# Patient Record
Sex: Female | Born: 1970
Health system: Southern US, Community
[De-identification: ages and names within clinical notes are randomized; demographics above are authoritative.]

## PROBLEM LIST (undated history)

## (undated) DIAGNOSIS — E559 Vitamin D deficiency, unspecified: Secondary | ICD-10-CM

## (undated) DIAGNOSIS — M069 Rheumatoid arthritis, unspecified: Secondary | ICD-10-CM

## (undated) DIAGNOSIS — D649 Anemia, unspecified: Secondary | ICD-10-CM

## (undated) DIAGNOSIS — M797 Fibromyalgia: Secondary | ICD-10-CM

## (undated) DIAGNOSIS — M1990 Unspecified osteoarthritis, unspecified site: Secondary | ICD-10-CM

## (undated) DIAGNOSIS — Z9889 Other specified postprocedural states: Secondary | ICD-10-CM

## (undated) DIAGNOSIS — J189 Pneumonia, unspecified organism: Secondary | ICD-10-CM

## (undated) DIAGNOSIS — I10 Essential (primary) hypertension: Secondary | ICD-10-CM

## (undated) DIAGNOSIS — R112 Nausea with vomiting, unspecified: Secondary | ICD-10-CM

## (undated) DIAGNOSIS — F32A Depression, unspecified: Secondary | ICD-10-CM

## (undated) DIAGNOSIS — F419 Anxiety disorder, unspecified: Secondary | ICD-10-CM

## (undated) HISTORY — DX: Fibromyalgia: M79.7

## (undated) HISTORY — DX: Rheumatoid arthritis, unspecified: M06.9

## (undated) HISTORY — DX: Vitamin D deficiency, unspecified: E55.9

## (undated) HISTORY — PX: TENNIS ELBOW RELEASE/NIRSCHEL PROCEDURE: SHX6651

## (undated) HISTORY — PX: CARPAL TUNNEL RELEASE: SHX101

## (undated) HISTORY — DX: Unspecified osteoarthritis, unspecified site: M19.90

## (undated) HISTORY — PX: HERNIA REPAIR: SHX51

## (undated) HISTORY — PX: SHOULDER SURGERY: SHX246

---

## 1997-04-03 HISTORY — PX: CHOLECYSTECTOMY: SHX55

## 2001-04-03 HISTORY — PX: GASTRIC BYPASS: SHX52

## 2005-12-23 ENCOUNTER — Ambulatory Visit: Payer: Self-pay | Admitting: Cardiology

## 2006-04-03 HISTORY — PX: PANNICULECTOMY: SHX5360

## 2007-03-29 ENCOUNTER — Emergency Department (HOSPITAL_COMMUNITY): Admission: EM | Admit: 2007-03-29 | Discharge: 2007-03-29 | Payer: Self-pay | Admitting: Emergency Medicine

## 2008-04-03 HISTORY — PX: APPENDECTOMY: SHX54

## 2009-12-05 ENCOUNTER — Emergency Department (HOSPITAL_COMMUNITY): Admission: EM | Admit: 2009-12-05 | Discharge: 2009-12-06 | Payer: Self-pay | Admitting: Emergency Medicine

## 2009-12-05 ENCOUNTER — Encounter: Payer: Self-pay | Admitting: Cardiology

## 2009-12-05 ENCOUNTER — Ambulatory Visit: Payer: Self-pay | Admitting: Advanced Practice Midwife

## 2009-12-05 ENCOUNTER — Ambulatory Visit: Payer: Self-pay | Admitting: Vascular Surgery

## 2009-12-08 ENCOUNTER — Encounter: Payer: Self-pay | Admitting: Cardiology

## 2009-12-30 ENCOUNTER — Ambulatory Visit: Payer: Self-pay | Admitting: Cardiology

## 2009-12-30 DIAGNOSIS — D509 Iron deficiency anemia, unspecified: Secondary | ICD-10-CM | POA: Insufficient documentation

## 2009-12-30 DIAGNOSIS — R930 Abnormal findings on diagnostic imaging of skull and head, not elsewhere classified: Secondary | ICD-10-CM | POA: Insufficient documentation

## 2009-12-30 DIAGNOSIS — R0989 Other specified symptoms and signs involving the circulatory and respiratory systems: Secondary | ICD-10-CM

## 2009-12-30 DIAGNOSIS — R0609 Other forms of dyspnea: Secondary | ICD-10-CM

## 2009-12-31 ENCOUNTER — Telehealth (INDEPENDENT_AMBULATORY_CARE_PROVIDER_SITE_OTHER): Payer: Self-pay | Admitting: *Deleted

## 2010-01-03 ENCOUNTER — Encounter: Payer: Self-pay | Admitting: Cardiology

## 2010-01-04 ENCOUNTER — Encounter: Payer: Self-pay | Admitting: Cardiology

## 2010-01-13 ENCOUNTER — Telehealth (INDEPENDENT_AMBULATORY_CARE_PROVIDER_SITE_OTHER): Payer: Self-pay | Admitting: *Deleted

## 2010-01-13 ENCOUNTER — Encounter: Payer: Self-pay | Admitting: Cardiology

## 2010-01-24 ENCOUNTER — Encounter: Payer: Self-pay | Admitting: Cardiology

## 2010-02-01 ENCOUNTER — Ambulatory Visit: Payer: Self-pay | Admitting: Cardiology

## 2010-02-01 DIAGNOSIS — E559 Vitamin D deficiency, unspecified: Secondary | ICD-10-CM

## 2010-02-01 DIAGNOSIS — E538 Deficiency of other specified B group vitamins: Secondary | ICD-10-CM | POA: Insufficient documentation

## 2010-05-03 NOTE — Progress Notes (Signed)
Summary: anemia follow up   Phone Note Outgoing Call   Summary of Call: Spoke with pt this morning.  Stated she was still feeling tired, but maybe a little better.   Now has another problem with a hernia and is going to see MD this afternoon.  She has not gotten labs yet, but will go today or tomorrow.   Has follow up scheduled for 10/28.  Hoover Brunette, LPN  January 13, 2010 1:39 PM   Follow-up for Phone Call        OK Follow-up by: Lewayne Bunting, MD, [facility],  January 14, 2010 11:54 AM

## 2010-05-03 NOTE — Miscellaneous (Signed)
Summary: Orders Update - Vit D, intact PTH level  Clinical Lists Changes  Orders: Added new Test order of T- * Misc. Laboratory test [phone]) - Signed

## 2010-05-03 NOTE — Assessment & Plan Note (Signed)
Summary: f67m  --agh   Visit Type:  Follow-up Primary Provider:  Margo Hammond   History of Present Illness: the patient is a 40 year old female who was referred initially for history of dyspnea. The patient was diagnosed with severe anemia and multiple micro-nutritional deficiencies including vitamin D. and vitamin B12 deficiency. The patient was given intravenous iron on 2 separate occasions. Her hemoglobin is now 11.2. She feels dramatically improved. Her symptoms of headache and ice craving have disappeared. Her nutritional deficiencies or the setting of a prior Roux-en-Y gastric bypass surgery without adequate vitamin and iron supplementation. The patient has not taken Vitron C., vitamin B12 injections and is status post intravenous iron. Her PTH level was within normal limits. Her vitamin D3. level remains low and patient is taking Caltrate 2 times a day.  The patient is also scheduled for hernia repair due to ongoing symptoms of abdominal pain.  Preventive Screening-Counseling & Management  Alcohol-Tobacco     Smoking Status: never  Current Medications (verified): 1)  Caltrate 600+d 600-400 Mg-Unit Tabs (Calcium Carbonate-Vitamin D) .... Take 1 Tablet By Mouth Two Times A Day 2)  Valtrex 1 Gm Tabs (Valacyclovir Hcl) .... Take 1 Tablet By Mouth Once A Day Use As Directed 3)  Maxzide-25 37.5-25 Mg Tabs (Triamterene-Hctz) .... Take 1 Tablet By Mouth Once A Day 4)  Vitamin C 500 Mg Tabs (Ascorbic Acid) .... Take 1 Tablet By Mouth Once A Day 5)  Folic Acid 1 Mg Tabs (Folic Acid) .... Take 1 Tablet By Mouth Once A Day 6)  Centrum Silver  Tabs (Multiple Vitamins-Minerals) .... Take 1 Tablet By Mouth Once A Day 7)  Vitron-C 200-125 Mg Tabs (Ferrous Fumarate-Vitamin C) .... Take 1 Tablet By Mouth Once A Day 8)  Cyanocobalamin 1000 Mcg/ml Soln (Cyanocobalamin) .... Inject 1ml Daily X 5 Days (Monday - Friday This Week), Then 1ml Every Week X 4 Weeks, Then  Call Office 9)  Syringes .... Use As Directed  For Vitamin B12 Injections  Allergies (verified): No Known Drug Allergies  Comments:  Nurse/Medical Assistant: The patient's medications and allergies were verbally reviewed with the patient and were updated in the Medication and Allergy Lists.  Past History:  Past Surgical History: Last updated: 12/30/2009 Laparoscopic gastric bypass @ [facility] 2003 Abdomicoplasty 12-2005  Family History: Last updated: 12/30/2009 Father is alive 72 yrs arthritis,hypertension but still working fulltime. Amanda Hammond is alive 47 yrs obesity,DM,HTN Siblings: 2 brothers living with HTn  Social History: Last updated: 12/30/2009 Married  Alcohol Use - no Drug Use - no  Risk Factors: Smoking Status: never (02/01/2010)  Past Medical History: Pulmonary embolus HTN with increased weight Abdominal pain Shortness of breath Vitamin D deficiency Vitamin B12 deficiency Severe iron deficiency anemia requiring intravenous iron  Review of Systems  The patient denies fatigue, malaise, fever, weight gain/loss, vision loss, decreased hearing, hoarseness, chest pain, palpitations, shortness of breath, prolonged cough, wheezing, sleep apnea, coughing up blood, abdominal pain, blood in stool, nausea, vomiting, diarrhea, heartburn, incontinence, blood in urine, muscle weakness, joint pain, leg swelling, rash, skin lesions, headache, fainting, dizziness, depression, anxiety, enlarged lymph nodes, easy bruising or bleeding, and environmental allergies.    Vital Signs:  Patient profile:   40 year old female Height:      66 inches Weight:      213 pounds Pulse rate:   73 / minute BP sitting:   153 / 97  (left arm) Cuff size:   large  Vitals Entered By: Amanda Hammond (February 01, 2010  8:46 AM)  Serial Vital Signs/Assessments:  Time      Position  BP       Pulse  Resp  Temp     By 8:48 AM             151/86   69                    Amanda Hammond  Comments: 8:48 AM recheck large cuff/left arm By: Amanda Hammond    Physical Exam  Additional Exam:  General: well-nourished appearing white female head: Normocephalic and atraumatic eyes PERRLA/EOMI intact, conjunctiva and lids normal nose: No deformity or lesions mouth normal dentition, normal posterior pharynx neck: Supple, no JVD.  No masses, thyromegaly or abnormal cervical nodes lungs: Normal breath sounds bilaterally without wheezing.  Normal percussion heart: regular rate and rhythm with normal S1 and S2, no S3 or S4.  PMI is normal.  No pathological murmurs abdomen: Normal bowel sounds, abdomen is soft and nontender without masses, organomegaly or hernias noted.  No hepatosplenomegaly musculoskeletal: Back normal, normal gait muscle strength and tone normal pulsus: Pulse is normal in all 4 extremities Extremities: No peripheral pitting edema neurologic: Alert and oriented x 3 skin: Intact without lesions or rashes cervical nodes: No significant adenopathy psychologic: Normal affect    Impression & Recommendations:  Problem # 1:  ANEMIA, IRON DEFICIENCY (ICD-280.9) iron deficiency is dramatically improved resolution of symptoms however MCV remains low as well as a markedly increased RDW suggesting residual iron deficiency anemia, continue Fe supplementation. The patient may need more IV iron. Ferritin to be followed closely.  She will follow up with Dr. Margo Hammond as well as the Amanda Hammond to check her iron studies.  Problem # 2:  OTHER DYSPNEA AND RESPIRATORY ABNORMALITIES (ICD-786.09) resolving secondary to iron deficiency Her updated medication list for this problem includes:    Maxzide-25 37.5-25 Mg Tabs (Triamterene-hctz) .Marland Kitchen... Take 1 tablet by mouth once a day  Problem # 3:  B12 DEFICIENCY (ICD-266.2) patient is receiving hydroxocobalamin injections.  Problem # 4:  VITAMIN D DEFICIENCY (ICD-268.9) continue vitamin D q.d. supplementation. Follow up with Dr. Margo Hammond.  Patient Instructions: 1)  Decrease Caltrate to two times  a day  2)  Vitron C - buy otc 3)  Follow up as needed

## 2010-05-03 NOTE — Letter (Signed)
Summary: Internal Other/ PATIENT HISTORY FORM  Internal Other/ PATIENT HISTORY FORM   Imported By: Dorise Hiss 01/06/2010 14:51:44  _____________________________________________________________________  External Attachment:    Type:   Image     Comment:   External Document

## 2010-05-03 NOTE — Letter (Signed)
Summary: External Correspondence/ [facility]  External Correspondence/ [facility]   Imported By: Dorise Hiss 12/16/2009 15:37:59  _____________________________________________________________________  External Attachment:    Type:   Image     Comment:   External Document

## 2010-05-03 NOTE — Miscellaneous (Signed)
Summary: rx - vit b12 & syringes  Clinical Lists Changes  Medications: Added new medication of FOLIC ACID 1 MG TABS (FOLIC ACID) Take 1 tablet by mouth once a day - Signed Added new medication of CENTRUM SILVER  TABS (MULTIPLE VITAMINS-MINERALS) Take 1 tablet by mouth once a day Added new medication of VITRON-C 200-125 MG TABS (FERROUS FUMARATE-VITAMIN C) Take 1 tablet by mouth once a day Added new medication of CYANOCOBALAMIN 1000 MCG/ML SOLN (CYANOCOBALAMIN) inject 1ml daily x 5 days (Monday - Friday this week), then 1ml every week x 4 weeks, then  call office - Signed Added new medication of * SYRINGES use as directed for Vitamin B12 injections - Signed Rx of FOLIC ACID 1 MG TABS (FOLIC ACID) Take 1 tablet by mouth once a day;  #30 x 6;  Signed;  Entered by: Hoover Brunette, LPN;  Authorized by: Lewayne Bunting, MD, [facility];  Method used: Electronically to [facility]*, [address] [place], [place], [place]  [address], Ph: [phone], Fax: [phone] Rx of CYANOCOBALAMIN 1000 MCG/ML SOLN (CYANOCOBALAMIN) inject 1ml daily x 5 days (Monday - Friday this week), then 1ml every week x 4 weeks, then  call office;  #1 x 2;  Signed;  Entered by: Hoover Brunette, LPN;  Authorized by: Lewayne Bunting, MD, [facility];  Method used: Electronically to [facility]*, [address], [place], [place], [place]  [address], Ph: [phone], Fax: [phone] Rx of SYRINGES use as directed for Vitamin B12 injections;  #QS x 1;  Signed;  Entered by: Hoover Brunette, LPN;  Authorized by: Lewayne Bunting, MD, [facility];  Method used: Faxed to [facility]*, [address], [place], [place], [place]  [address], Ph: [phone], Fax: [phone]    Prescriptions: SYRINGES use as directed for Vitamin B12 injections  #QS x 1   Entered by:   Hoover Brunette, LPN   Authorized by:   Lewayne Bunting, MD, [facility]   Signed by:   Hoover Brunette, LPN on 43/32/9518   Method used:   Faxed to ...       [facility]* (retail)    [address]       [place], [place]  [address]       Ph: [phone]       Fax: [phone]   RxID:   [id number] CYANOCOBALAMIN 1000 MCG/ML SOLN (CYANOCOBALAMIN) inject 1ml daily x 5 days (Monday - Friday this week), then 1ml every week x 4 weeks, then  call office  #1 x 2   Entered by:   Hoover Brunette, LPN   Authorized by:   Lewayne Bunting, MD, [facility]   Signed by:   Hoover Brunette, LPN on 17/61/6073   Method used:   Electronically to        [facility]* (retail)       [address]       [place], [place]  [address]       Ph: [phone]       Fax: [phone]   RxID:   [id number] FOLIC ACID 1 MG TABS (FOLIC ACID) Take 1 tablet by mouth once a day  #30 x 6   Entered by:   Hoover Brunette, LPN   Authorized by:   Lewayne Bunting, MD, [facility]   Signed by:   Hoover Brunette, LPN on 75/01/2584   Method used:  Electronically to        [facility]* (retail)       [address]       [place], [place]  [address]       Ph: [phone]       Fax: [phone]   RxID:   [id number]

## 2010-05-03 NOTE — Miscellaneous (Signed)
Summary: Orders Update  Clinical Lists Changes  Orders: Added new Test order of T-CBC w/Diff ([id number]) - Signed 

## 2010-05-03 NOTE — Progress Notes (Signed)
Summary: PERCERT   ---- Converted from flag ---- ---- 12/31/2009 12:54 PM, Era Bumpers wrote: No precert req.for this procedure.  ---- 12/31/2009 12:03 PM, Zachary George wrote: does this need percert?  ---- 78/29/5621 10:50 AM, Carlye Grippe wrote: The following orders have been entered for this patient and placed on Admin Hold:  Type:     Referral       Code:   Misc. Ref Description:   Misc. Referral Order Date:   12/30/2009   Authorized By:   Lewayne Bunting, MD, [facility] Order #:   [id number] Clinical Notes:   Type of Referral: FERAHEME(FERUMOXYTOL) 510 MG IV INJECTION TIMES ONE @ [facility] NOW ------------------------------

## 2010-05-03 NOTE — Assessment & Plan Note (Signed)
Summary: NP-RECENT ONSET EXERTIONAL SOB   Visit Type:  Initial Consult Primary Provider:  Tapper   History of Present Illness: the patient is a 40 year old female spouse of Nychelle Cassata. The patient is a phlebotomist. She has been referred for a 6 month history of dyspnea. Reportedly she has been diagnosed with a couple of episodes of pneumonia respectively in October and March as well as more recently when she was seen in the colon emergency room several weeks ago. I reviewed with the patient her CT scan of the chest and was found to have a small infiltrate and was treated with Rocephin and  azithromycin. At that time she had associated pleuritic chest pain, the latter has resolved. However over the last several weeks although this has clearly been dating back for several months she complains of shortness of breath on minimal exertion associated with a tightness in the upper chest. She has been very concerned because her shortness of breath occurs on minimal exertion. She has rare palpitations. She had a marked decrease in her exercise tolerance and feels that she gets out just a short period of time. The patient is visibly pale appearing  I obtained all the data from [facility] and was quite surprised to see that the patient had hemoglobin of 7.4 with an MCV of 66, values that were not discussed with the patient. Of note is that the patient has a history of Roux-en-Y gastric bypass surgery at [facility] several years ago but has not routinely received vitamin B12 injections or other micronutritional supplements. The patient has symptoms of dumping syndrome related to her prior gastric bypass surgery.  She has no definite prior cardiac history she has had no prior stress testing.  The patient reports severe ice craving and burning sensation of her tongue. She has frequent headaches.  Preventive Screening-Counseling & Management  Alcohol-Tobacco     Smoking Status: never  Current Medications  (verified): 1)  Caltrate 600+d 600-400 Mg-Unit Tabs (Calcium Carbonate-Vitamin D) .... Take 1 Tablet By Mouth Three Times A Day 2)  Valtrex 1 Gm Tabs (Valacyclovir Hcl) .... Take 1 Tablet By Mouth Once A Day Use As Directed 3)  Maxzide-25 37.5-25 Mg Tabs (Triamterene-Hctz) .... Take 1 Tablet By Mouth Once A Day 4)  Vitamin C 500 Mg Tabs (Ascorbic Acid) .... Take 1 Tablet By Mouth Once A Day  Allergies (verified): No Known Drug Allergies  Comments:  Nurse/Medical Assistant: The patient's medications and allergies were verbally reviewed with the patient and were updated in the Medication and Allergy Lists.  Past History:  Past Medical History: Last updated: 12/30/2009 Pulmonary embolus HTN with increased weight Abdominal pain Shortness of breath  Past Surgical History: Last updated: 12/30/2009 Laparoscopic gastric bypass @ [facility] 2003 Abdomicoplasty 12-2005  Family History: Last updated: 12/30/2009 Father is alive 72 yrs arthritis,hypertension but still working fulltime. Mothe is alive 75 yrs obesity,DM,HTN Siblings: 2 brothers living with HTn  Social History: Last updated: 12/30/2009 Married  Alcohol Use - no Drug Use - no  Social History: Smoking Status:  never  Review of Systems       The patient complains of fatigue, chest pain, shortness of breath, and muscle weakness.  The patient denies malaise, fever, weight gain/loss, vision loss, decreased hearing, hoarseness, palpitations, prolonged cough, wheezing, sleep apnea, coughing up blood, abdominal pain, blood in stool, nausea, vomiting, diarrhea, heartburn, incontinence, blood in urine, joint pain, leg swelling, rash, skin lesions, headache, fainting, dizziness, depression, anxiety, enlarged lymph nodes, easy bruising  or bleeding, and environmental allergies.    Vital Signs:  Patient profile:   40 year old female Height:      66 inches Weight:      211 pounds BMI:     34.18 Pulse rate:   81 / minute BP sitting:    151 / 98  (left arm) Cuff size:   large  Vitals Entered By: Carlye Grippe (December 30, 2009 1:49 PM)  Nutrition Counseling: Patient's BMI is greater than 25 and therefore counseled on weight management options.  Physical Exam  Additional Exam:  General: pale-appearing white female head: Normocephalic and atraumatic eyes PERRLA/EOMI intact, conjunctiva and lids normal nose: No deformity or lesions mouth normal dentition, normal posterior pharynx neck: Supple, no JVD.  No masses, thyromegaly or abnormal cervical nodes lungs: Normal breath sounds bilaterally without wheezing.  Normal percussion heart: regular rate and rhythm with normal S1 and S2, no S3 or S4.  PMI is normal.  No pathological murmurs abdomen: Normal bowel sounds, abdomen is soft and nontender without masses, organomegaly or hernias noted.  No hepatosplenomegaly musculoskeletal: Back normal, normal gait muscle strength and tone normal pulsus: Pulse is normal in all 4 extremities Extremities: No peripheral pitting edema neurologic: Alert and oriented x 3 skin: Intact without lesions or rashes cervical nodes: No significant adenopathy psychologic: Normal affect    Impression & Recommendations:  Problem # 1:  ANEMIA, IRON DEFICIENCY (ICD-280.9) based on laboratory work from [facility] the patient has severe iron deficiency anemia. I suspect this will be multifactorial given her intestinal malabsorption related to Roux-en-Y gastric bypass surgery. ferritin level, vitamin B12 ionized calcium and thiamine levels will be obtained.we'll also obtain 25 hydroxy vitamin D levels.patient will need guaiac stools. The patient reports that she is unable to take oral iron preparations. We will refer her for intravenous iron either with Feraheme for iron sucrose. She will also need vitamin B12 injections Orders: T-Folic Acid; RBC ([id number]) T- * Misc. Laboratory test [id number]) T-Calcium, Ionized [phone]) T- * Misc.  Laboratory test [phone]) T-Sed Rate (Automated) [phone]) T-Iron [phone]) T-Ferritin [phone]) T-Vitamin B12 ([id number])Future Orders: Misc. Referral (Misc. Ref) ... 12/31/2009  Problem # 2:  OTHER DYSPNEA AND RESPIRATORY ABNORMALITIES (ICD-786.09) I suspect the patient's dyspnea is related to her severe anemia. At this point I do not recommend any stress testing. Her updated medication list for this problem includes:    Maxzide-25 37.5-25 Mg Tabs (Triamterene-hctz) .Marland Kitchen... Take 1 tablet by mouth once a day  Orders: T-Folic Acid; RBC ([id number]) T- * Misc. Laboratory test [phone]) T-Calcium, Ionized [phone]) T- * Misc. Laboratory test [id number]) T-Sed Rate (Automated) [phone]) T-Iron [phone]) T-Ferritin [phone]) T-Vitamin B12 ([id number])Future Orders: Misc. Referral (Misc. Ref) ... 12/31/2009  Problem # 3:  COMPUTERIZED TOMOGRAPHY, CHEST, ABNORMAL (ICD-793.1) the patient will need a followup CT scan of some point in the future. Showed an abnormal infiltrate. Certainly this could represent community-acquired pneumonia which had several episodes in the last 6-7 months. This may well be due to decreased immune function in the setting of her micronutrient deficiency. Nevertheless followup CT scan in 6 months may be prudent  Other Orders: T-CBC w/Diff ([id number]) T-Reticulocyte Count, Manual (73710)  Patient Instructions: 1)  Vitamin C 500mg  daily 2)  Labs today 3)  Follow up in  1 month

## 2010-05-03 NOTE — Miscellaneous (Signed)
Summary: Orders Update - day hospital  Clinical Lists Changes  Orders: Added new Referral order of Misc. Referral (Misc. Ref) - Signed

## 2010-06-16 LAB — BASIC METABOLIC PANEL
BUN: 5 mg/dL — ABNORMAL LOW (ref 6–23)
Calcium: 7.9 mg/dL — ABNORMAL LOW (ref 8.4–10.5)
Creatinine, Ser: 0.51 mg/dL (ref 0.4–1.2)
GFR calc non Af Amer: >60 mL/min (ref >60)
Glucose, Bld: 67 mg/dL — ABNORMAL LOW (ref 70–99)

## 2010-06-16 LAB — DIFFERENTIAL
Basophils Relative: 0 % (ref 0–1)
Eosinophils Absolute: 0.1 10*3/uL (ref 0.0–0.7)
Monocytes Absolute: 0.5 10*3/uL (ref 0.1–1.0)
Neutrophils Relative %: 68 % (ref 43–77)

## 2010-06-16 LAB — URINALYSIS, ROUTINE W REFLEX MICROSCOPIC
Ketones, ur: NEGATIVE mg/dL
Nitrite: NEGATIVE
Protein, ur: NEGATIVE mg/dL

## 2010-06-16 LAB — CBC
MCHC: 28.8 g/dL — ABNORMAL LOW (ref 30.0–36.0)
Platelets: 285 10*3/uL (ref 150–400)
RDW: 17.2 % — ABNORMAL HIGH (ref 11.5–15.5)

## 2010-06-16 LAB — HEPATIC FUNCTION PANEL
Alkaline Phosphatase: 84 U/L (ref 39–117)
Indirect Bilirubin: 0.2 mg/dL — ABNORMAL LOW (ref 0.3–0.9)
Total Protein: 6.4 g/dL (ref 6.0–8.3)

## 2010-06-16 LAB — LIPASE, BLOOD: Lipase: 34 U/L (ref 11–59)

## 2010-06-16 LAB — GLUCOSE, CAPILLARY: Glucose-Capillary: 63 mg/dL — ABNORMAL LOW (ref 70–99)

## 2010-06-16 LAB — D-DIMER, QUANTITATIVE: D-Dimer, Quant: <0.22 ug/mL-FEU (ref 0.00–0.48)

## 2010-08-11 ENCOUNTER — Encounter (HOSPITAL_COMMUNITY): Payer: PRIVATE HEALTH INSURANCE

## 2010-08-11 ENCOUNTER — Other Ambulatory Visit: Payer: Self-pay | Admitting: General Surgery

## 2010-08-11 LAB — CBC
MCH: 24.3 pg — ABNORMAL LOW (ref 26.0–34.0)
MCV: 77.9 fL — ABNORMAL LOW (ref 78.0–100.0)
Platelets: 357 10*3/uL (ref 150–400)
RBC: 5.07 MIL/uL (ref 3.87–5.11)

## 2010-08-11 LAB — BASIC METABOLIC PANEL
BUN: 9 mg/dL (ref 6–23)
Chloride: 101 mEq/L (ref 96–112)
Glucose, Bld: 82 mg/dL (ref 70–99)
Potassium: 3.9 mEq/L (ref 3.5–5.1)

## 2010-08-12 NOTE — H&P (Signed)
  NAMEHARMANI, NETO NO.:  [id number]  MEDICAL RECORD NO.:  [id number]           PATIENT TYPE:  LOCATION:  DAY                           FACILITY:  [facility]  PHYSICIAN:  Dalia Heading, M.D.  DATE OF BIRTH:  20-Mar-1971  DATE OF ADMISSION: DATE OF DISCHARGE:  LH                             HISTORY & PHYSICAL   CHIEF COMPLAINT:  Recurrent incisional hernia.  HISTORY OF PRESENT ILLNESS:  The patient is a 40 year old white female status post an incisional herniorrhaphy with mesh at [facility] in November 2011 who now presents with swelling and pain at the incision site.  She states that it is made worse with straining.  She denies nausea or vomiting.  PAST MEDICAL HISTORY:  Hypertension and anxiety.  PAST SURGICAL HISTORY:  Gastric bypass surgery in the remote past and incisional herniorrhaphy with mesh in November 2011.  CURRENT MEDICATIONS: 1. Wellbutrin XL 150 mg p.o. daily. 2. Triamterene/hydrochlorothiazide 1 tablet p.o. daily. 3. Multivitamin 1 tablet p.o. daily.  ALLERGIES:  No known drug allergies.  REVIEW OF SYSTEMS:  The patient denies drinking, smoking, or intravenous drug use.  FAMILY MEDICAL HISTORY:  Noncontributory.  PHYSICAL EXAMINATION:  GENERAL:  The patient is an overweight white female in no acute distress. HEENT:  Unremarkable. NECK:  Supple without lymphadenopathy. LUNGS:  Clear to auscultation with equal breath sounds bilaterally. HEART:  Regular rate and rhythm without S3, S4, or murmurs. ABDOMEN:  Soft with an upper midline incision that is well healed. Significant bulging is noted when she strains or stands.  It is easily reducible.  It is tender to touch.  No hepatosplenomegaly or masses are noted.  IMPRESSION:  Recurrent incisional hernia.  PLAN:  The patient is scheduled for recurrent incisional herniorrhaphy with mesh on Aug 15, 2010.  Risks and benefits of the procedure including bleeding, infection,  pain, and possibly recurrence of the hernia were fully explained to the patient, gave informed consent.     Dalia Heading, M.D.     MAJ/MEDQ  D:  07/28/2010  T:  07/29/2010  Job:  [id number]  cc:   Wyvonnia Lora Fax: [phone]  Short Stay at [facility]  Electronically Signed by Franky Macho M.D. on 08/12/2010 10:51:31 AM

## 2010-08-15 ENCOUNTER — Ambulatory Visit (HOSPITAL_COMMUNITY)
Admission: RE | Admit: 2010-08-15 | Discharge: 2010-08-15 | Disposition: A | Discharge status: Home / Self Care | Payer: PRIVATE HEALTH INSURANCE | Source: Ambulatory Visit | Attending: General Surgery | Admitting: General Surgery

## 2010-08-15 DIAGNOSIS — I10 Essential (primary) hypertension: Secondary | ICD-10-CM | POA: Insufficient documentation

## 2010-08-15 DIAGNOSIS — K432 Incisional hernia without obstruction or gangrene: Secondary | ICD-10-CM | POA: Insufficient documentation

## 2010-08-15 DIAGNOSIS — Z01812 Encounter for preprocedural laboratory examination: Secondary | ICD-10-CM | POA: Insufficient documentation

## 2010-08-15 DIAGNOSIS — Z79899 Other long term (current) drug therapy: Secondary | ICD-10-CM | POA: Insufficient documentation

## 2010-08-17 NOTE — Op Note (Signed)
  [name], Amanda Hammond NO.:  [id number]  MEDICAL RECORD NO.:  [id number]           PATIENT TYPE:  O  LOCATION:  DAYP                          FACILITY:  [facility]  PHYSICIAN:  Dalia Heading, M.D.  DATE OF BIRTH:  [birth year]  DATE OF PROCEDURE:  08/15/2010 DATE OF DISCHARGE:                              OPERATIVE REPORT   PREOPERATIVE DIAGNOSIS:  Recurrent incisional hernia.  POSTOPERATIVE DIAGNOSIS:  Recurrent incisional hernia.  PROCEDURE:  Recurrent incisional herniorrhaphy with mesh.  SURGEON:  Dalia Heading, MD.  ANESTHESIA:  General endotracheal.  INDICATIONS:  The patient is a 40 year old white female, status post incisional herniorrhaphy with mesh in November 2011, who presents with swelling and pain at the incision site.  She has recurrent incisional hernia, now presents for recurrent incisional herniorrhaphy with mesh. The risks and benefits of the procedure including bleeding, infection, pain, the possibly recurrence of the hernia were fully explained to the patient, gave informed consent.  PROCEDURE NOTE:  The patient was placed in the supine position.  After induction of general endotracheal anesthesia, the abdomen was prepped and draped using the usual sterile technique with DuraPrep.  Surgical site confirmation was performed.  A midline incision was made.  A previous surgical scar was excised and disposed.  The dissection was taken down to the hernia and mesh. Appeared that the mesh had pulled away from the inferior aspect of the fascia.  There was a deeper level of fascia present.  The old mesh was excised.  Any omental attachments to the anterior abdominal wall were lysed.  This was in order to facilitate placement of new mesh.  The Ethicon Physiomesh 10 x 15 cm was then inserted and tacked to the abdominal wall using 2-0 Prolene interrupted sutures.  In order to facilitate a double closure, the fascia overlying the mesh was then closed  using 0 Ethibond interrupted sutures.  Subcutaneous layer was reapproximated using 2-0 Vicryl interrupted sutures.  The skin was closed using staples.  A 0.5% Sensorcaine was instilled into the surrounding wound.  Betadine ointment and dry sterile dressings were applied.  All tape and needle counts were correct at the end of the procedure. The patient was extubated in the operating room and went back to recovery room, awake in stable condition.  COMPLICATIONS:  None.  SPECIMEN:  None.  BLOOD LOSS:  Minimal.     Dalia Heading, M.D.     MAJ/MEDQ  D:  08/15/2010  T:  08/15/2010  Job:  [id number]  cc:   Wyvonnia Lora Fax: [phone]  Electronically Signed by Franky Macho M.D. on 08/17/2010 08:51:47 AM

## 2010-10-10 ENCOUNTER — Encounter (HOSPITAL_COMMUNITY)
Admission: RE | Admit: 2010-10-10 | Discharge: 2010-10-10 | Disposition: A | Discharge status: Home / Self Care | Payer: PRIVATE HEALTH INSURANCE | Source: Ambulatory Visit | Attending: General Surgery | Admitting: General Surgery

## 2010-10-10 ENCOUNTER — Encounter (HOSPITAL_COMMUNITY): Payer: Self-pay

## 2010-10-10 HISTORY — DX: Other specified postprocedural states: Z98.890

## 2010-10-10 HISTORY — DX: Anemia, unspecified: D64.9

## 2010-10-10 HISTORY — DX: Essential (primary) hypertension: I10

## 2010-10-10 HISTORY — DX: Nausea with vomiting, unspecified: R11.2

## 2010-10-10 LAB — BASIC METABOLIC PANEL
CO2: 28 mEq/L (ref 19–32)
Calcium: 8.9 mg/dL (ref 8.4–10.5)
GFR calc non Af Amer: >60 mL/min (ref >60)
Sodium: 141 mEq/L (ref 135–145)

## 2010-10-10 LAB — CBC
Platelets: 356 10*3/uL (ref 150–400)
RBC: 4.56 MIL/uL (ref 3.87–5.11)
WBC: 8.6 10*3/uL (ref 4.0–10.5)

## 2010-10-10 LAB — SURGICAL PCR SCREEN
MRSA, PCR: POSITIVE — AB
Staphylococcus aureus: POSITIVE — AB

## 2010-10-10 NOTE — Patient Instructions (Addendum)
[address]  10/10/2010   Your procedure is scheduled on: Wednesday,  10/12/10  Report to [facility] at 11:30 AM.  Call this number if you have problems the morning of surgery: [phone]   Remember:   Do not eat food:After Midnight.  Do not drink clear liquids: After Midnight.  Take these medicines the morning of surgery with A SIP OF WATER: wellbutrin, maxzide    Do not wear jewelry, make-up or nail polish.  Do not bring valuables to the hospital.  Contacts, dentures or bridgework may not be worn into surgery.  Leave suitcase in the car. After surgery it may be brought to your room.  For patients admitted to the hospital, checkout time is 11:00 AM the day of discharge.   Patients discharged the day of surgery will not be allowed to drive home.  Name and phone number of your driver: Bill  Special Instructions: CHG Shower Shower 2 days before surgery and 1 day before surgery with Hibiclens.   Please read over the following fact sheets that you were given: Pain Booklet, Coughing and Deep Breathing, MRSA Information, Surgical Site Infection Prevention and Anesthesia Post-op Instructions General Instructions for Surgery These instructions are generic. They cover multiple surgeries and are a general pre-surgical guideline. They do not apply to all procedures. You may have questions. Answers will be provided by your caregiver. PREPARING FOR SURGERY  Stop smoking at least two weeks prior to surgery. This lowers risk during surgery. Ask your caregiver for help with this if needed. The benefits are well worth it. This is a good time for a healthy lifestyle change.   Your caregiver may advise that you stop taking certain medications that may affect the outcome of the surgery and your ability to heal. For example, you may need to stop taking anti-inflammatories, such as aspirin or ibuprofen because of possible bleeding problems. Other medications may have interactions with anesthesia.   BE  SURE TO LET YOUR CAREGIVER KNOW IF YOU HAVE BEEN ON STEROIDS FOR LONG PERIODS OF TIME. THIS IS CRITICAL.   Your caregiver will discuss possible risks and complications with you before surgery. In addition to the usual risks of anesthesia, other common risks and complications include blood loss and replacement (not applicable to minor surgical procedures), temporary increase in pain due to surgery, uncorrected pain or problems the surgery was meant to correct, infection, or new damage.  BEFORE SURGERY Arrive @ 11:30 . Check in at the admissions desk to fill out necessary forms if you are not pre-registered. There will be consent forms to sign prior to the procedure. There is a waiting area for your family while you are having your procedure.  LET YOUR CAREGIVERS KNOW ABOUT THE FOLLOWING:  Allergies.  Medications taken including herbs, eye drops, over the counter medications, and creams.   Use of steroids (by mouth or creams).   Previous problems with anesthetics or novocaine.   Possibility of pregnancy, if this applies.  History of blood clots (thrombophlebitis).   History of bleeding or blood problems.   Previous surgery.   Other health problems.   FOLLOWING SURGERY You will be taken to the recovery area where a nurse will watch and check your progress. Once you are awake, stable, and taking fluids well, barring other problems you will be allowed to go home. Once home, an ice pack wrapped in a light towel applied to your operative site may help with discomfort and keep the swelling down. Follow instructions as  suggested by your caregiver. HOME CARE INSTRUCTIONS  Follow your caregiver's instructions as to activities, exercises, physical therapy, or driving a car.   Weight reduction may be helpful depending upon the type of surgery.   Daily exercise is helpful. Maintain strength and range of motion as instructed.   Only take over-the-counter or prescription medicines for pain,  discomfort, or fever as directed by your caregiver.  SEEK MEDICAL CARE IF:  You notice increased bleeding (more than a small spot) from the wound.   You soak more than four pads following a uterine procedure unless instructed otherwise.   There is redness, swelling, or increasing pain in the wound.   You notice pus coming from wound.   An unexplained oral temperature over 101' develops, or as your caregiver suggests.   You notice a foul smell coming from the wound or dressing.   You become light headed or if you pass out (faint).  SEEK IMMEDIATE MEDICAL CARE IF:  You develop a rash.   You have difficulty breathing.   You develop any allergic problems.  Document Released: 06/26/2000 Document Re-Released: 06/16/2008 [facility] Patient Information 2011 [place], [place].

## 2010-10-10 NOTE — H&P (Signed)
Amanda Hammond is an 40 y.o. female.   Chief Complaint: *Recurrent incisional hernia** HPI: *Patient is a 40 year old white female status post incisional herniorrhaphy with mesh on several months ago who now presents with swelling and pain at the incision site. She has had previous repair of this hernia in November of 2011 at [facility].  Recently she felt something pop and has had increasing support of swelling in the incisional region. It appears that her hernia repair or has been disrupted.**  Past Medical History  Diagnosis Date  . Hypertension   . Anemia   . PONV (postoperative nausea and vomiting)     Past Surgical History  Procedure Date  . Hernia repair 08/2010, 02/2010     incisional, [facility], [facility]  . Cholecystectomy 1999    lap, [facility]  . Appendectomy 2010    [facility]  . Gastric bypass 2003    [facility]  . Panniculectomy 2008    [facility]    Family History  Problem Relation Age of Onset  . Anesthesia problems Neg Hx   . Hypotension Neg Hx   . Pseudochol deficiency Neg Hx   . Malignant hyperthermia Neg Hx    Social History:  has an unknown smoking status. She has never used smokeless tobacco. She reports that she does not drink alcohol or use illicit drugs.  Allergies: No Known Allergies  No current facility-administered medications on file as of .   No current outpatient prescriptions on file as of .    No results found for this or any previous visit (from the past 48 hour(s)). No results found.  Review of Systems  Constitutional: Negative.   HENT: Negative.   Eyes: Negative.   Respiratory: Negative.   Cardiovascular: Negative.   Gastrointestinal: Negative.   Genitourinary: Negative.   Musculoskeletal: Negative.   Skin: Negative.   Neurological: Negative.   Endo/Heme/Allergies: Negative.     Last menstrual period 10/03/2010. Physical Exam  Constitutional: She is oriented to person, place, and time. She appears well-developed and well-nourished.    HENT:  Head: Normocephalic and atraumatic.  Eyes: Pupils are equal, round, and reactive to light.  Neck: Normal range of motion. Neck supple.  Cardiovascular: Normal rate, regular rhythm and normal heart sounds.   Respiratory: Effort normal and breath sounds normal. She has no wheezes. She has no rales.  GI: Soft.       Upper midline incisional hernia noted.  Musculoskeletal: Normal range of motion.  Neurological: She is alert and oriented to person, place, and time.  Skin: Skin is warm.  Psychiatric: She has a normal mood and affect. Her behavior is normal. Thought content normal.     Assessment/Plan *Recurrent incisional hernia  The patient is scheduled for recurrent incisional herniorrhaphy with mesh on 10/12/2010. The risks and benefits of the procedure including bleeding, infection, and a possibly recurrence of the hernia were fully explained to the patient, gave informed consent.**  Amanda Hammond A 10/10/2010, 2:25 PM

## 2010-10-11 MED ORDER — CEFAZOLIN SODIUM-DEXTROSE 2-3 GM-% IV SOLR
2.0000 g | INTRAVENOUS | Status: DC
  Filled 2010-10-11: qty 50

## 2010-10-12 ENCOUNTER — Ambulatory Visit (HOSPITAL_COMMUNITY): Payer: PRIVATE HEALTH INSURANCE | Admitting: Anesthesiology

## 2010-10-12 ENCOUNTER — Encounter (HOSPITAL_COMMUNITY): Payer: Self-pay | Admitting: *Deleted

## 2010-10-12 ENCOUNTER — Encounter (HOSPITAL_COMMUNITY)
Admission: RE | Disposition: A | Discharge status: Home / Self Care | Payer: Self-pay | Source: Ambulatory Visit | Attending: General Surgery

## 2010-10-12 ENCOUNTER — Encounter (HOSPITAL_COMMUNITY): Payer: Self-pay | Admitting: Anesthesiology

## 2010-10-12 ENCOUNTER — Inpatient Hospital Stay (HOSPITAL_COMMUNITY)
Admission: RE | Admit: 2010-10-12 | Discharge: 2010-10-16 | DRG: 355 | Disposition: A | Discharge status: Home / Self Care | Payer: PRIVATE HEALTH INSURANCE | Source: Ambulatory Visit | Attending: General Surgery | Admitting: General Surgery

## 2010-10-12 DIAGNOSIS — Z01812 Encounter for preprocedural laboratory examination: Secondary | ICD-10-CM

## 2010-10-12 DIAGNOSIS — K432 Incisional hernia without obstruction or gangrene: Principal | ICD-10-CM | POA: Diagnosis present

## 2010-10-12 DIAGNOSIS — I10 Essential (primary) hypertension: Secondary | ICD-10-CM | POA: Diagnosis present

## 2010-10-12 HISTORY — PX: INCISIONAL HERNIA REPAIR: SHX193

## 2010-10-12 SURGERY — REPAIR, HERNIA, INCISIONAL
Anesthesia: General | Site: Abdomen | Wound class: Clean

## 2010-10-12 MED ORDER — FENTANYL CITRATE 0.05 MG/ML IJ SOLN
INTRAMUSCULAR | Status: AC
  Administered 2010-10-12: 50 ug via INTRAVENOUS
  Filled 2010-10-12: qty 2

## 2010-10-12 MED ORDER — PROPOFOL 10 MG/ML IV EMUL
INTRAVENOUS | Status: DC | PRN
  Administered 2010-10-12: 150 mg via INTRAVENOUS

## 2010-10-12 MED ORDER — ROCURONIUM BROMIDE 100 MG/10ML IV SOLN
INTRAVENOUS | Status: DC | PRN
  Administered 2010-10-12: 30 mg via INTRAVENOUS

## 2010-10-12 MED ORDER — SCOPOLAMINE 1 MG/3DAYS TD PT72
1.0000 | MEDICATED_PATCH | Freq: Once | TRANSDERMAL | Status: DC
  Administered 2010-10-12: 1.5 mg via TRANSDERMAL

## 2010-10-12 MED ORDER — TRIAMTERENE-HCTZ 37.5-25 MG PO TABS
1.0000 | ORAL_TABLET | Freq: Every day | ORAL | Status: DC
  Administered 2010-10-12 – 2010-10-16 (×5): 1 via ORAL
  Filled 2010-10-12 (×5): qty 1

## 2010-10-12 MED ORDER — DEXAMETHASONE SODIUM PHOSPHATE 4 MG/ML IJ SOLN
4.0000 mg | Freq: Once | INTRAMUSCULAR | Status: AC
  Administered 2010-10-12: 4 mg via INTRAVENOUS

## 2010-10-12 MED ORDER — BUPIVACAINE HCL (PF) 0.5 % IJ SOLN
INTRAMUSCULAR | Status: DC | PRN
  Administered 2010-10-12: 10 mL

## 2010-10-12 MED ORDER — FENTANYL CITRATE 0.05 MG/ML IJ SOLN
INTRAMUSCULAR | Status: AC
  Administered 2010-10-12: 50 ug via INTRAVENOUS
  Filled 2010-10-12: qty 5

## 2010-10-12 MED ORDER — ENOXAPARIN SODIUM 40 MG/0.4ML ~~LOC~~ SOLN
40.0000 mg | SUBCUTANEOUS | Status: DC
  Administered 2010-10-13 – 2010-10-16 (×4): 40 mg via SUBCUTANEOUS
  Filled 2010-10-12 (×4): qty 0.4

## 2010-10-12 MED ORDER — ACETAMINOPHEN 10 MG/ML IV SOLN
INTRAVENOUS | Status: AC
  Administered 2010-10-12: 1000 mg via INTRAVENOUS
  Filled 2010-10-12: qty 100

## 2010-10-12 MED ORDER — CEFAZOLIN SODIUM 1-5 GM-% IV SOLN
INTRAVENOUS | Status: DC | PRN
  Administered 2010-10-12: 2 g via INTRAVENOUS

## 2010-10-12 MED ORDER — ACETAMINOPHEN 10 MG/ML IV SOLN
1000.0000 mg | Freq: Four times a day (QID) | INTRAVENOUS | Status: AC
  Administered 2010-10-12 – 2010-10-14 (×6): 1000 mg via INTRAVENOUS
  Filled 2010-10-12 (×3): qty 100

## 2010-10-12 MED ORDER — SCOPOLAMINE 1 MG/3DAYS TD PT72
MEDICATED_PATCH | TRANSDERMAL | Status: AC
  Administered 2010-10-12: 1.5 mg via TRANSDERMAL
  Filled 2010-10-12: qty 1

## 2010-10-12 MED ORDER — LIDOCAINE HCL 1 % IJ SOLN
INTRAMUSCULAR | Status: DC | PRN
  Administered 2010-10-12: 30 mg via INTRADERMAL

## 2010-10-12 MED ORDER — POVIDONE-IODINE 10 % EX OINT
TOPICAL_OINTMENT | CUTANEOUS | Status: DC | PRN
  Administered 2010-10-12: 1 via TOPICAL

## 2010-10-12 MED ORDER — ONDANSETRON HCL 4 MG/2ML IJ SOLN
4.0000 mg | Freq: Four times a day (QID) | INTRAMUSCULAR | Status: DC | PRN
  Administered 2010-10-13 – 2010-10-14 (×4): 4 mg via INTRAVENOUS
  Filled 2010-10-12 (×4): qty 2

## 2010-10-12 MED ORDER — BUPIVACAINE HCL (PF) 0.5 % IJ SOLN
INTRAMUSCULAR | Status: AC
  Filled 2010-10-12: qty 30

## 2010-10-12 MED ORDER — MIDAZOLAM HCL 2 MG/2ML IJ SOLN
1.0000 mg | INTRAMUSCULAR | Status: DC | PRN
  Administered 2010-10-12: 2 mg via INTRAVENOUS

## 2010-10-12 MED ORDER — ENOXAPARIN SODIUM 40 MG/0.4ML ~~LOC~~ SOLN
SUBCUTANEOUS | Status: AC
  Administered 2010-10-12: 40 mg via SUBCUTANEOUS
  Filled 2010-10-12: qty 0.4

## 2010-10-12 MED ORDER — FENTANYL CITRATE 0.05 MG/ML IJ SOLN
25.0000 ug | INTRAMUSCULAR | Status: DC | PRN
  Administered 2010-10-12 (×4): 50 ug via INTRAVENOUS

## 2010-10-12 MED ORDER — LACTATED RINGERS IV SOLN
INTRAVENOUS | Status: DC
  Administered 2010-10-12 – 2010-10-14 (×3): via INTRAVENOUS

## 2010-10-12 MED ORDER — BUPROPION HCL ER (XL) 150 MG PO TB24
150.0000 mg | ORAL_TABLET | Freq: Every day | ORAL | Status: DC
  Administered 2010-10-12 – 2010-10-16 (×5): 150 mg via ORAL
  Filled 2010-10-12 (×6): qty 1

## 2010-10-12 MED ORDER — ONDANSETRON HCL 4 MG/2ML IJ SOLN
4.0000 mg | Freq: Once | INTRAMUSCULAR | Status: AC | PRN
  Administered 2010-10-12 (×2): 4 mg via INTRAVENOUS

## 2010-10-12 MED ORDER — SUCCINYLCHOLINE CHLORIDE 20 MG/ML IJ SOLN
INTRAMUSCULAR | Status: DC | PRN
  Administered 2010-10-12: 120 mg via INTRAVENOUS

## 2010-10-12 MED ORDER — LIDOCAINE HCL (PF) 1 % IJ SOLN
INTRAMUSCULAR | Status: AC
  Filled 2010-10-12: qty 5

## 2010-10-12 MED ORDER — ENOXAPARIN SODIUM 40 MG/0.4ML ~~LOC~~ SOLN
40.0000 mg | Freq: Once | SUBCUTANEOUS | Status: AC
  Administered 2010-10-12: 40 mg via SUBCUTANEOUS

## 2010-10-12 MED ORDER — LACTATED RINGERS IV SOLN
INTRAVENOUS | Status: DC
  Administered 2010-10-12 (×2): via INTRAVENOUS

## 2010-10-12 MED ORDER — POVIDONE-IODINE 10 % EX OINT
TOPICAL_OINTMENT | CUTANEOUS | Status: AC
  Filled 2010-10-12: qty 2

## 2010-10-12 MED ORDER — ONDANSETRON HCL 4 MG/2ML IJ SOLN
INTRAMUSCULAR | Status: AC
  Administered 2010-10-12: 4 mg via INTRAVENOUS
  Filled 2010-10-12: qty 2

## 2010-10-12 MED ORDER — PANTOPRAZOLE SODIUM 40 MG IV SOLR
40.0000 mg | INTRAVENOUS | Status: DC
  Administered 2010-10-12 – 2010-10-15 (×4): 40 mg via INTRAVENOUS
  Filled 2010-10-12 (×4): qty 40

## 2010-10-12 MED ORDER — ROCURONIUM BROMIDE 50 MG/5ML IV SOLN
INTRAVENOUS | Status: AC
  Filled 2010-10-12: qty 1

## 2010-10-12 MED ORDER — MIDAZOLAM HCL 2 MG/2ML IJ SOLN
INTRAMUSCULAR | Status: AC
  Administered 2010-10-12: 2 mg via INTRAVENOUS
  Filled 2010-10-12: qty 2

## 2010-10-12 MED ORDER — HYDROMORPHONE HCL 1 MG/ML IJ SOLN
2.0000 mg | INTRAMUSCULAR | Status: DC | PRN
  Administered 2010-10-12 – 2010-10-15 (×12): 2 mg via INTRAVENOUS
  Filled 2010-10-12 (×11): qty 2

## 2010-10-12 MED ORDER — HYDROMORPHONE HCL 1 MG/ML IJ SOLN
INTRAMUSCULAR | Status: AC
  Administered 2010-10-12: 2 mg via INTRAVENOUS
  Filled 2010-10-12: qty 2

## 2010-10-12 MED ORDER — PROPOFOL 10 MG/ML IV EMUL
INTRAVENOUS | Status: AC
  Filled 2010-10-12: qty 20

## 2010-10-12 MED ORDER — FENTANYL CITRATE 0.05 MG/ML IJ SOLN
INTRAMUSCULAR | Status: DC | PRN
  Administered 2010-10-12 (×5): 50 ug via INTRAVENOUS

## 2010-10-12 MED ORDER — DEXAMETHASONE SODIUM PHOSPHATE 4 MG/ML IJ SOLN
INTRAMUSCULAR | Status: AC
  Administered 2010-10-12: 4 mg via INTRAVENOUS
  Filled 2010-10-12: qty 1

## 2010-10-12 MED ORDER — ONDANSETRON HCL 4 MG/2ML IJ SOLN
4.0000 mg | Freq: Once | INTRAMUSCULAR | Status: AC
  Administered 2010-10-12: 4 mg via INTRAVENOUS

## 2010-10-12 SURGICAL SUPPLY — 44 items
APPLIER CLIP 13 LRG OPEN (CLIP) ×2
BAG HAMPER (MISCELLANEOUS) ×2 IMPLANT
BINDER ABD UNIV 9 30-45 (GAUZE/BANDAGES/DRESSINGS) ×1 IMPLANT
BINDER ABDOMINAL 9 (GAUZE/BANDAGES/DRESSINGS) ×2
CLIP APPLIE 13 LRG OPEN (CLIP) ×1 IMPLANT
CLOTH BEACON ORANGE TIMEOUT ST (SAFETY) ×2 IMPLANT
COVER LIGHT HANDLE STERIS (MISCELLANEOUS) ×4 IMPLANT
DECANTER SPIKE VIAL GLASS SM (MISCELLANEOUS) IMPLANT
DURAPREP 26ML APPLICATOR (WOUND CARE) ×2 IMPLANT
ELECT REM PT RETURN 9FT ADLT (ELECTROSURGICAL) ×2
ELECTRODE REM PT RTRN 9FT ADLT (ELECTROSURGICAL) ×1 IMPLANT
FORMALIN 10 PREFIL 480ML (MISCELLANEOUS) ×2 IMPLANT
GLOVE BIO SURGEON STRL SZ7.5 (GLOVE) ×2 IMPLANT
GLOVE BIOGEL PI IND STRL 7.0 (GLOVE) ×1 IMPLANT
GLOVE BIOGEL PI IND STRL 7.5 (GLOVE) ×1 IMPLANT
GLOVE BIOGEL PI INDICATOR 7.0 (GLOVE) ×1
GLOVE BIOGEL PI INDICATOR 7.5 (GLOVE) ×1
GLOVE ECLIPSE 6.5 STRL STRAW (GLOVE) ×2 IMPLANT
GLOVE ECLIPSE 7.0 STRL STRAW (GLOVE) ×2 IMPLANT
GOWN BRE IMP SLV AUR XL STRL (GOWN DISPOSABLE) ×6 IMPLANT
KIT ROOM TURNOVER APOR (KITS) ×2 IMPLANT
MANIFOLD NEPTUNE II (INSTRUMENTS) ×2 IMPLANT
MESH PHYSIO OVAL 15X20CM (Mesh General) ×2 IMPLANT
NEEDLE HYPO 25X1 1.5 SAFETY (NEEDLE) ×2 IMPLANT
NS IRRIG 1000ML POUR BTL (IV SOLUTION) ×2 IMPLANT
PACK ABDOMINAL MAJOR (CUSTOM PROCEDURE TRAY) ×2 IMPLANT
PAD ARMBOARD 7.5X6 YLW CONV (MISCELLANEOUS) ×2 IMPLANT
SET BASIN LINEN APH (SET/KITS/TRAYS/PACK) ×2 IMPLANT
SPONGE GAUZE 4X4 12PLY (GAUZE/BANDAGES/DRESSINGS) ×2 IMPLANT
STAPLER VISISTAT (STAPLE) ×2 IMPLANT
SUT ETHIBOND NAB MO 7 #0 18IN (SUTURE) IMPLANT
SUT NOVA NAB GS-21 1 T12 (SUTURE) IMPLANT
SUT NOVA NAB GS-22 2 2-0 T-19 (SUTURE) IMPLANT
SUT NOVA NAB GS-26 0 60 (SUTURE) IMPLANT
SUT PROLENE 0 CT 1 CR/8 (SUTURE) ×2 IMPLANT
SUT PROLENE 1 CT 1 30 (SUTURE) ×8 IMPLANT
SUT SILK 2 0 (SUTURE)
SUT SILK 2-0 18XBRD TIE 12 (SUTURE) IMPLANT
SUT VIC AB 2-0 CT1 27 (SUTURE) ×2
SUT VIC AB 2-0 CT1 TAPERPNT 27 (SUTURE) ×2 IMPLANT
SUT VIC AB 3-0 SH 27 (SUTURE)
SUT VIC AB 3-0 SH 27X BRD (SUTURE) IMPLANT
SUT VIC AB 4-0 PS2 27 (SUTURE) IMPLANT
SYR CONTROL 10ML LL (SYRINGE) ×2 IMPLANT

## 2010-10-12 NOTE — Anesthesia Preprocedure Evaluation (Addendum)
Anesthesia Evaluation  Name, MR# and DOB Patient awake  General Assessment Comment  Reviewed: Allergy & Precautions, H&P  and Patient's Chart, lab work & pertinent test results  History of Anesthesia Complications (+) PONV  Airway Mallampati: II  Neck ROM: Full    Dental  (+) Partial Lower   Pulmonary    pulmonary exam normal   Cardiovascular hypertension, Regular Normal   Neuro/Psych  GI/Hepatic/Renal   Endo/Other   Abdominal   Musculoskeletal  Hematology  Anemia B12 def    Peds  Reproductive/Obstetrics   Anesthesia Other Findings             Anesthesia Physical Anesthesia Plan  ASA: II  Anesthesia Plan: General   Post-op Pain Management:    Induction: Intravenous  Airway Management Planned: Oral ETT  Additional Equipment:   Intra-op Plan:   Post-operative Plan: Extubation in OR  Informed Consent: I have reviewed the patients History and Physical, chart, labs and discussed the procedure including the risks, benefits and alternatives for the proposed anesthesia with the patient or authorized representative who has indicated his/her understanding and acceptance.     Plan Discussed with:   Anesthesia Plan Comments:         Anesthesia Quick Evaluation

## 2010-10-12 NOTE — Op Note (Signed)
Preoperative diagnosis: Recurrent incisional hernia  Postoperative diagnosis: Same as above  Procedure: Recurrent incisional herniorrhaphy with mesh  Surgeon: Dr. Franky Macho, MD  Anesthesia: Gen. endotracheal  Indications: Patient is a 40 year old with white female status post several of incisional hernia repairs who now presents with recurrence of her incisional hernia. She was last repaired approximately 3 months ago. She now presents for a recurrent incisional herniorrhaphy with mesh. The risks and benefits of the procedure including bleeding, infection, or possibly recurrence of the hernia were fully explained to the patient, gave informed consent.  Procedure note: The patient was placed in the supine position. After induction of general endotracheal anesthesia, the abdomen was prepped and draped using the usual sterile technique with DuraPrep. Surgical site for drainage was performed.  An upper midline incision was made through the previous midline incision. This was taken down to the fascia. The peritoneal cavity was entered into without difficulty. It appeared that the previously placed mesh had freed away from the right side of the fascia. The sutures were intact, though the disruption appeared to be either through the muscle or from the mesh itself. The old mesh was excised and disposed of. A new 15 cm x 20 cm Proceed mesh was then attached to the fascia using #1 Prolene interrupted sutures in a circumferential manner. Next, the fascia was reapproximated primarily using #1 Prolene interrupted sutures. The subcutaneous layer was reapproximated using 2-0 Vicryl interrupted sutures. The skin was closed using staples. 0.5% Sensorcaine was instilled and the surrounding wound. Betadine ointment and dressed a dressing were applied.  All tibial counts were correct at the end of the procedure. The patient was extubated in the operating room and went back to recovery room awake in stable  condition.  Complications: None specimens: None  Blood loss: 50 cc

## 2010-10-12 NOTE — Anesthesia Postprocedure Evaluation (Signed)
  Anesthesia Post-op Note  Patient: Amanda Hammond  Procedure(s) Performed:  HERNIA REPAIR INCISIONAL - Recurrent Incisional Hernia Repair with Mesh  Patient Location: PACU  Anesthesia Type: General  Level of Consciousness: awake, alert  and oriented  Airway and Oxygen Therapy: Patient Spontanous Breathing and Patient connected to face mask oxygen  Post-op Pain: mild  Post-op Assessment: Post-op Vital signs reviewed, Patient's Cardiovascular Status Stable, Respiratory Function Stable, Patent Airway and Pain level controlled  Post-op Vital Signs: stable  Complications: No apparent anesthesia complications

## 2010-10-12 NOTE — Plan of Care (Signed)
Problem: Phase I Progression Outcomes Goal: Initial discharge plan identified Outcome: Completed/Met Date Met:  10/12/10 Discharged to home in 1 to 2 days

## 2010-10-12 NOTE — Interval H&P Note (Signed)
PE reviewed.  No new medical problems.  OK to proceed.

## 2010-10-12 NOTE — Transfer of Care (Signed)
Immediate Anesthesia Transfer of Care Note  Patient: Amanda Hammond  Procedure(s) Performed:  HERNIA REPAIR INCISIONAL - Recurrent Incisional Hernia Repair with Mesh  Patient Location: PACU  Anesthesia Type: General  Level of Consciousness: awake, alert  and oriented  Airway & Oxygen Therapy: Patient Spontanous Breathing and Patient connected to face mask oxygen  Post-op Assessment: Report given to PACU RN, Post -op Vital signs reviewed and stable and Patient moving all extremities  Post vital signs: stable  Complications: No apparent anesthesia complications

## 2010-10-12 NOTE — Anesthesia Procedure Notes (Addendum)
Procedure Name: Intubation Date/Time: 10/12/2010 4:19 PM Performed by: Glynn Octave Pre-anesthesia Checklist: Patient identified, Patient being monitored, Emergency Drugs available, Timeout performed and Suction available Patient Re-evaluated:Patient Re-evaluated prior to inductionOxygen Delivery Method: Circle System Utilized Preoxygenation: Pre-oxygenation with 100% oxygen Intubation Type: IV induction Ventilation: Mask ventilation without difficulty and Oral airway inserted - appropriate to patient size Laryngoscope Size: Mac and 3 Grade View: Grade II Tube type: Oral Tube size: 7.0 mm Number of attempts: 1 Airway Equipment and Method: stylet Placement Confirmation: ETT inserted through vocal cords under direct vision,  positive ETCO2 and breath sounds checked- equal and bilateral Secured at: 21 cm Tube secured with: Tape Dental Injury: Teeth and Oropharynx as per pre-operative assessment

## 2010-10-13 LAB — CBC
HCT: 34.4 % — ABNORMAL LOW (ref 36.0–46.0)
MCH: 25.1 pg — ABNORMAL LOW (ref 26.0–34.0)
MCHC: 32.2 g/dL (ref 30.0–36.0)
MCV: 77.8 fL — ABNORMAL LOW (ref 78.0–100.0)
MCV: 77.8 fL — ABNORMAL LOW (ref 78.0–100.0)
Platelets: 310 10*3/uL (ref 150–400)
RBC: 4.23 MIL/uL (ref 3.87–5.11)
RDW: 15.3 % (ref 11.5–15.5)
WBC: 13.4 10*3/uL — ABNORMAL HIGH (ref 4.0–10.5)

## 2010-10-13 LAB — BASIC METABOLIC PANEL
CO2: 31 mEq/L (ref 19–32)
Calcium: 8.8 mg/dL (ref 8.4–10.5)
Glucose, Bld: 120 mg/dL — ABNORMAL HIGH (ref 70–99)
Sodium: 134 mEq/L — ABNORMAL LOW (ref 135–145)

## 2010-10-13 MED ORDER — ACETAMINOPHEN 10 MG/ML IV SOLN
INTRAVENOUS | Status: AC
  Filled 2010-10-13: qty 100

## 2010-10-13 NOTE — Progress Notes (Signed)
Dr. Lovell Sheehan paged at [phone] regarding pt's recurrent nausea and itching via Vtext throught email. At this time, MD has yet to return page. Phone with number [phone] handed off to [place], RN on night shift. Dagoberto Ligas, RN

## 2010-10-13 NOTE — Addendum Note (Signed)
Addendum  created 10/13/10 1209 by Quincie Haroon   Modules edited:Notes Section    

## 2010-10-13 NOTE — Plan of Care (Signed)
Problem: Phase III Progression Outcomes Goal: Voiding independently Outcome: Completed/Met Date Met:  10/13/10 incontent

## 2010-10-13 NOTE — Anesthesia Postprocedure Evaluation (Signed)
  Anesthesia Post-op Note  Patient: Amanda Hammond  Procedure(s) Performed:  HERNIA REPAIR INCISIONAL - Recurrent Incisional Hernia Repair with Mesh  Patient Location: PACU  Patient now on floor.   Anesthesia Type: General  Level of Consciousness: awake, alert  and oriented  Airway and Oxygen Therapy: Patient Spontanous Breathing  Post-op Pain: mild  Post-op Assessment: Post-op Vital signs reviewed, Patient's Cardiovascular Status Stable, Respiratory Function Stable, Adequate PO intake and Pain level controlled  Post-op Vital Signs: stable  Complications: No apparent anesthesia complications

## 2010-10-13 NOTE — Plan of Care (Signed)
Problem: Phase II Progression Outcomes Goal: Progress activity as tolerated unless otherwise ordered Outcome: Progressing Patient becomes nauseated with activity

## 2010-10-13 NOTE — Addendum Note (Signed)
Addendum  created 10/13/10 1209 by Glynn Octave   Modules edited:Notes Section

## 2010-10-13 NOTE — Progress Notes (Signed)
1 Day Post-Op  Subjective: *Moderate incisional pain, but somewhat controlled with pain meds.**  Objective: Vital signs in last 24 hours: Temp:  [97.7 F (36.5 C)-99.2 F (37.3 C)] 97.9 F (36.6 C) (07/12 2130) Pulse Rate:  [79-103] 82  (07/12 0821) Resp:  [12-24] 18  (07/12 0632) BP: (123-170)/(74-106) 123/79 mmHg (07/12 0632) SpO2:  [92 %-100 %] 97 % (07/12 0821) Weight:  [96.6 kg (212 lb 15.4 oz)-96.616 kg (213 lb)] 212 lb 15.4 oz (96.6 kg) (07/11 1920) Last BM Date: 10/12/10  Intake/Output from previous day: 07/11 0701 - 07/12 0700 In: 2192.5 [P.O.:240; I.V.:1682.5; IV Piggyback:210] Out: 275 [Urine:200; Blood:75] Intake/Output this shift:    General appearance: mild distress Resp: clear to auscultation bilaterally Cardio: regular rate and rhythm Abdomen:  Soft, dressing dry, intact. Lab Results:  @LABLAST2 (wbc:2,hgb:2,hct:2,plt:2) BMET  Basename 10/13/10 0503 10/10/10 1500  NA 134* 141  K 4.2 3.6  CL 96 106  CO2 31 28  GLUCOSE 120* 106*  BUN 10 8  CREATININE 0.53 0.60  CALCIUM 8.8 8.9     Anti-infectives: Anti-infectives    None      Assessment/Plan: s/p Procedure(s): HERNIA REPAIR INCISIONAL Advance diet Plan for discharge tomorrow.  Start ambulation today.  Will admit to hospital today.  LOS: 1 day    Amanda Hammond A 10/13/2010

## 2010-10-14 LAB — CBC
HCT: 32.3 % — ABNORMAL LOW (ref 36.0–46.0)
MCHC: 31 g/dL (ref 30.0–36.0)
Platelets: 279 10*3/uL (ref 150–400)
RDW: 15.1 % (ref 11.5–15.5)
WBC: 9.2 10*3/uL (ref 4.0–10.5)

## 2010-10-14 LAB — BASIC METABOLIC PANEL
BUN: 7 mg/dL (ref 6–23)
Creatinine, Ser: 0.49 mg/dL — ABNORMAL LOW (ref 0.50–1.10)
GFR calc Af Amer: >60 mL/min (ref >60)
GFR calc non Af Amer: >60 mL/min (ref >60)
Potassium: 3.3 mEq/L — ABNORMAL LOW (ref 3.5–5.1)

## 2010-10-14 MED ORDER — SODIUM CHLORIDE 0.9 % IJ SOLN
INTRAMUSCULAR | Status: AC
  Administered 2010-10-14: 10 mL
  Filled 2010-10-14: qty 10

## 2010-10-14 MED ORDER — POTASSIUM CHLORIDE 10 MEQ/100ML IV SOLN
INTRAVENOUS | Status: AC
  Administered 2010-10-14: 10 meq via INTRAVENOUS
  Filled 2010-10-14: qty 100

## 2010-10-14 MED ORDER — KETOROLAC TROMETHAMINE 30 MG/ML IJ SOLN
30.0000 mg | Freq: Four times a day (QID) | INTRAMUSCULAR | Status: DC | PRN
  Administered 2010-10-14 – 2010-10-16 (×7): 30 mg via INTRAVENOUS
  Filled 2010-10-14 (×7): qty 1

## 2010-10-14 MED ORDER — SODIUM CHLORIDE 0.9 % IV SOLN
INTRAVENOUS | Status: DC
  Filled 2010-10-14: qty 1000

## 2010-10-14 MED ORDER — POTASSIUM CHLORIDE 10 MEQ/100ML IV SOLN
10.0000 meq | INTRAVENOUS | Status: AC
  Administered 2010-10-14 (×3): 10 meq via INTRAVENOUS
  Filled 2010-10-14 (×2): qty 100

## 2010-10-14 MED ORDER — ONDANSETRON HCL 4 MG/2ML IJ SOLN
4.0000 mg | INTRAMUSCULAR | Status: DC | PRN
  Administered 2010-10-14 – 2010-10-15 (×5): 4 mg via INTRAVENOUS
  Filled 2010-10-14 (×5): qty 2

## 2010-10-14 MED ORDER — MUPIROCIN 2 % EX OINT
1.0000 "application " | TOPICAL_OINTMENT | Freq: Two times a day (BID) | CUTANEOUS | Status: DC
  Administered 2010-10-14 – 2010-10-16 (×4): 1 via NASAL
  Filled 2010-10-14: qty 22

## 2010-10-14 MED ORDER — CHLORHEXIDINE GLUCONATE CLOTH 2 % EX PADS
6.0000 | MEDICATED_PAD | Freq: Every day | CUTANEOUS | Status: DC
  Administered 2010-10-14 – 2010-10-16 (×3): 6 via TOPICAL
  Filled 2010-10-14: qty 6

## 2010-10-14 MED ORDER — POTASSIUM CHLORIDE IN NACL 20-0.9 MEQ/L-% IV SOLN
INTRAVENOUS | Status: DC
  Administered 2010-10-14: 09:00:00 via INTRAVENOUS

## 2010-10-14 MED ORDER — POTASSIUM CHLORIDE CRYS ER 20 MEQ PO TBCR
20.0000 meq | EXTENDED_RELEASE_TABLET | Freq: Two times a day (BID) | ORAL | Status: DC
  Administered 2010-10-14 – 2010-10-16 (×4): 20 meq via ORAL
  Filled 2010-10-14 (×4): qty 1

## 2010-10-14 NOTE — Progress Notes (Signed)
2 Days Post-Op  Subjective: *dry heaves.  Still having dry heaves and nausea with movement.  PO intake poor.  Seems to be related to incisional pain, though dilaudid does relieve the pain somewhat.**  Objective: Vital signs in last 24 hours: Temp:  [97.8 F (36.6 C)-98.6 F (37 C)] 98.4 F (36.9 C) (07/13 0600) Pulse Rate:  [76-98] 98  (07/13 0600) Resp:  [16-20] 16  (07/13 0600) BP: (117-161)/(78-97) 161/97 mmHg (07/13 0600) SpO2:  [90 %-97 %] 90 % (07/13 0600) Last BM Date: 10/12/10  Intake/Output from previous day: 07/12 0701 - 07/13 0700 In: 2155.3 [P.O.:100; I.V.:1741.3; IV Piggyback:314] Out: 425 [Urine:425] Intake/Output this shift:    Resp: clear to auscultation bilaterally Cardio: regular rate and rhythm, S1, S2 normal, no murmur, click, rub or gallop Abdomen:  Soft, incision healing well.  No distension noted.  Lab Results:  @LABLAST2 (wbc:2,hgb:2,hct:2,plt:2) BMET  Basename 10/14/10 0453 10/13/10 0503  NA 131* 134*  K 3.3* 4.2  CL 92* 96  CO2 31 31  GLUCOSE 98 120*  BUN 7 10  CREATININE 0.49* 0.53  CALCIUM 8.5 8.8    Studies/Results: No results found.  Anti-infectives: Anti-infectives    None      Assessment/Plan: s/p Procedure(s): HERNIA REPAIR INCISIONAL Still with significant nausea and abdominal pain.  Poor po intake. Stop iv tylenol.  Toradol for pain.  Will try po vicodin.  Increase Zofran.   LOS: 2 days    Athira Janowicz A 10/14/2010

## 2010-10-15 MED ORDER — SODIUM CHLORIDE 0.9 % IJ SOLN
INTRAMUSCULAR | Status: AC
  Administered 2010-10-15: 10 mL
  Filled 2010-10-15: qty 10

## 2010-10-15 NOTE — Progress Notes (Signed)
3 Days Post-Op  Subjective: Patient's nausea and dry heaves has somewhat improved. Her pain control is better. She is starting to ambulate better.  Objective: Vital signs in last 24 hours: Temp:  [97.6 F (36.4 C)-99.1 F (37.3 C)] 99.1 F (37.3 C) (07/14 0600) Pulse Rate:  [88-98] 98  (07/14 0600) Resp:  [16-20] 16  (07/14 0600) BP: (136-162)/(90-98) 144/96 mmHg (07/14 0600) SpO2:  [95 %-98 %] 98 % (07/14 0600) Last BM Date: 10/12/10  Intake/Output from previous day: 07/13 0701 - 07/14 0700 In: 1204 [P.O.:720; I.V.:170; IV Piggyback:314] Out: 2425 [Urine:2425] Intake/Output this shift:    Lungs clear to auscultation. Heart examination reveals a regular rate and rhythm. Her abdomen is soft. Her incision is healing well.  Lab Results:  @LABLAST2 (wbc:2,hgb:2,hct:2,plt:2)@  BMET  [facility] 10/14/10 0453 10/13/10 0503  NA 131* 134*  K 3.3* 4.2  CL 92* 96  CO2 31 31  GLUCOSE 98 120*  BUN 7 10  CREATININE 0.49* 0.53  CALCIUM 8.5 8.8     Assessment/Plan: s/p Procedure(s): HERNIA REPAIR INCISIONAL Her nausea and vomiting have improved. Pain control is improving. We will start advancing her diet as tolerated. I suspect she'll be able just be discharged in the next 24-48 hours.  LOS: 3 days    [name] A 10/15/2010

## 2010-10-15 NOTE — Plan of Care (Signed)
Problem: Phase II Progression Outcomes Goal: Progress activity as tolerated unless otherwise ordered Outcome: Progressing Pt still needs some minimal stand by assist prn

## 2010-10-16 MED ORDER — OXYCODONE-ACETAMINOPHEN 7.5-325 MG PO TABS: ORAL_TABLET | ORAL | Status: DC

## 2010-10-16 NOTE — Progress Notes (Signed)
Pt discharge home today per Dr. Lovell Sheehan. Pt's IV site D/C'd and WNL. Pt's VS stable at this time. Discharge paperwork and prescriptions provided to patient. Patient verbalized understanding. Pt left floor via WC in stable condition accompanied by RN in stable condition. Dagoberto Ligas, RN

## 2010-10-16 NOTE — Progress Notes (Signed)
  4 Days Post-Op  Subjective: **Feeling much better today. Still has moderate incisional pain, but the nausea and dry heaves have resolved.*  Objective: Vital signs in last 24 hours: Temp:  [98.2 F (36.8 C)-98.4 F (36.9 C)] 98.2 F (36.8 C) (07/15 0600) Pulse Rate:  [82-100] 92  (07/15 0600) Resp:  [16-19] 16  (07/15 0600) BP: (87-143)/(52-93) 87/52 mmHg (07/15 0600) SpO2:  [95 %-98 %] 98 % (07/15 0600) Last BM Date: 10/15/10  Intake/Output from previous day: 07/14 0701 - 07/15 0700 In: 242 [P.O.:240; IV Piggyback:2] Out: -  Intake/Output this shift:    General appearance: alert and cooperative Resp: clear to auscultation bilaterally Cardio: regular rate and rhythm, S1, S2 normal, no murmur, click, rub or gallop Abdomen: Soft. Incision healing well. No evidence of hernia. No purulent drainage noted.  Lab Results:   Basename 10/14/10 0453 10/13/10 0947  WBC 9.2 12.5*  HGB 10.0* 10.6*  HCT 32.3* 32.9*  PLT 279 310   BMET  Basename 10/14/10 0453  NA 131*  K 3.3*  CL 92*  CO2 31  GLUCOSE 98  BUN 7  CREATININE 0.49*  CALCIUM 8.5    Anti-infectives: Anti-infectives    None      Assessment/Plan: s/p Procedure(s): HERNIA REPAIR INCISIONAL Discharge  LOS: 4 days    Leondra Cullin A 10/16/2010

## 2010-10-16 NOTE — Discharge Summary (Signed)
Physician Discharge Summary  Patient ID: Amanda Hammond MRN: [id number] DOB/AGE: [birth year] 40 y.o.  Admit date: 10/12/2010 Discharge date: 10/16/2010  Admission Diagnoses: Recurrent incisional hernia  Discharge Diagnoses: Same Active Problems:  * No active hospital problems. *    Discharged Condition: Stable  Hospital Course: Patient is a 40 year old white female status post an incisional herniorrhaphy with mesh x2 in the past. She presented with recurrent incisional hernia. She underwent a recurrent incisional herniorrhaphy with mesh on 10/12/2010. Her postoperative course was remarkable for severe incisional pain and dry heaves. These did subsequently resolve. Her pain on discharge is moderate, but controlled with oral pain medications. She is being discharged home in good and improving condition.*   Discharge Exam: Blood pressure 87/52, pulse 92, temperature 98.2 F (36.8 C), temperature source Oral, resp. rate 16, height 5\' 6"  (1.676 m), weight 96.6 kg (212 lb 15.4 oz), last menstrual period 10/03/2010, SpO2 98.00%. General appearance: alert and cooperative Abdomen: Soft, incision healing well.  Disposition: home   Current Discharge Medication List    START taking these medications   Details  oxyCODONE-acetaminophen (PERCOCET) 7.5-325 MG per tablet 1-2 tabs orally every 4hours prn pain Qty: 50 tablet, Refills: 0      CONTINUE these medications which have NOT CHANGED   Details  buPROPion (WELLBUTRIN XL) 150 MG 24 hr tablet Take 150 mg by mouth daily.      ibuprofen (ADVIL,MOTRIN) 200 MG tablet Take 400 mg by mouth every 6 (six) hours as needed. For pain     Multiple Vitamins-Minerals (MULTIVITAMIN WITH MINERALS) tablet Take 1 tablet by mouth daily.      triamterene-hydrochlorothiazide (MAXZIDE-25) 37.5-25 MG per tablet Take 1 tablet by mouth daily.         Follow-up Information    Follow up with Evann Erazo A. Make an appointment on 10/20/2010.   Contact  information:   [address] [place] [address] [phone]          Signed: Dalia Heading 10/16/2010, 8:23 AM

## 2010-10-25 ENCOUNTER — Encounter (HOSPITAL_COMMUNITY): Payer: Self-pay | Admitting: General Surgery

## 2011-02-16 ENCOUNTER — Other Ambulatory Visit (HOSPITAL_COMMUNITY): Payer: Self-pay | Admitting: General Surgery

## 2011-02-16 DIAGNOSIS — K432 Incisional hernia without obstruction or gangrene: Secondary | ICD-10-CM

## 2011-02-20 ENCOUNTER — Ambulatory Visit (HOSPITAL_COMMUNITY)
Admission: RE | Admit: 2011-02-20 | Discharge: 2011-02-20 | Disposition: A | Discharge status: Home / Self Care | Payer: PRIVATE HEALTH INSURANCE | Source: Ambulatory Visit | Attending: General Surgery | Admitting: General Surgery

## 2011-02-20 DIAGNOSIS — K432 Incisional hernia without obstruction or gangrene: Secondary | ICD-10-CM

## 2011-02-20 DIAGNOSIS — K439 Ventral hernia without obstruction or gangrene: Secondary | ICD-10-CM | POA: Insufficient documentation

## 2011-02-20 MED ORDER — IOHEXOL 300 MG/ML  SOLN
100.0000 mL | Freq: Once | INTRAMUSCULAR | Status: AC | PRN
  Administered 2011-02-20: 100 mL via INTRAVENOUS

## 2013-06-11 ENCOUNTER — Ambulatory Visit (INDEPENDENT_AMBULATORY_CARE_PROVIDER_SITE_OTHER): Payer: Self-pay

## 2013-06-11 ENCOUNTER — Ambulatory Visit (INDEPENDENT_AMBULATORY_CARE_PROVIDER_SITE_OTHER): Payer: PRIVATE HEALTH INSURANCE | Admitting: Neurology

## 2013-06-11 DIAGNOSIS — Z0289 Encounter for other administrative examinations: Secondary | ICD-10-CM

## 2013-06-11 DIAGNOSIS — G5600 Carpal tunnel syndrome, unspecified upper limb: Secondary | ICD-10-CM

## 2013-06-11 NOTE — Procedures (Signed)
HISTORY:  Amanda Hammond is a 43 year old patient with a history of obesity and some numbness in the hands that has been present for one year, with symptoms much worse over the last 2 months. The patient reports that the left hand is more involved than the right. The patient is being evaluated for a possible neuropathy or a cervical radiculopathy.  NERVE CONDUCTION STUDIES:  Nerve conduction studies were performed on both upper extremities. The distal motor latencies for the median nerves were prolonged bilaterally, with normal motor amplitudes for these nerves bilaterally. The distal motor latencies and motor amplitudes for the ulnar nerves were normal bilaterally. The F wave latencies for the median nerves were slightly prolonged on the left, normal on the right, and normal for the ulnar nerves bilaterally. The nerve conduction velocities for the median and ulnar nerves were normal bilaterally. The sensory latencies for the median nerves were prolonged bilaterally, normal for the ulnar nerves bilaterally.  EMG STUDIES:  EMG study was performed on the left upper extremity:  The first dorsal interosseous muscle reveals 2 to 4 K units with full recruitment. No fibrillations or positive waves were noted. The abductor pollicis brevis muscle reveals 2 to 4 K units with full recruitment. No fibrillations or positive waves were noted. The extensor indicis proprius muscle reveals 1 to 3 K units with full recruitment. No fibrillations or positive waves were noted. The pronator teres muscle reveals 2 to 3 K units with full recruitment. No fibrillations or positive waves were noted. The biceps muscle reveals 1 to 2 K units with full recruitment. No fibrillations or positive waves were noted. The triceps muscle reveals 2 to 4 K units with full recruitment. No fibrillations or positive waves were noted. The anterior deltoid muscle reveals 2 to 3 K units with full recruitment. No fibrillations or  positive waves were noted. The cervical paraspinal muscles were tested at 2 levels. No abnormalities of insertional activity were seen at either level tested. There was good relaxation.  EMG study was performed on the right upper extremity:  The first dorsal interosseous muscle reveals 2 to 4 K units with full recruitment. No fibrillations or positive waves were noted. The abductor pollicis brevis muscle reveals 2 to 5 K units with decreased recruitment. No fibrillations or positive waves were noted. The extensor indicis proprius muscle reveals 1 to 3 K units with full recruitment. No fibrillations or positive waves were noted. The pronator teres muscle reveals 2 to 3 K units with full recruitment. No fibrillations or positive waves were noted. The biceps muscle reveals 1 to 2 K units with full recruitment. No fibrillations or positive waves were noted. The triceps muscle reveals 2 to 4 K units with full recruitment. No fibrillations or positive waves were noted. The anterior deltoid muscle reveals 2 to 3 K units with full recruitment. No fibrillations or positive waves were noted. The cervical paraspinal muscles were tested at 2 levels. No abnormalities of insertional activity were seen at either level tested. There was good relaxation.   IMPRESSION:  Nerve conduction studies done on both upper extremities revealed evidence of bilateral mild carpal tunnel syndrome. EMG evaluation of the upper extremities is relatively unremarkable, some findings on the right upper extremity are consistent with carpal tunnel syndrome. There is no evidence of a cervical radiculopathy on either side.  Marlan Palau MD 06/11/2013 1:40 PM  [facility] [address] [place], [place] [address]  Phone [phone] Fax [phone]

## 2015-09-13 ENCOUNTER — Telehealth: Payer: Self-pay | Admitting: Neurology

## 2015-09-13 NOTE — Telephone Encounter (Addendum)
Faxed records to [facility] on 09/10/15 and received confirmation 09/10/15 at 1530.

## 2015-09-30 DIAGNOSIS — Z0289 Encounter for other administrative examinations: Secondary | ICD-10-CM

## 2016-02-17 DIAGNOSIS — M797 Fibromyalgia: Secondary | ICD-10-CM | POA: Insufficient documentation

## 2016-02-17 DIAGNOSIS — G4709 Other insomnia: Secondary | ICD-10-CM | POA: Insufficient documentation

## 2016-02-17 DIAGNOSIS — R5383 Other fatigue: Secondary | ICD-10-CM | POA: Insufficient documentation

## 2016-02-17 NOTE — Progress Notes (Deleted)
Office Visit Note  Patient: Amanda Hammond             Date of Birth: 05-01-1970           MRN: [id number]             PCP: Deloria Lair, MD Referring: Deloria Lair., MD Visit Date: 02/22/2016 Occupation: '@GUAROCC' @    Subjective:  No chief complaint on file.   History of Present Illness: Amanda Hammond is a 45 y.o. female ***   Activities of Daily Living:  Patient reports morning stiffness for *** {minute/hour:19697}.   Patient {ACTIONS;DENIES/REPORTS:21021675::"Denies"} nocturnal pain.  Difficulty dressing/grooming: {ACTIONS;DENIES/REPORTS:21021675::"Denies"} Difficulty climbing stairs: {ACTIONS;DENIES/REPORTS:21021675::"Denies"} Difficulty getting out of chair: {ACTIONS;DENIES/REPORTS:21021675::"Denies"} Difficulty using hands for taps, buttons, cutlery, and/or writing: {ACTIONS;DENIES/REPORTS:21021675::"Denies"}   No Rheumatology ROS completed.   PMFS History:  Patient Active Problem List   Diagnosis Date Noted  . B12 DEFICIENCY 02/01/2010  . VITAMIN D DEFICIENCY 02/01/2010  . ANEMIA, IRON DEFICIENCY 12/30/2009  . OTHER DYSPNEA AND RESPIRATORY ABNORMALITIES 12/30/2009  . Nonspecific (abnormal) findings on radiological and other examination of body structure 12/30/2009  . COMPUTERIZED TOMOGRAPHY, CHEST, ABNORMAL 12/30/2009    Past Medical History:  Diagnosis Date  . Anemia   . Hypertension   . PONV (postoperative nausea and vomiting)     Family History  Problem Relation Age of Onset  . Anesthesia problems Neg Hx   . Hypotension Neg Hx   . Pseudochol deficiency Neg Hx   . Malignant hyperthermia Neg Hx    Past Surgical History:  Procedure Laterality Date  . APPENDECTOMY  2010   [facility]  . CHOLECYSTECTOMY  1999   [facility], [place]  . GASTRIC BYPASS  2003   [facility]  . HERNIA REPAIR  08/2010, 02/2010    incisional, [facility], [place]  . INCISIONAL HERNIA REPAIR  10/12/2010   Procedure: HERNIA REPAIR INCISIONAL;  Surgeon: Jamesetta So;  Location: [facility];  Service: General;   Laterality: N/A;  Recurrent Incisional Hernia Repair with Mesh  . PANNICULECTOMY  2008   [place] History   Social History Narrative  . No narrative on file     Objective: Vital Signs: There were no vitals taken for this visit.   Physical Exam   Musculoskeletal Exam: ***  CDAI Exam: No CDAI exam completed.    Investigation: Findings:    Labs from June 22, 2015 shows CBC with diff normal, CMP is normal, sed rate normal at 12.  CK is normal.  TSH is normal.  Urinalysis is normal.  Uric acid is normal at 7.1.  Angiotensin 1 converting enzyme is normal.  ANA is negative.  CCP is negative.  14-33 ETTA is normal.  HLA-B27 is negative.  Hepatitis panel is negative.  G6-PD is negative.  Immunoglobulins are normal.  SPEP, M-spike is negative.  TB-Gold is negative.  Ultrasound is negative except for carpal tunnel median nerve is enlarged.      Imaging: No results found.  Speciality Comments: No specialty comments available.    Procedures:  No procedures performed Allergies: Patient has no known allergies.   Assessment / Plan: Visit Diagnoses: No diagnosis found.    Orders: No orders of the defined types were placed in this encounter.  No orders of the defined types were placed in this encounter.   Face-to-face time spent with patient was *** minutes. 50% of time was spent in counseling and coordination of care.  Follow-Up Instructions: No Follow-up on file.   Earle Troiano  [place] Thresher, RT

## 2016-02-22 ENCOUNTER — Encounter: Payer: Self-pay | Admitting: Rheumatology

## 2016-02-22 ENCOUNTER — Ambulatory Visit (INDEPENDENT_AMBULATORY_CARE_PROVIDER_SITE_OTHER): Payer: BLUE CROSS/BLUE SHIELD | Admitting: Rheumatology

## 2016-02-22 VITALS — BP 138/89 | HR 96 | Resp 14 | Ht 66.0 in | Wt 237.0 lb

## 2016-02-22 DIAGNOSIS — M79642 Pain in left hand: Secondary | ICD-10-CM | POA: Diagnosis not present

## 2016-02-22 DIAGNOSIS — M79641 Pain in right hand: Secondary | ICD-10-CM

## 2016-02-22 DIAGNOSIS — G4709 Other insomnia: Secondary | ICD-10-CM | POA: Diagnosis not present

## 2016-02-22 DIAGNOSIS — R5382 Chronic fatigue, unspecified: Secondary | ICD-10-CM

## 2016-02-22 DIAGNOSIS — M797 Fibromyalgia: Secondary | ICD-10-CM | POA: Diagnosis not present

## 2016-02-22 DIAGNOSIS — M19041 Primary osteoarthritis, right hand: Secondary | ICD-10-CM

## 2016-02-22 DIAGNOSIS — M170 Bilateral primary osteoarthritis of knee: Secondary | ICD-10-CM | POA: Diagnosis not present

## 2016-02-22 DIAGNOSIS — M19042 Primary osteoarthritis, left hand: Secondary | ICD-10-CM | POA: Diagnosis not present

## 2016-02-22 LAB — URIC ACID: URIC ACID, SERUM: 7 mg/dL (ref 2.5–7.0)

## 2016-02-22 MED ORDER — PREDNISONE 5 MG PO TABS: ORAL_TABLET | ORAL | 0 refills | Status: DC

## 2016-02-22 NOTE — Progress Notes (Signed)
Office Visit Note  Patient: Amanda Hammond             Date of Birth: 10-27-1970           MRN: [id number]             PCP: Deloria Lair, MD Referring: Deloria Lair., MD Visit Date: 02/22/2016 Occupation: '@GUAROCC' @    Subjective:  Pain of the Left Hand (Pain in 3rd finger) and Follow-up   History of Present Illness: Amanda Hammond is a 45 y.o. female  Since the last visit on 08/10/2015, patient is been having left third finger with decreased range of motion at times. She does not have any falls or any injuries. She does state that it gets warm at times and it does swell at times. She does state that it is hard to open it up at times and she has to manually extend flex finger out. She states that she has to do that with all of her fingers and her left hands from time to time.  Her fibromyalgia discomfort is still rated 10 on a scale of 0-10 as well as her fatigue. She has not had any change in her symptoms since the last visit as it relates to her fibromyalgia. She does carry a diagnosis of osteoarthritis of the hands treated knee joint which is ongoing. She has had autoimmune workup done 06/22/2015. Please see the labs in detail in finding. All of her autoimmune workup was negative.    Activities of Daily Living:   Patient reports morning stiffness for 30 minutes.   Patient Reports nocturnal pain.  Difficulty dressing/grooming: Reports Difficulty climbing stairs: Reports Difficulty getting out of chair: Reports Difficulty using hands for taps, buttons, cutlery, and/or writing: Reports   Review of Systems  Constitutional: Positive for fatigue.  HENT: Negative for mouth sores and mouth dryness.   Eyes: Negative for dryness.  Respiratory: Negative for shortness of breath.   Gastrointestinal: Negative for constipation and diarrhea.  Musculoskeletal: Positive for myalgias and myalgias.  Skin: Negative for sensitivity to sunlight.  Psychiatric/Behavioral: Positive  for sleep disturbance. Negative for decreased concentration.    PMFS History:  Patient Active Problem List   Diagnosis Date Noted  . Fibromyalgia 02/17/2016  . Other insomnia 02/17/2016  . Fatigue 02/17/2016  . B12 DEFICIENCY 02/01/2010  . VITAMIN D DEFICIENCY 02/01/2010  . ANEMIA, IRON DEFICIENCY 12/30/2009  . OTHER DYSPNEA AND RESPIRATORY ABNORMALITIES 12/30/2009  . Nonspecific (abnormal) findings on radiological and other examination of body structure 12/30/2009  . COMPUTERIZED TOMOGRAPHY, CHEST, ABNORMAL 12/30/2009    Past Medical History:  Diagnosis Date  . Anemia   . Hypertension   . Osteoarthritis   . PONV (postoperative nausea and vomiting)   . Vitamin D deficiency     Family History  Problem Relation Age of Onset  . Diabetes Mother   . Hypertension Mother   . Hypertension Father   . Anesthesia problems Neg Hx   . Hypotension Neg Hx   . Pseudochol deficiency Neg Hx   . Malignant hyperthermia Neg Hx    Past Surgical History:  Procedure Laterality Date  . APPENDECTOMY  2010   [facility]  . CARPAL TUNNEL RELEASE Left   . CHOLECYSTECTOMY  1999   [facility], [place]  . GASTRIC BYPASS  2003   [facility]  . HERNIA REPAIR  08/2010, 02/2010    incisional, [facility], [place]  . INCISIONAL HERNIA REPAIR  10/12/2010   Procedure: HERNIA REPAIR INCISIONAL;  Surgeon: Elta Guadeloupe  A Jenkins;  Location: [facility];  Service: General;  Laterality: N/A;  Recurrent Incisional Hernia Repair with Mesh  . PANNICULECTOMY  2008   [place]  . TENNIS ELBOW RELEASE/NIRSCHEL PROCEDURE Bilateral    Social History   Social History Narrative  . No narrative on file     Objective: Vital Signs: BP 138/89 (BP Location: Left Wrist, Patient Position: Sitting, Cuff Size: Large)   Pulse 96   Resp 14   Ht '5\' 6"'  (1.676 m)   Wt 237 lb (107.5 kg)   BMI 38.25 kg/m    Physical Exam  Constitutional: She is oriented to person, place, and time. She appears well-developed and well-nourished.  HENT:  Head: Normocephalic and atraumatic.    Eyes: EOM are normal. Pupils are equal, round, and reactive to light.  Cardiovascular: Normal rate, regular rhythm and normal heart sounds.  Exam reveals no gallop and no friction rub.   No murmur heard. Pulmonary/Chest: Effort normal and breath sounds normal. She has no wheezes. She has no rales.  Abdominal: Soft. Bowel sounds are normal. She exhibits no distension. There is no tenderness. There is no guarding. No hernia.  Musculoskeletal: Normal range of motion. She exhibits no edema, tenderness or deformity.  Lymphadenopathy:    She has no cervical adenopathy.  Neurological: She is alert and oriented to person, place, and time. Coordination normal.  Skin: Skin is warm and dry. Capillary refill takes less than 2 seconds. No rash noted.  Psychiatric: She has a normal mood and affect. Her behavior is normal.     Musculoskeletal Exam:  Full range of motion of all joints except cannot flex her right wrist or pronate her right elbow joint secondary to pain. Grip strength is decreased on the right secondary to patient wearing wrist brace for carpal tunnel Fiber myalgia tender points are 18 out of 18 positive  CDAI Exam: CDAI Homunculus Exam:   Tenderness:  Right hand: 2nd MCP and 3rd MCP Left hand: 2nd MCP and 3rd MCP  Swelling:  Right hand: 2nd MCP and 3rd MCP Left hand: 2nd MCP and 3rd MCP  Joint Counts:  CDAI Tender Joint count: 4 CDAI Swollen Joint count: 4  Global Assessments:  Patient Global Assessment: 10 Provider Global Assessment: 10  CDAI Calculated Score: 28    Investigation: No additional findings.  Labs from June 22, 2015 shows CBC with diff normal, CMP is normal, sed rate normal at 12.  CK is normal.  TSH is normal.  Urinalysis is normal.  Uric acid is normal at 7.1.  Angiotensin 1 converting enzyme is normal.  ANA is negative.  CCP is negative.  14-33 ETTA is normal.  HLA-B27 is negative.  Hepatitis panel is negative.  G6-PD is negative.  Immunoglobulins are  normal.  SPEP, M-spike is negative.  TB-Gold is negative.  Ultrasound is negative except for carpal tunnel median nerve is enlarged.        Imaging: No results found.  Speciality Comments: No specialty comments available.    Procedures:  No procedures performed Allergies: Patient has no known allergies.   Assessment / Plan:     Visit Diagnoses: Bilateral hand pain - Plan: Uric acid, Cyclic citrul peptide antibody, IgG  Fibromyalgia  Other insomnia  Chronic fatigue  Osteoarthritis of both hands, unspecified osteoarthritis type  Osteoarthritis of both knees, unspecified osteoarthritis type   Patient is having synovial thickening/tenderness along her second and third MCP joints bilaterally with left worse than right. As a result of the  above I plan to repeat some autoimmune labs.   Temporarily I'll give her prednisone taper  We want to do ultrasound of bilateral hands as soon as possible if patient is having synovitis occurring. The last ultrasound of the hands was negative.  Note that patient's fibromyalgia is rated 10 on a scale of 0-10; fatigue is rated 10 on a scale of 0-10; has carpal tunnel syndrome for which she is wearing a right wrist brace. She is right-handed but because she is wearing the right wrist spray she is not using the right hand as much and therefore she is probably flaring of her left hand because of that.  She has a history of OA of the hands feet and knee joint. Autoimmune workup 06/22/2015 was negative. As a result of her autoimmune workup negative in March 2017, I will order uric acid and CCP to update patient's current pain in her second and third bilateral MCP joint. Differential diagnosis of this pain includes osteoarthritis of her hands and fibromyalgia flare. Patient is aware that the prednisone taper we give her will not be refilled in the future for fibromyalgia flare.  Orders: Orders Placed This Encounter  Procedures  . Uric acid  .  Cyclic citrul peptide antibody, IgG   Meds ordered this encounter  Medications  . predniSONE (DELTASONE) 5 MG tablet    Sig: 4po qAM x 5 days, 3po qAM x 5 days, 2po qAM x 5 days, 1po qAM x 5 days, 1/2po qAM x 5 days, then stop.    Dispense:  53 tablet    Refill:  0    Order Specific Question:   Supervising Provider    Answer:   Bo Merino [phone]    Face-to-face time spent with patient was 30 minutes. 50% of time was spent in counseling and coordination of care.  Follow-Up Instructions: Return in about 3 months (around 05/24/2016) for Fibromyalga, bilatera hand and wrist pain, fatiigue, insomnia.   Eliezer Lofts, PA-C   I examined and evaluated the patient with Eliezer Lofts PA. The plan of care was discussed as noted above.  Bo Merino, MD

## 2016-02-23 LAB — CYCLIC CITRUL PEPTIDE ANTIBODY, IGG

## 2016-02-23 NOTE — Progress Notes (Signed)
Informed patient#1: CCP is negative#2: Uric acid is normal at 7.0 Advised patient that there will be No change in treatment

## 2016-03-29 ENCOUNTER — Telehealth: Payer: Self-pay | Admitting: Rheumatology

## 2016-03-29 NOTE — Telephone Encounter (Signed)
Patient finished prednisone the second week in December and had some improvement while on it but now she is having flares and pain again.  Patient uses [facility].

## 2016-03-30 NOTE — Telephone Encounter (Signed)
Plan:#1: Patient is scheduled to be seen 05/24/2016 on a Wednesday. Please be sure it is for afternoon when Dr. Corliss Skains can do an ultrasound. If is not scheduled for ultrasound, please schedule it as ultrasound and a normal visit (30 minutes long). Also, is there an opening in January, second or third week, for an ultrasound on Wednesday.#2: I would like the patient to try the following medicine for the next one month: Colcrys 0.6 mg daily; dispensed 30 pills with 1 refill.#3: I will also do a short prednisone taper in the meanwhile since patient is flaring and she did receive benefit from prednisone. Please order the following:  OK to Prescribe Prednisone 5mg :  4po qAM x 2 days,  3po qAM x 2 days, 2po qAM x 2 days, 1po qAM x 2 days, then stop. disp 20 pills w/ no refills.

## 2016-03-30 NOTE — Telephone Encounter (Signed)
Patient states she saw some improvement in her hands when she was on the prednisone. Patient states about 2 days after stopping the prednisone she started having pain in the her second and third finger on her left hand. Patient states her hand is inflamed and hurting.

## 2016-03-30 NOTE — Telephone Encounter (Signed)
Attempted to contact the patient and left message for patient to call the office.  

## 2016-04-05 NOTE — Telephone Encounter (Signed)
Attempted to contact the patient multiple times today. Recording states patient is unavailable right now.

## 2016-04-06 ENCOUNTER — Telehealth: Payer: Self-pay | Admitting: Rheumatology

## 2016-04-06 MED ORDER — COLCHICINE 0.6 MG PO TABS: 0.6000 mg | ORAL_TABLET | Freq: Every day | ORAL | 1 refills | Status: DC

## 2016-04-06 MED ORDER — PREDNISONE 5 MG PO TABS: ORAL_TABLET | ORAL | 0 refills | Status: DC

## 2016-04-06 NOTE — Addendum Note (Signed)
Addended by: Henriette Combs on: 04/06/2016 02:02 PM   Modules accepted: Orders

## 2016-04-06 NOTE — Telephone Encounter (Signed)
Patient called to see if Panwala received a message about her last week. Please call patient at number provided [phone]

## 2016-04-06 NOTE — Telephone Encounter (Signed)
Patient returned call and advised of recommendations. Prescriptions sent to the pharmacy. Message sent to the front to schedule patient for and ultrasound on follow up appointment on the same day at the end of January.

## 2016-04-06 NOTE — Telephone Encounter (Signed)
See previous note

## 2016-04-19 ENCOUNTER — Ambulatory Visit: Payer: BLUE CROSS/BLUE SHIELD | Admitting: Rheumatology

## 2016-04-25 DIAGNOSIS — M19072 Primary osteoarthritis, left ankle and foot: Secondary | ICD-10-CM

## 2016-04-25 DIAGNOSIS — M19071 Primary osteoarthritis, right ankle and foot: Secondary | ICD-10-CM | POA: Insufficient documentation

## 2016-04-25 DIAGNOSIS — M170 Bilateral primary osteoarthritis of knee: Secondary | ICD-10-CM | POA: Insufficient documentation

## 2016-04-25 DIAGNOSIS — M19041 Primary osteoarthritis, right hand: Secondary | ICD-10-CM | POA: Insufficient documentation

## 2016-04-25 DIAGNOSIS — M79642 Pain in left hand: Secondary | ICD-10-CM | POA: Insufficient documentation

## 2016-04-25 DIAGNOSIS — M79641 Pain in right hand: Secondary | ICD-10-CM | POA: Insufficient documentation

## 2016-04-25 DIAGNOSIS — M19042 Primary osteoarthritis, left hand: Secondary | ICD-10-CM

## 2016-04-25 NOTE — Progress Notes (Signed)
Office Visit Note  Patient: Amanda Hammond             Date of Birth: Mar 29, 1971           MRN: [id number]             PCP: Deloria Lair, MD Referring: Deloria Lair., MD Visit Date: 04/26/2016 Occupation: _0 @    Subjective: pain hands   History of Present Illness: Amanda Hammond is a 46 y.o. female with history of fibromyalgia and osteoarthritis she states she continues to have a lot of pain and discomfort in her bilateral hands. She had right lateral epicondyle surgery about 2 years ago arm. Now she is having problems with her right medial epicondyle area. She states she went to see her orthopedic surgeon needed and evaluated her and gave her a compression bandage around her elbow he also aspirated her elbow per patient. She also is having some discomfort in her knee joints. The right knee is more painful than the left. She continues to have discomfort from fibromyalgia with generalized pain. On a scale of 0-10 she describes her pain to be at 9 and fatigue at 10. Complains of left third trigger finger.  Activities of Daily Living:  Patient reports morning stiffness for 1 hour.   Patient Reports nocturnal pain.  Difficulty dressing/grooming: Denies Difficulty climbing stairs: Reports Difficulty getting out of chair: Reports Difficulty using hands for taps, buttons, cutlery, and/or writing: Reports   Review of Systems  Constitutional: Positive for fatigue. Negative for night sweats, weight gain, weight loss and weakness.  HENT: Positive for mouth dryness. Negative for mouth sores, trouble swallowing, trouble swallowing and nose dryness.   Eyes: Negative for pain, redness, visual disturbance and dryness.  Respiratory: Negative for cough, shortness of breath and difficulty breathing.   Cardiovascular: Negative for chest pain, palpitations, hypertension, irregular heartbeat and swelling in legs/feet.  Gastrointestinal: Negative for blood in stool, constipation and diarrhea.   Endocrine: Negative for increased urination.  Genitourinary: Negative for vaginal dryness.  Musculoskeletal: Positive for arthralgias, joint pain, myalgias, morning stiffness and myalgias. Negative for joint swelling, muscle weakness and muscle tenderness.  Skin: Negative for color change, rash, hair loss, skin tightness, ulcers and sensitivity to sunlight.  Allergic/Immunologic: Negative for susceptible to infections.  Neurological: Negative for dizziness, memory loss and night sweats.  Hematological: Negative for swollen glands.  Psychiatric/Behavioral: Positive for depressed mood and sleep disturbance. The patient is nervous/anxious.     PMFS History:  Patient Active Problem List   Diagnosis Date Noted  . Pain in left hand 04/25/2016  . Pain in right hand 04/25/2016  . Primary osteoarthritis of both hands 04/25/2016  . Primary osteoarthritis of both knees 04/25/2016  . Primary osteoarthritis of both feet 04/25/2016  . Fibromyalgia 02/17/2016  . Other insomnia 02/17/2016  . Fatigue 02/17/2016  . B12 DEFICIENCY 02/01/2010  . VITAMIN D DEFICIENCY 02/01/2010  . ANEMIA, IRON DEFICIENCY 12/30/2009  . OTHER DYSPNEA AND RESPIRATORY ABNORMALITIES 12/30/2009  . Nonspecific (abnormal) findings on radiological and other examination of body structure 12/30/2009  . COMPUTERIZED TOMOGRAPHY, CHEST, ABNORMAL 12/30/2009    Past Medical History:  Diagnosis Date  . Anemia   . Fibromyositis   . Hypertension   . Osteoarthritis   . PONV (postoperative nausea and vomiting)   . Vitamin D deficiency     Family History  Problem Relation Age of Onset  . Diabetes Mother   . Hypertension Mother   . Hypertension Father   .  Anesthesia problems Neg Hx   . Hypotension Neg Hx   . Pseudochol deficiency Neg Hx   . Malignant hyperthermia Neg Hx    Past Surgical History:  Procedure Laterality Date  . APPENDECTOMY  2010   [facility]  . CARPAL TUNNEL RELEASE Left   . CHOLECYSTECTOMY  1999   [facility], [place]  .  GASTRIC BYPASS  2003   [facility]  . HERNIA REPAIR  08/2010, 02/2010    incisional, [facility], [place]  . INCISIONAL HERNIA REPAIR  10/12/2010   Procedure: HERNIA REPAIR INCISIONAL;  Surgeon: Jamesetta So;  Location: [facility];  Service: General;  Laterality: N/A;  Recurrent Incisional Hernia Repair with Mesh  . PANNICULECTOMY  2008   [facility]  . TENNIS ELBOW RELEASE/NIRSCHEL PROCEDURE Bilateral    Social History   Social History Narrative  . No narrative on file     Objective: Vital Signs: BP 117/81 (BP Location: Left Arm, Patient Position: Sitting, Cuff Size: Normal)   Pulse 100   Resp 12   Ht _0  (1.651 m)   Wt 238 lb (108 kg)   BMI 39.61 kg/m    Physical Exam  Constitutional: She is oriented to person, place, and time. She appears well-developed and well-nourished.  HENT:  Head: Normocephalic and atraumatic.  Eyes: Conjunctivae and EOM are normal.  Neck: Normal range of motion.  Cardiovascular: Normal rate, regular rhythm, normal heart sounds and intact distal pulses.   Pulmonary/Chest: Effort normal and breath sounds normal.  Abdominal: Soft. Bowel sounds are normal.  Lymphadenopathy:    She has no cervical adenopathy.  Neurological: She is alert and oriented to person, place, and time.  Skin: Skin is warm and dry. Capillary refill takes less than 2 seconds.  Psychiatric: She has a normal mood and affect. Her behavior is normal.  Nursing note and vitals reviewed.    Musculoskeletal Exam: C-spine, thoracic spine good range of motion she has discomfort range of motion of her lumbar spine. Shoulder joints elbow joints wrist joint MCPs PIPs were good range of motion she has thickening of PIP/DIP joints bilaterally with no synovitis. She is tenderness on palpation over right lateral and medial epicondyle area. Hip joints knee joints ankles MTPs PIPs with good range of motion. Fibromyalgia tender points were 18 out of 18 positive she has generalized hyperalgesia.  CDAI Exam: No CDAI exam  completed.    Investigation: Findings:  02/22/2016 uric acid 7.0 CCP negative 06/22/2015 CBC hemoglobin 11.6, comprehensive metabolic panel normal, ESR 12, hep panel negative, G6PD normal, uric acid 7.1, CK normal, Ace ORIF 5, TSH normal, immunoglobulins IgM low at 45 ANA negative, SPEP negative. UA negative, CCP negative, HLA-B27 negative, 14 33 eta negative, TB gold negative   No visits with results within 2 Month(s) from this visit.  Latest known visit with results is:  Office Visit on 02/22/2016  Component Date Value Ref Range Status  . Uric Acid, Serum 02/22/2016 7.0  2.5 - 7.0 mg/dL Final  . Cyclic Citrullin Peptide Ab 02/22/2016 <16  Units Final   Comment:   Reference Range Negative               < 20 Weak Positive            20 - 39 Moderate Positive        40 - 59 Strong Positive        > 59    Imaging: No results found.  Speciality Comments: No specialty comments available.  Procedures:  No procedures performed Allergies: Patient has no known allergies.   Assessment / Plan:     Visit Diagnoses: Fibromyalgia: She is having a flare with increased pain all over. She has multiple tender points hyperalgesia.  Other fatigue: She reports extreme fatigue due to insomnia.  Other insomnia: She has chronic insomnia due to generalized pain and also primary insomnia.  Pain in hands she complains of some intermittent pain and swelling in her hands. She had left third trigger finger. I will schedule ultrasound guided injection for that. I also obtain ultrasound examination of her bilateral hands today which showed no synovitis. Her examination is consistent with osteoarthritis only. Ultrasound examination performed today of bilateral hands did not show any synovitis. All autoimmune workup has been negative.  Primary osteoarthritis of both hands  Primary osteoarthritis of both knees: She's having some chronic discomfort.  Primary osteoarthritis of both feet  Pain in both  hands - Plan: [place] Extrem Up Bilat Comp   Obesity: Weight loss diet and exercise and muscle strengthening was discussed.   Orders: Orders Placed This Encounter  Procedures  . [place] Extrem Up Bilat Comp   No orders of the defined types were placed in this encounter.   Face-to-face time spent with patient was 35 minutes. 50% of time was spent in counseling and coordination of care.  Follow-Up Instructions: Return in about 6 months (around 10/24/2016) for Fibromyalgia, osteoarthritis.   Bo Merino, MD  Note - This record has been created using Editor, commissioning.  Chart creation errors have been sought, but may not always  have been located. Such creation errors do not reflect on  the standard of medical care.

## 2016-04-25 NOTE — Progress Notes (Signed)
Psychiatric Initial Adult Assessment   Patient Identification: Amanda Hammond MRN:  [id number] Date of Evaluation:  04/27/2016 Referral Source: Louie Boston, MD Chief Complaint:   Chief Complaint    Depression; Anxiety; New Evaluation     Visit Diagnosis:    ICD-9-CM ICD-10-CM   1. Major depressive disorder, recurrent episode, moderate (HCC) 296.32 F33.1     History of Present Illness:   Amanda Hammond is a 46 year old female with hypertension, pulmonary embolus, polyarthralgia, who is referred for depression.   She states that she has had worsening depression over the past few months. She had a surgery for her carpal tunnel and for elbow/tendon. She was diagnosed fibromyalgia and endorses pain on her entire body. Although she used to like to go shopping and she was active, she tends to stay in the house most of the time due to fatigue. She quit her job, although she used to love her job. She reports financial strain. She also talks about loss of her father unexpectedly in Nov 2017. She has not had a time to process her loss, as she "need to be strong" for her children. She feels frustrated that her "health is going down, " although she has "good life" and has good relationship with her husband and her children.   She endorses insomnia with night time awakening due to pain. She endorses anhedonia and low energy. She feels irritable and occasionally snaps at her husband. She stopped going to grocery shopping due to her anxiety. She has panic attack a couple times per month. She reports passive SI. She denies decreased need for sleep or euphoria. She denies HI, AH, VH. She denies alcohol use or drug use.  Associated Signs/Symptoms: Depression Symptoms:  depressed mood, anhedonia, insomnia, fatigue, feelings of worthlessness/guilt, difficulty concentrating, suicidal thoughts without plan, anxiety, (Hypo) Manic Symptoms:  denies Anxiety Symptoms:  Excessive Worry, Panic  Symptoms, Psychotic Symptoms:  denies PTSD Symptoms: Negative  Past Psychiatric History:  Outpatient: denies  Psychiatry admission: denies Previous suicide attempt: denies  Past trials of medication: Wellbutrin (15 years),  History of violence: denies  Previous Psychotropic Medications: Yes   Substance Abuse History in the last 12 months:  No.  Consequences of Substance Abuse: NA  Past Medical History:  Past Medical History:  Diagnosis Date  . Anemia   . Fibromyositis   . Hypertension   . Osteoarthritis   . PONV (postoperative nausea and vomiting)   . Vitamin D deficiency     Past Surgical History:  Procedure Laterality Date  . APPENDECTOMY  2010   [facility]  . CARPAL TUNNEL RELEASE Left   . CHOLECYSTECTOMY  1999   [facility], [facility]  . GASTRIC BYPASS  2003   [facility]  . HERNIA REPAIR  08/2010, 02/2010    [facility]  . INCISIONAL HERNIA REPAIR  10/12/2010   Procedure: HERNIA REPAIR INCISIONAL;  Surgeon: Dalia Heading;  Location: [facility];  Service: General;  Laterality: N/A;  Recurrent Incisional Hernia Repair with Mesh  . PANNICULECTOMY  2008   [facility]  . TENNIS ELBOW RELEASE/NIRSCHEL PROCEDURE Bilateral     Family Psychiatric History:  denies  Family History:  Family History  Problem Relation Age of Onset  . Diabetes Mother   . Hypertension Mother   . Hypertension Father   . Anesthesia problems Neg Hx   . Hypotension Neg Hx   . Pseudochol deficiency Neg Hx   . Malignant hyperthermia Neg Hx     Social  History:   Social History   Social History  . Marital status: Married    Spouse name: N/A  . Number of children: N/A  . Years of education: N/A   Social History Main Topics  . Smoking status: Never Smoker  . Smokeless tobacco: Never Used  . Alcohol use No     Comment: 04-27-2016 Per pt no  . Drug use: No     Comment: 04-27-2016.per pt no  . Sexual activity: Not Asked   Other Topics Concern  . None   Social History Narrative  . None    Additional  Social History:  Two children, age 52 and 99,  Lives with her husband, children and her mother Used to work as Water quality scientist until  2017 for 23 years  Allergies:  No Known Allergies  Metabolic Disorder Labs: No results found for: HGBA1C, MPG No results found for: PROLACTIN No results found for: CHOL, TRIG, HDL, CHOLHDL, VLDL, LDLCALC   Current Medications: Current Outpatient Prescriptions  Medication Sig Dispense Refill  . buPROPion (WELLBUTRIN XL) 300 MG 24 hr tablet Take 300 mg by mouth daily.     . colchicine 0.6 MG tablet Take 1 tablet (0.6 mg total) by mouth daily. 30 tablet 1  . ibuprofen (ADVIL,MOTRIN) 200 MG tablet Take 400 mg by mouth every 6 (six) hours as needed. For pain     . lisinopril-hydrochlorothiazide (PRINZIDE,ZESTORETIC) 10-12.5 MG tablet Take 1 tablet by mouth daily.    Marland Kitchen LYRICA 100 MG capsule     . Multiple Vitamins-Minerals (MULTIVITAMIN WITH MINERALS) tablet Take 1 tablet by mouth daily.      . NUCYNTA 100 MG TABS     . NUCYNTA ER 100 MG 12 hr tablet     . tiZANidine (ZANAFLEX) 4 MG tablet     . DULoxetine (CYMBALTA) 30 MG capsule Take 1 capsule (30 mg total) by mouth daily. 30 capsule 0   No current facility-administered medications for this visit.     Neurologic: Headache: No Seizure: No Paresthesias:No  Musculoskeletal: Strength & Muscle Tone: within normal limits Gait & Station: normal Patient leans: N/A  Psychiatric Specialty Exam: Review of Systems  Musculoskeletal: Positive for joint pain and myalgias.  Neurological: Positive for sensory change.  Psychiatric/Behavioral: Positive for depression and suicidal ideas. Negative for hallucinations and substance abuse. The patient is nervous/anxious and has insomnia.   All other systems reviewed and are negative.   Blood pressure (!) 132/91, pulse 86, height 5\' 5"  (1.651 m), weight 239 lb 9.6 oz (108.7 kg).Body mass index is 39.87 kg/m.  General Appearance: Fairly Groomed  Eye Contact:  Good   Speech:  Clear and Coherent  Volume:  Normal  Mood:  Depressed  Affect:  Restricted and Tearful  Thought Process:  Coherent and Goal Directed  Orientation:  Full (Time, Place, and Person)  Thought Content:  Logical Perceptions: denies AH/VH  Suicidal Thoughts:  Yes.  without intent/plan  Homicidal Thoughts:  No  Memory:  Immediate;   Good Recent;   Good Remote;   Good  Judgement:  Good  Insight:  Fair  Psychomotor Activity:  Normal  Concentration:  Concentration: Good and Attention Span: Good  Recall:  Good  Fund of Knowledge:Good  Language: Good  Akathisia:  No  Handed:  Right  AIMS (if indicated):  N/A  Assets:  Communication Skills Desire for Improvement  ADL's:  Intact  Cognition: WNL  Sleep:  poor   Assessment Amanda Hammond is a 46 year old female  with hypertension, pulmonary embolus, polyarthralgia, who is referred for depression. Psychosocial stressors including chronic pain, unemployment, financial strain and loss of her father in Nov 2017.   # MDD Patient endorses neurovegetative symptoms and reports limited benefit from Wellbutrin. Will add duloxetine to target her mood and pain. Side effects including nausea, vomiting, sexual dysfunction, hypertension are discussed. She will greatly benefit from CBT and supportive therapy given her recent loss; will make referral.   Plan 1. Start duloxetine 30 mg daily 2. Continue Wellbutrin 300 mg daily 3. Return to clinic in one month 4. Contact for therapy: Dr. Daisy Blossom Schneidmiller  [phone] [address] [place], [place] [address]  The patient demonstrates the following risk factors for suicide: Chronic risk factors for suicide include: psychiatric disorder of depression and chronic pain. Acute risk factors for suicide include: unemployment and loss (financial, interpersonal, professional). Protective factors for this patient include: positive social support, responsibility to others (children, family), coping  skills and hope for the future. Considering these factors, the overall suicide risk at this point appears to be low. Patient is appropriate for outpatient follow up.   Treatment Plan Summary: Plan as above   Neysa Hotter, MD 1/25/20184:33 PM

## 2016-04-26 ENCOUNTER — Ambulatory Visit (INDEPENDENT_AMBULATORY_CARE_PROVIDER_SITE_OTHER): Payer: BLUE CROSS/BLUE SHIELD | Admitting: Rheumatology

## 2016-04-26 ENCOUNTER — Encounter: Payer: Self-pay | Admitting: Rheumatology

## 2016-04-26 ENCOUNTER — Inpatient Hospital Stay (INDEPENDENT_AMBULATORY_CARE_PROVIDER_SITE_OTHER): Payer: Self-pay

## 2016-04-26 VITALS — BP 117/81 | HR 100 | Resp 12 | Ht 65.0 in | Wt 238.0 lb

## 2016-04-26 DIAGNOSIS — M19071 Primary osteoarthritis, right ankle and foot: Secondary | ICD-10-CM

## 2016-04-26 DIAGNOSIS — M19042 Primary osteoarthritis, left hand: Secondary | ICD-10-CM

## 2016-04-26 DIAGNOSIS — R5383 Other fatigue: Secondary | ICD-10-CM

## 2016-04-26 DIAGNOSIS — M170 Bilateral primary osteoarthritis of knee: Secondary | ICD-10-CM | POA: Diagnosis not present

## 2016-04-26 DIAGNOSIS — M79641 Pain in right hand: Secondary | ICD-10-CM

## 2016-04-26 DIAGNOSIS — M797 Fibromyalgia: Secondary | ICD-10-CM

## 2016-04-26 DIAGNOSIS — G4709 Other insomnia: Secondary | ICD-10-CM

## 2016-04-26 DIAGNOSIS — M19041 Primary osteoarthritis, right hand: Secondary | ICD-10-CM | POA: Diagnosis not present

## 2016-04-26 DIAGNOSIS — M19072 Primary osteoarthritis, left ankle and foot: Secondary | ICD-10-CM | POA: Diagnosis not present

## 2016-04-26 DIAGNOSIS — M79642 Pain in left hand: Secondary | ICD-10-CM

## 2016-04-26 NOTE — Patient Instructions (Signed)
Supplements for OA Natural anti-inflammatories  You can purchase these at [employer], [employer] or online.  . Turmeric (capsules)  . Ginger (ginger root or capsules)  . Omega 3 (Fish, flax seeds, chia seeds, walnuts, almonds)  . Tart cherry (dried or extract)   Patient should be under the care of a physician while taking these supplements. This may not be reproduced without the permission of Dr. Denver Harder.  

## 2016-04-27 ENCOUNTER — Encounter (HOSPITAL_COMMUNITY): Payer: Self-pay | Admitting: Psychiatry

## 2016-04-27 ENCOUNTER — Ambulatory Visit (INDEPENDENT_AMBULATORY_CARE_PROVIDER_SITE_OTHER): Payer: BLUE CROSS/BLUE SHIELD | Admitting: Psychiatry

## 2016-04-27 VITALS — BP 132/91 | HR 86 | Ht 65.0 in | Wt 239.6 lb

## 2016-04-27 DIAGNOSIS — Z9049 Acquired absence of other specified parts of digestive tract: Secondary | ICD-10-CM | POA: Diagnosis not present

## 2016-04-27 DIAGNOSIS — R45851 Suicidal ideations: Secondary | ICD-10-CM

## 2016-04-27 DIAGNOSIS — Z9889 Other specified postprocedural states: Secondary | ICD-10-CM

## 2016-04-27 DIAGNOSIS — Z833 Family history of diabetes mellitus: Secondary | ICD-10-CM

## 2016-04-27 DIAGNOSIS — F331 Major depressive disorder, recurrent, moderate: Secondary | ICD-10-CM | POA: Diagnosis not present

## 2016-04-27 DIAGNOSIS — Z8249 Family history of ischemic heart disease and other diseases of the circulatory system: Secondary | ICD-10-CM

## 2016-04-27 DIAGNOSIS — Z79899 Other long term (current) drug therapy: Secondary | ICD-10-CM

## 2016-04-27 MED ORDER — DULOXETINE HCL 30 MG PO CPEP: 30.0000 mg | ORAL_CAPSULE | Freq: Every day | ORAL | 0 refills | Status: DC

## 2016-04-27 NOTE — Patient Instructions (Addendum)
1. Start duloxetine 30 mg daily 2. Continue Wellbutrin 300 mg daily 3. Return to clinic in one month 4. Contact for therapy: Dr. Daisy Blossom Schneidmiller  [phone] [address], [place], [place] [address]

## 2016-05-25 ENCOUNTER — Ambulatory Visit: Payer: BLUE CROSS/BLUE SHIELD | Admitting: Rheumatology

## 2016-05-30 ENCOUNTER — Ambulatory Visit (HOSPITAL_COMMUNITY): Payer: Self-pay | Admitting: Psychiatry

## 2016-06-12 NOTE — Progress Notes (Deleted)
Patient with history of fibromyalgia and osteoarthritis of her hands. Her ultrasound examination of her hands was negative for synovitis. She is scheduled to get right third trigger finger injection under ultrasound guidance.

## 2016-06-14 ENCOUNTER — Ambulatory Visit: Payer: BLUE CROSS/BLUE SHIELD | Admitting: Rheumatology

## 2016-07-10 NOTE — Progress Notes (Signed)
Assessment / Plan:     Visit Diagnoses: Fibromyalgia: She is having a flare with increased pain all over. She has multiple tender points hyperalgesia.  Other fatigue: She reports extreme fatigue due to insomnia.  Other insomnia: She has chronic insomnia due to generalized pain and also primary insomnia.  Pain in hands she complains of some intermittent pain and swelling in her hands. She had left third trigger finger. I will schedule ultrasound guided injection for that. I also obtain ultrasound examination of her bilateral hands today which showed no synovitis. Her examination is consistent with osteoarthritis only. Ultrasound examination performed today of bilateral hands did not show any synovitis. All autoimmune workup has been negative.  Primary osteoarthritis of both hands  Primary osteoarthritis of both knees: She's having some chronic discomfort.  Primary osteoarthritis of both feet  Pain in both hands - Plan: [place] Extrem Up Bilat Comp   Obesity: Weight loss diet and exercise and muscle strengthening was discussed.   Orders:    Orders Placed This Encounter  Procedures  . [place] Extrem Up Bilat Comp   No orders of the defined types were placed in this encounter.   Face-to-face time spent with patient was 35 minutes. 50% of time was spent in counseling and coordination of care.  Follow-Up Instructions: Return in about 6 months (around 10/24/2016) for Fibromyalgia, osteoarthritis.

## 2016-07-19 ENCOUNTER — Ambulatory Visit (INDEPENDENT_AMBULATORY_CARE_PROVIDER_SITE_OTHER): Payer: BLUE CROSS/BLUE SHIELD | Admitting: Rheumatology

## 2016-07-19 DIAGNOSIS — M65331 Trigger finger, right middle finger: Secondary | ICD-10-CM | POA: Diagnosis not present

## 2016-07-19 MED ORDER — TRIAMCINOLONE ACETONIDE 40 MG/ML IJ SUSP
10.0000 mg | INTRAMUSCULAR | Status: AC | PRN
  Administered 2016-07-19: 10 mg

## 2016-07-19 MED ORDER — LIDOCAINE HCL 1 % IJ SOLN
0.5000 mL | INTRAMUSCULAR | Status: AC | PRN
  Administered 2016-07-19: .5 mL

## 2016-07-19 NOTE — Progress Notes (Signed)
   Procedure Note  Patient: Amanda Hammond             Date of Birth: Oct 28, 1970           MRN: [id number]             Visit Date: 07/19/2016  Procedures: Visit Diagnoses: No diagnosis found.  Hand/UE Inj Date/Time: 07/19/2016 3:10 PM Performed by: Pollyann Savoy Authorized by: Pollyann Savoy   Consent Given by:  Patient Site marked: the procedure site was marked   Timeout: prior to procedure the correct patient, procedure, and site was verified   Indications:  Therapeutic and tendon swelling Condition: trigger finger   Location:  Long finger Site:  R long A1 Prep: patient was prepped and draped in usual sterile fashion   Needle Size:  27 G Approach:  Volar Ultrasound Guidance: Yes   Medications:  0.5 mL lidocaine 1 %; 10 mg triamcinolone acetonide 40 MG/ML Aspirate amount (mL):  0 Patient tolerance:  Patient tolerated the procedure well with no immediate complications   Pollyann Savoy, MD, [facility]

## 2016-08-10 ENCOUNTER — Telehealth: Payer: Self-pay | Admitting: Rheumatology

## 2016-08-10 NOTE — Telephone Encounter (Signed)
Patient having a lot of swelling with both hands, and feet. X3 days. Getting worse very uncomfortable. Please advise.

## 2016-08-11 NOTE — Telephone Encounter (Signed)
Patient states she is having swelling her ankles , legs and her feet. Patient states she has not had any changes to medications. Patient states her legs , feet and ankles are painful and tight.  Patient has been advised to be evaluated by PCP

## 2016-10-24 ENCOUNTER — Ambulatory Visit: Payer: BLUE CROSS/BLUE SHIELD | Admitting: Rheumatology

## 2016-11-06 DIAGNOSIS — G5601 Carpal tunnel syndrome, right upper limb: Secondary | ICD-10-CM | POA: Insufficient documentation

## 2016-11-06 DIAGNOSIS — M7062 Trochanteric bursitis, left hip: Secondary | ICD-10-CM

## 2016-11-06 DIAGNOSIS — G5602 Carpal tunnel syndrome, left upper limb: Secondary | ICD-10-CM | POA: Insufficient documentation

## 2016-11-06 DIAGNOSIS — Z8739 Personal history of other diseases of the musculoskeletal system and connective tissue: Secondary | ICD-10-CM | POA: Insufficient documentation

## 2016-11-06 DIAGNOSIS — M5030 Other cervical disc degeneration, unspecified cervical region: Secondary | ICD-10-CM | POA: Insufficient documentation

## 2016-11-06 DIAGNOSIS — M65332 Trigger finger, left middle finger: Secondary | ICD-10-CM | POA: Insufficient documentation

## 2016-11-06 DIAGNOSIS — R768 Other specified abnormal immunological findings in serum: Secondary | ICD-10-CM | POA: Insufficient documentation

## 2016-11-06 DIAGNOSIS — M7061 Trochanteric bursitis, right hip: Secondary | ICD-10-CM | POA: Insufficient documentation

## 2016-11-06 NOTE — Progress Notes (Deleted)
Office Visit Note  Patient: Amanda Hammond             Date of Birth: Nov 13, 1970           MRN: [id number]             PCP: Deloria Lair., MD Referring: Deloria Lair., MD Visit Date: 11/09/2016 Occupation: '@GUAROCC' @    Subjective:  No chief complaint on file.   History of Present Illness: Amanda Hammond is a 46 y.o. female ***   Activities of Daily Living:  Patient reports morning stiffness for *** {minute/hour:19697}.   Patient {ACTIONS;DENIES/REPORTS:21021675::"Denies"} nocturnal pain.  Difficulty dressing/grooming: {ACTIONS;DENIES/REPORTS:21021675::"Denies"} Difficulty climbing stairs: {ACTIONS;DENIES/REPORTS:21021675::"Denies"} Difficulty getting out of chair: {ACTIONS;DENIES/REPORTS:21021675::"Denies"} Difficulty using hands for taps, buttons, cutlery, and/or writing: {ACTIONS;DENIES/REPORTS:21021675::"Denies"}   No Rheumatology ROS completed.   PMFS History:  Patient Active Problem List   Diagnosis Date Noted  . Rheumatoid factor positive 11/06/2016  . Trochanteric bursitis of both hips 11/06/2016  . History of fibromyalgia 11/06/2016  . DDD (degenerative disc disease), cervical 11/06/2016  . Carpal tunnel syndrome, left upper limb 11/06/2016  . Carpal tunnel syndrome, right upper limb 11/06/2016  . Trigger finger, left middle finger 11/06/2016  . Major depressive disorder, recurrent episode, moderate ([place]) 04/27/2016  . Pain in left hand 04/25/2016  . Pain in right hand 04/25/2016  . Primary osteoarthritis of both hands 04/25/2016  . Primary osteoarthritis of both knees 04/25/2016  . Primary osteoarthritis of both feet 04/25/2016  . Fibromyalgia 02/17/2016  . Other insomnia 02/17/2016  . Fatigue 02/17/2016  . B12 DEFICIENCY 02/01/2010  . Vitamin D deficiency 02/01/2010  . ANEMIA, IRON DEFICIENCY 12/30/2009  . OTHER DYSPNEA AND RESPIRATORY ABNORMALITIES 12/30/2009  . Nonspecific (abnormal) findings on radiological and other examination of body  structure 12/30/2009  . COMPUTERIZED TOMOGRAPHY, CHEST, ABNORMAL 12/30/2009    Past Medical History:  Diagnosis Date  . Anemia   . Fibromyositis   . Hypertension   . Osteoarthritis   . PONV (postoperative nausea and vomiting)   . Vitamin D deficiency     Family History  Problem Relation Age of Onset  . Diabetes Mother   . Hypertension Mother   . Hypertension Father   . Anesthesia problems Neg Hx   . Hypotension Neg Hx   . Pseudochol deficiency Neg Hx   . Malignant hyperthermia Neg Hx    Past Surgical History:  Procedure Laterality Date  . APPENDECTOMY  2010   [facility]  . CARPAL TUNNEL RELEASE Left   . CHOLECYSTECTOMY  1999   [facility], [place]  . GASTRIC BYPASS  2003   [facility]  . HERNIA REPAIR  08/2010, 02/2010    incisional, [facility], [place]  . INCISIONAL HERNIA REPAIR  10/12/2010   Procedure: HERNIA REPAIR INCISIONAL;  Surgeon: Jamesetta So;  Location: [facility];  Service: General;  Laterality: N/A;  Recurrent Incisional Hernia Repair with Mesh  . PANNICULECTOMY  2008   [facility]  . TENNIS ELBOW RELEASE/NIRSCHEL PROCEDURE Bilateral    Social History   Social History Narrative  . No narrative on file     Objective: Vital Signs: There were no vitals taken for this visit.   Physical Exam   Musculoskeletal Exam: ***  CDAI Exam: No CDAI exam completed.    Investigation: Findings:  Labs from June 22, 2015 shows CBC with diff normal, CMP is normal, sed rate normal at 12.  CK is normal.  TSH is normal.  Urinalysis is normal.  Uric acid is normal  at 7.1.  Angiotensin 1 converting enzyme is normal.  ANA is negative.  CCP is negative.  14-33 ETTA is normal.  HLA-B27 is negative.  Hepatitis panel is negative.  G6-PD is negative.  Immunoglobulins are normal.  SPEP, M-spike is negative.  TB-Gold is negative.     February 2017:  B12 was normal.  Vitamin D was low at 28.7, rheumatoid factor 24, normal was 14.  Sed rate was 24, normal was 23.   06/22/2015 X-ray of bilateral hands 2 views today  showed minimal PIP narrowing, no osteopenia, no CP or intercarpal joint space narrowing was noted.  Bilateral foot x-ray showed minimal PIP narrowing, bilateral calcaneal spurs, no joint space narrowing or erosive changes were noted.  Bilateral knee joints showed bilateral moderate medial compartment narrowing without chondrocalcinosis and bilateral moderate patellofemoral narrowing consistent with moderate osteoarthritis and moderate chondromalacia patella.  07/21/2015 After informed consent was obtained, per EULAR recommendations, ultrasound examination of bilateral hands was performed.  Using 12 MHz transducer, grey scale and power Doppler, bilateral 2nd, 3rd and 5th MCP joints and bilateral wrist joints, both dorsal and volar aspects, were evaluated.  She had no synovitis on examination and no tenosynovitis.  Her median nerve was 0.19 cm 2, which was more than upper limits of normal.  Left was 0.14 cm 2, which was more than upper limits of normal.   07/19/2016 Pain in hands she complains of some intermittent pain and swelling in her hands. She had left third trigger finger. I will schedule ultrasound guided injection for that. I also obtain ultrasound examination of her bilateral hands today which showed no synovitis. Her examination is consistent with osteoarthritis only. Ultrasound examination performed today of bilateral hands did not show any synovitis. All autoimmune workup has been negative.    Imaging: No results found.  Speciality Comments: No specialty comments available.    Procedures:  No procedures performed Allergies: Patient has no known allergies.   Assessment / Plan:     Visit Diagnoses: DDD (degenerative disc disease), cervical  Primary osteoarthritis of both hands  Trigger finger, left middle finger  Carpal tunnel syndrome, left upper limb  Carpal tunnel syndrome, right upper limb  Primary osteoarthritis of both knees  Trochanteric bursitis of both hips  Primary  osteoarthritis of both feet  History of fibromyalgia  Other fatigue  Other insomnia  Vitamin D deficiency  Rheumatoid factor positive    Orders: No orders of the defined types were placed in this encounter.  No orders of the defined types were placed in this encounter.   Face-to-face time spent with patient was *** minutes. 50% of time was spent in counseling and coordination of care.  Follow-Up Instructions: No Follow-up on file.   Maurio Baize, RT  Note - This record has been created using [employer].  Chart creation errors have been sought, but may not always  have been located. Such creation errors do not reflect on  the standard of medical care.

## 2016-11-09 ENCOUNTER — Ambulatory Visit: Payer: BLUE CROSS/BLUE SHIELD | Admitting: Rheumatology

## 2017-01-04 ENCOUNTER — Encounter: Payer: Self-pay | Admitting: Cardiovascular Disease

## 2017-01-04 ENCOUNTER — Ambulatory Visit (INDEPENDENT_AMBULATORY_CARE_PROVIDER_SITE_OTHER): Payer: Self-pay | Admitting: Cardiovascular Disease

## 2017-01-04 ENCOUNTER — Telehealth: Payer: Self-pay | Admitting: Cardiovascular Disease

## 2017-01-04 VITALS — BP 132/82 | HR 95 | Ht 66.0 in | Wt 261.0 lb

## 2017-01-04 DIAGNOSIS — R251 Tremor, unspecified: Secondary | ICD-10-CM

## 2017-01-04 DIAGNOSIS — H539 Unspecified visual disturbance: Secondary | ICD-10-CM

## 2017-01-04 DIAGNOSIS — I10 Essential (primary) hypertension: Secondary | ICD-10-CM

## 2017-01-04 DIAGNOSIS — R55 Syncope and collapse: Secondary | ICD-10-CM

## 2017-01-04 NOTE — Telephone Encounter (Signed)
Pre-cert Verification for the following procedure   MR Brain w/o contrast  Scheduled for 01-10-17 Echo scheduled for 01-11-17

## 2017-01-04 NOTE — Patient Instructions (Signed)
Medication Instructions:  Continue all current medications.  Labwork: none  Testing/Procedures:  Your physician has requested that you have an echocardiogram. Echocardiography is a painless test that uses sound waves to create images of your heart. It provides your doctor with information about the size and shape of your heart and how well your heart's chambers and valves are working. This procedure takes approximately one hour. There are no restrictions for this procedure.  Your physician has recommended that you wear a 30 day event monitor. Event monitors are medical devices that record the heart's electrical activity. Doctors most often [place] these monitors to diagnose arrhythmias. Arrhythmias are problems with the speed or rhythm of the heartbeat. The monitor is a small, portable device. You can wear one while you do your normal daily activities. This is usually used to diagnose what is causing palpitations/syncope (passing out).  MRI of brain without contrast.  Office will contact with results via phone or letter.    Follow-Up: 6 weeks   Any Other Special Instructions Will Be Listed Below (If Applicable).  If you need a refill on your cardiac medications before your next appointment, please call your pharmacy.

## 2017-01-04 NOTE — Progress Notes (Signed)
CARDIOLOGY CONSULT NOTE  Patient ID: Amanda Hammond MRN: [id number] DOB/AGE: Feb 06, 1971 46 y.o.  Admit date: (Not on file) Primary Physician: Louie Boston., MD Referring Physician: Dr. Margo Common  Reason for Consultation: syncope  HPI: MILISSA Hammond is a 46 y.o. female who is being seen today for the evaluation of syncope at the request of Louie Boston., MD.   Past medical history includes hypertension, fibromyalgia, and osteoarthritis. She underwent gastric bypass surgery at [facility] in 2003. She underwent panniculectomy in [place] in 2008.  ECG performed on 12/20/16 which I personally interpreted demonstrated sinus rhythm with no gross ischemic abnormalities, nor any arrhythmias.  Chest x-ray on 12/15/16 showed no evidence of acute cardiopulmonary disease.  She was evaluated in the ED on 12/15/16 after sustaining a fall due to syncope.  She has had 3 syncopal episodes in the month of September. At the time of the first episode, she was standing at her kitchen sink and she suddenly passed out. She denies antecedent chest pain, palpitations, and shortness of breath. She denies a history of antecedent vertigo. She says she hydrates well.  2 days later while sitting on the toilet after urinating, she passed out and fell into the bathtub.  On 12/25/16, she was walking outside her house and suddenly lost consciousness.  Prior to these 3 episodes, she had never lost consciousness. She does describe some instability while walking and some dizziness in May 2018 but she never passed out.  She denies exertional chest pain and shortness of breath.  She has developed bilateral shaking of her hands in the past 7 months. Upon further discussion, it appears she is also had bilateral visual changes in the past 4 months. She has also noticed episodic staring episodes.  She has occasional headaches but they're not bothersome. She denies a history of migraines.  Family history: Father  died at the age of 54. He had atrial fibrillation. He had a syncopal episode 3 days before passing away. Her mother has a pacemaker placed at the age of 28. She also had a syncopal episode in her 35s. There is no known history of neurologic disorders.     No Known Allergies  Current Outpatient Prescriptions  Medication Sig Dispense Refill  . buPROPion (WELLBUTRIN XL) 300 MG 24 hr tablet Take 300 mg by mouth daily.     . colchicine 0.6 MG tablet Take 1 tablet (0.6 mg total) by mouth daily. 30 tablet 1  . DULoxetine (CYMBALTA) 30 MG capsule Take 1 capsule (30 mg total) by mouth daily. 30 capsule 0  . ibuprofen (ADVIL,MOTRIN) 200 MG tablet Take 400 mg by mouth every 6 (six) hours as needed. For pain     . lisinopril-hydrochlorothiazide (PRINZIDE,ZESTORETIC) 10-12.5 MG tablet Take 1 tablet by mouth daily.    Marland Kitchen LYRICA 100 MG capsule     . Multiple Vitamins-Minerals (MULTIVITAMIN WITH MINERALS) tablet Take 1 tablet by mouth daily.      . NUCYNTA 100 MG TABS     . NUCYNTA ER 100 MG 12 hr tablet     . tiZANidine (ZANAFLEX) 4 MG tablet      No current facility-administered medications for this visit.     Past Medical History:  Diagnosis Date  . Anemia   . Fibromyositis   . Hypertension   . Osteoarthritis   . PONV (postoperative nausea and vomiting)   . Vitamin D deficiency     Past Surgical History:  Procedure Laterality Date  .  APPENDECTOMY  2010   [facility]  . CARPAL TUNNEL RELEASE Left   . CHOLECYSTECTOMY  1999   lap, [facility]  . GASTRIC BYPASS  2003   [facility]  . HERNIA REPAIR  08/2010, 02/2010    incisional, [facility], [facility]  . INCISIONAL HERNIA REPAIR  10/12/2010   Procedure: HERNIA REPAIR INCISIONAL;  Surgeon: Dalia Heading;  Location: [facility];  Service: General;  Laterality: N/A;  Recurrent Incisional Hernia Repair with Mesh  . PANNICULECTOMY  2008   [facility]  . TENNIS ELBOW RELEASE/NIRSCHEL PROCEDURE Bilateral     Social History   Social History  . Marital status: Married    Spouse name:  N/A  . Number of children: N/A  . Years of education: N/A   Occupational History  . Not on file.   Social History Main Topics  . Smoking status: Never Smoker  . Smokeless tobacco: Never Used  . Alcohol use No     Comment: 04-27-2016 Per pt no  . Drug use: No     Comment: 04-27-2016.per pt no  . Sexual activity: Not on file   Other Topics Concern  . Not on file   Social History Narrative  . No narrative on file      Current Meds  Medication Sig  . buPROPion (WELLBUTRIN XL) 300 MG 24 hr tablet Take 300 mg by mouth daily.   . colchicine 0.6 MG tablet Take 1 tablet (0.6 mg total) by mouth daily.  . DULoxetine (CYMBALTA) 30 MG capsule Take 1 capsule (30 mg total) by mouth daily.  Marland Kitchen ibuprofen (ADVIL,MOTRIN) 200 MG tablet Take 400 mg by mouth every 6 (six) hours as needed. For pain   . lisinopril-hydrochlorothiazide (PRINZIDE,ZESTORETIC) 10-12.5 MG tablet Take 1 tablet by mouth daily.  Marland Kitchen LYRICA 100 MG capsule   . Multiple Vitamins-Minerals (MULTIVITAMIN WITH MINERALS) tablet Take 1 tablet by mouth daily.    . NUCYNTA 100 MG TABS   . NUCYNTA ER 100 MG 12 hr tablet   . tiZANidine (ZANAFLEX) 4 MG tablet       Review of systems complete and found to be negative unless listed above in HPI    Physical exam Blood pressure 132/82, pulse 95, height 5\' 6"  (1.676 m), weight 261 lb (118.4 kg), SpO2 97 %. General: NAD Neck: No JVD, no thyromegaly or thyroid nodule.  Lungs: Clear to auscultation bilaterally with normal respiratory effort. CV: Nondisplaced PMI. Regular rate and rhythm, normal S1/S2, no S3/S4, soft systolic murmur along left sternal border.  No peripheral edema.  No carotid bruit.    Abdomen: Soft, nontender, no distention.  Skin: Intact without lesions or rashes.  Neurologic: Alert and oriented x 3. Bilateral hand tremors at rest. Instability on standing with eyes closed. Psych: Normal affect. Extremities: No clubbing or cyanosis.  HEENT: Normal.   ECG: Most recent  ECG reviewed.   Labs: Lab Results  Component Value Date/Time   K 3.3 (L) 10/14/2010 04:53 AM   BUN 7 10/14/2010 04:53 AM   CREATININE 0.49 (L) 10/14/2010 04:53 AM   ALT 11 12/05/2009 07:21 PM   HGB 10.0 (L) 10/14/2010 04:53 AM     Lipids: No results found for: LDLCALC, LDLDIRECT, CHOL, TRIG, HDL      ASSESSMENT AND PLAN:  1. Syncope with progressive bilateral hand tremors and visual changes: With 3 episodes of syncope in a four-week time frame, I am concerned about both neurologic and cardiac disorders. I will obtain a 30 day event monitor to evaluate for  bradycardia and/or sinus pauses. I will also obtain an echocardiogram to evaluate cardiac structure and function. Given her bilateral hand tremors and visual changes occurring over the last several months, I am also concerned about a neurodegenerative disorder which includes multiple sclerosis and parkinsonian-like syndromes in the differential diagnosis. I will obtain a brain MRI.  2. Hypertension: Controlled. No changes to therapy.   Disposition: Follow up in 6 weeks  Signed: Prentice Docker, M.D., F.A.C.C.  01/04/2017, 9:14 AM

## 2017-01-10 ENCOUNTER — Ambulatory Visit (HOSPITAL_COMMUNITY)
Admission: RE | Admit: 2017-01-10 | Discharge: 2017-01-10 | Disposition: A | Discharge status: Home / Self Care | Payer: BLUE CROSS/BLUE SHIELD | Source: Ambulatory Visit | Attending: Cardiovascular Disease | Admitting: Cardiovascular Disease

## 2017-01-10 DIAGNOSIS — H539 Unspecified visual disturbance: Secondary | ICD-10-CM | POA: Diagnosis not present

## 2017-01-10 DIAGNOSIS — R930 Abnormal findings on diagnostic imaging of skull and head, not elsewhere classified: Secondary | ICD-10-CM | POA: Insufficient documentation

## 2017-01-10 DIAGNOSIS — R251 Tremor, unspecified: Secondary | ICD-10-CM

## 2017-01-10 DIAGNOSIS — R55 Syncope and collapse: Secondary | ICD-10-CM | POA: Diagnosis present

## 2017-01-11 ENCOUNTER — Telehealth: Payer: Self-pay | Admitting: *Deleted

## 2017-01-11 ENCOUNTER — Other Ambulatory Visit: Payer: Self-pay

## 2017-01-11 ENCOUNTER — Ambulatory Visit (INDEPENDENT_AMBULATORY_CARE_PROVIDER_SITE_OTHER): Payer: BLUE CROSS/BLUE SHIELD

## 2017-01-11 DIAGNOSIS — R55 Syncope and collapse: Secondary | ICD-10-CM | POA: Diagnosis not present

## 2017-01-11 NOTE — Telephone Encounter (Signed)
Notes recorded by Lesle Chris, LPN on 95/28/4132 at 9:27 AM EDT Follow up already scheduled for 02/09/2017 with Dr. Purvis Sheffield. ------  Notes recorded by Lesle Chris, LPN on 44/04/270 at 9:27 AM EDT Patient notified. Copy to pmd. ------  Notes recorded by Laqueta Linden, MD on 01/10/2017 at 11:09 AM EDT No significant abnormalities. There is some mention of a nonspecific finding which can cause headaches. Will defer to PCP on this.

## 2017-01-12 ENCOUNTER — Ambulatory Visit (INDEPENDENT_AMBULATORY_CARE_PROVIDER_SITE_OTHER): Payer: BLUE CROSS/BLUE SHIELD

## 2017-01-12 DIAGNOSIS — R55 Syncope and collapse: Secondary | ICD-10-CM

## 2017-01-17 ENCOUNTER — Telehealth: Payer: Self-pay | Admitting: *Deleted

## 2017-01-17 NOTE — Telephone Encounter (Signed)
Notes recorded by Lesle Chris, LPN on 31/51/7616 at 2:55 PM EDT Patient notified. Copy to pmd. Follow up 02/09/2017. Okay to stop monitor a little early per Dr. Purvis Sheffield as her 30 days would be up on the date of her follow up visit. She verbalized understanding. ------  Notes recorded by Laqueta Linden, MD on 01/11/2017 at 4:32 PM EDT Normal cardiac function. Mild valve leakage.

## 2017-02-09 ENCOUNTER — Ambulatory Visit: Payer: Self-pay | Admitting: Cardiovascular Disease

## 2017-03-05 ENCOUNTER — Telehealth: Payer: Self-pay | Admitting: *Deleted

## 2017-03-05 NOTE — Telephone Encounter (Signed)
Notes recorded by Lesle Chris, LPN on 16/12/6787 at 3:03 PM EST Patient notified. Copy to pmd. Follow up scheduled for 03/26/2017 with Dr. Purvis Sheffield.   ------  Notes recorded by Laqueta Linden, MD on 03/02/2017 at 2:18 PM EST Some occasional extra beats noted, but no significant abnormal rhythms.

## 2017-03-20 ENCOUNTER — Telehealth: Payer: Self-pay | Admitting: Rheumatology

## 2017-03-20 NOTE — Telephone Encounter (Signed)
Patient having increased swelling, and now increased pain with both hands. Worse x1 week now. Please call to advise. Patient uses [facility]. Please call to advise.

## 2017-03-20 NOTE — Progress Notes (Addendum)
Office Visit Note  Patient: Amanda Hammond             Date of Birth: 1970-08-13           MRN: [id number]             PCP: Deloria Lair., MD Referring: Deloria Lair., MD Visit Date: 03/21/2017 Occupation: _0 @    Subjective:  Bilateral hand pain and swelling   History of Present Illness: Amanda Hammond is a 46 y.o. female with a history of fibromyalgia, osteoarthritis, and trochanteric bursitis.  Patient states after the past 3 weeks she has had bilateral hand pain and swelling. She reports decreased grip strength. She is also having increased pedal edema.  She has been experiencing a fibromyalgia flare as well. She continues to take Cymbalta, Lyrica, Zanaflex, and Nucynta. Her right knee has been causing her significant pain for the past 2 days.  She states she has tried icing her knee and elevating it.  She would like a cortisone injection in her right knee.  She states she had gel injection in September and her last cortisone was months before that.  She continues to have discomfort of bilateral trochanteric bursa and SI joints.  She reports she is no longer taking Colchicine.     Activities of Daily Living:  Patient reports morning stiffness for 1 hour.   Patient Reports nocturnal pain.  Difficulty dressing/grooming: Denies Difficulty climbing stairs: Reports Difficulty getting out of chair: Reports Difficulty using hands for taps, buttons, cutlery, and/or writing: Reports   Review of Systems  Constitutional: Positive for fatigue. Negative for weakness.  HENT: Positive for mouth dryness. Negative for mouth sores and nose dryness.   Eyes: Positive for dryness. Negative for redness.  Respiratory: Negative for cough, hemoptysis, shortness of breath and difficulty breathing.   Cardiovascular: Negative.  Positive for swelling in legs/feet. Negative for chest pain, palpitations, hypertension and irregular heartbeat.  Gastrointestinal: Negative.  Negative for blood in  stool, constipation and diarrhea.  Endocrine: Negative for increased urination.  Genitourinary: Negative for painful urination.  Musculoskeletal: Positive for arthralgias, joint pain, joint swelling, morning stiffness and muscle tenderness. Negative for myalgias, muscle weakness and myalgias.  Skin: Negative for color change, pallor, rash, hair loss, nodules/bumps, redness, skin tightness, ulcers and sensitivity to sunlight.  Neurological: Negative for dizziness, numbness and headaches.  Hematological: Negative for swollen glands.  Psychiatric/Behavioral: Positive for depressed mood (On Wellbutrin and Cymbalta) and sleep disturbance. The patient is nervous/anxious.     PMFS History:  Patient Active Problem List   Diagnosis Date Noted  . Rheumatoid factor positive 11/06/2016  . Trochanteric bursitis of both hips 11/06/2016  . History of fibromyalgia 11/06/2016  . DDD (degenerative disc disease), cervical 11/06/2016  . Carpal tunnel syndrome, left upper limb 11/06/2016  . Carpal tunnel syndrome, right upper limb 11/06/2016  . Trigger finger, left middle finger 11/06/2016  . Major depressive disorder, recurrent episode, moderate ([place]) 04/27/2016  . Pain in left hand 04/25/2016  . Pain in right hand 04/25/2016  . Primary osteoarthritis of both hands 04/25/2016  . Primary osteoarthritis of both knees 04/25/2016  . Primary osteoarthritis of both feet 04/25/2016  . Fibromyalgia 02/17/2016  . Other insomnia 02/17/2016  . Fatigue 02/17/2016  . B12 DEFICIENCY 02/01/2010  . Vitamin D deficiency 02/01/2010  . ANEMIA, IRON DEFICIENCY 12/30/2009  . OTHER DYSPNEA AND RESPIRATORY ABNORMALITIES 12/30/2009  . Nonspecific (abnormal) findings on radiological and other examination of body structure 12/30/2009  .  COMPUTERIZED TOMOGRAPHY, CHEST, ABNORMAL 12/30/2009    Past Medical History:  Diagnosis Date  . Anemia   . Fibromyositis   . Hypertension   . Osteoarthritis   . PONV (postoperative  nausea and vomiting)   . Vitamin D deficiency     Family History  Problem Relation Age of Onset  . Diabetes Mother   . Hypertension Mother   . Hypertension Father   . Anesthesia problems Neg Hx   . Hypotension Neg Hx   . Pseudochol deficiency Neg Hx   . Malignant hyperthermia Neg Hx    Past Surgical History:  Procedure Laterality Date  . APPENDECTOMY  2010   [facility]  . CARPAL TUNNEL RELEASE Left   . CHOLECYSTECTOMY  1999   lap, [facility]  . GASTRIC BYPASS  2003   [facility]  . HERNIA REPAIR  08/2010, 02/2010    incisional, [facility], [place]  . INCISIONAL HERNIA REPAIR  10/12/2010   Procedure: HERNIA REPAIR INCISIONAL;  Surgeon: Jamesetta So;  Location: [facility];  Service: General;  Laterality: N/A;  Recurrent Incisional Hernia Repair with Mesh  . PANNICULECTOMY  2008   [facility]  . TENNIS ELBOW RELEASE/NIRSCHEL PROCEDURE Bilateral    Social History   Social History Narrative  . Not on file     Objective: Vital Signs: BP 122/86 (BP Location: Left Arm, Patient Position: Sitting, Cuff Size: Normal)   Pulse 88   Ht _0  (1.676 m)   Wt 262 lb (118.8 kg)   BMI 42.29 kg/m    Physical Exam  Constitutional: She is oriented to person, place, and time. She appears well-developed and well-nourished.  HENT:  Head: Normocephalic and atraumatic.  Eyes: Conjunctivae and EOM are normal.  Neck: Normal range of motion.  Cardiovascular: Normal rate, regular rhythm, normal heart sounds and intact distal pulses.  Pulmonary/Chest: Effort normal and breath sounds normal.  Abdominal: Soft. Bowel sounds are normal.  Lymphadenopathy:    She has no cervical adenopathy.  Neurological: She is alert and oriented to person, place, and time.  Skin: Skin is warm and dry. Capillary refill takes less than 2 seconds.  Psychiatric: She has a normal mood and affect. Her behavior is normal.  Nursing note and vitals reviewed.    Musculoskeletal Exam: C-spine limited ROM with discomfort.  Tenderness of SI joints.  Good ROM  of thoracic and lumbar.  Bilateral trochanteric bursa tenderness.  Right hip limited extension and crepitus.  MCPs, PIPs, and DIPs good ROM with no synovitis.  PIP and DIP synovial thickening consistent with osteoarthritis.  Hip joints good ROM.  Pedal edema present bilaterally.  MTPs, PIPs, and DIPs good ROM with no synovitis.    CDAI Exam: No CDAI exam completed.    Investigation: No additional findings.  March 2017 labs: CCP negative, ANA negative, ACE negative, 14-3-3 eta negative, G6PD negative, uric acid 7.1, HLAB27 negative, SPEP normal, immunoglobulins normal, TB normal. Hep panel negative April 2017 ultrasound negative for synovitis of bilateral hands  Median nerve increased width bilaterally  Imaging: Xr Knee 3 View Right  Result Date: 03/21/2017 Moderate medial compartment narrowing.  Moderate patellofemoral joint space narrowing.  Impression: Findings consistent with moderate osteoarthritis and moderate chondromalacia patella   Speciality Comments: No specialty comments available.    Procedures:  Large Joint Inj: R knee on 03/21/2017 9:42 AM Indications: pain Details: 27 G 1.5 in needle, medial approach  Arthrogram: No  Medications: 1.5 mL lidocaine 1 %; 40 mg triamcinolone acetonide 40 MG/ML Aspirate: 0 mL  Outcome: tolerated well, no immediate complications Procedure, treatment alternatives, risks and benefits explained, specific risks discussed. Immediately prior to procedure a time out was called to verify the correct patient, procedure, equipment, support staff and site/side marked as required.     Allergies: Patient has no known allergies.   Assessment / Plan:     Visit Diagnoses: Fibromyalgia: Patient has generalized hyperalgesia on exam. She has been having a fibromyalgia flare for the past 3 weeks.  She continues to take Cymbalta, Lyrica, and Zanaflex.   Other insomnia: Worsening insomnia due to pain.  Discussed good sleep hygiene.    Other fatigue:  Chronic.  Discussed the importance of exercise.    Primary osteoarthritis of both hands:  Mild PIP and DIP synovial thickening consistent with osteoarthritis.  No synovitis.  Discussed joint protection and muscle strengthening.    Primary osteoarthritis of both knees: Patient is having significant pain in her right knee.  She has an effusion and warmth on exam.    Chronic pain of right knee -She warmth and swelling on exam.  She has slightly limited flexion and extension.  A x-ray was performed. Which showed moderate osteoarthritis and moderate condom malacia patella. She's been having dark discomfort and also has some edema on her right lower extremity. It raises the concern if she had possible Baker's cyst rupture. The risks and benefits of a cortisone injection were discussed.  Cortisone injection performed in the office today.  She can continue using Voltaren on her joints.  Labs were obtained today including: RF, CCP, ESR, uric acid, 14-3-3-eta, CBC,and CMP.  Patient has a history of a positive rheumatoid factor.  Previous uric acid was 7.1.  Plan: XR KNEE 3 VIEW RIGHT . Due to inflammatory arthritis in her knee joint I will obtain following labs to look for any underlying inflammatory process. She has had positive rheumatoid factor in the past. I've advised her to contact me in case her knee joint symptoms persist.  Primary osteoarthritis of both feet: Continues to have discomfort.  Discussed proper fitting shoes.    DDD (degenerative disc disease), cervical: continues to have stiffness and discomfort.   Trochanteric bursitis of both hips: Bilateral tenderness of trochanteric bursa.  Declined cortisone injection since her right knee pain is more severe at this time.  Discussed performing exercises at home.      Other medical conditions are listed as follows:   History of vitamin D deficiency  History of depression  History of obesity     Orders: Orders Placed This Encounter    Procedures  . Large Joint Inj: R knee  . XR KNEE 3 VIEW RIGHT  . Uric acid  . CBC with Differential/Platelet  . COMPLETE METABOLIC PANEL WITH GFR  . Sedimentation rate  . Cyclic citrul peptide antibody, IgG  . Rheumatoid factor  . 14-3-3 eta Protein   No orders of the defined types were placed in this encounter.   Face-to-face time spent with patient was 30 minutes. Greater than 50% of time was spent in counseling and coordination of care.  Follow-Up Instructions: Return for Osteoarthritis, Fibromyalgia. Will schedule follow-up appointment as soon as possible.  Bo Merino, MD Note - This record has been created using Editor, commissioning.  Chart creation errors have been sought, but may not always  have been located. Such creation errors do not reflect on  the standard of medical care.

## 2017-03-20 NOTE — Telephone Encounter (Signed)
Patient has been scheduled for 03/21/17 at 9:15 am.

## 2017-03-20 NOTE — Telephone Encounter (Signed)
Patient states she is having pain in bilateral hands. Patient states is having pain and swelling in right calf, ankle and foot as well. Patient denies an injury. Patient states the pain and swelling states in the last month her pain has gotten worse and the swelling started. Patient states she noticed the swelling and pain last week. We treat patient for Fibromyalgia. Please advise.

## 2017-03-20 NOTE — Telephone Encounter (Signed)
We have not seen this patient since January 2018. If there are any cancellations or open appointments you may schedule an appointment. Otherwise give her next available appointment. If she cannot wait till follow-up visit then she should see her PCP.

## 2017-03-21 ENCOUNTER — Encounter: Payer: Self-pay | Admitting: Rheumatology

## 2017-03-21 ENCOUNTER — Ambulatory Visit (INDEPENDENT_AMBULATORY_CARE_PROVIDER_SITE_OTHER): Payer: Self-pay

## 2017-03-21 ENCOUNTER — Ambulatory Visit (INDEPENDENT_AMBULATORY_CARE_PROVIDER_SITE_OTHER): Payer: BLUE CROSS/BLUE SHIELD | Admitting: Rheumatology

## 2017-03-21 VITALS — BP 122/86 | HR 88 | Ht 66.0 in | Wt 262.0 lb

## 2017-03-21 DIAGNOSIS — Z8659 Personal history of other mental and behavioral disorders: Secondary | ICD-10-CM | POA: Diagnosis not present

## 2017-03-21 DIAGNOSIS — M797 Fibromyalgia: Secondary | ICD-10-CM | POA: Diagnosis not present

## 2017-03-21 DIAGNOSIS — M170 Bilateral primary osteoarthritis of knee: Secondary | ICD-10-CM

## 2017-03-21 DIAGNOSIS — M19042 Primary osteoarthritis, left hand: Secondary | ICD-10-CM

## 2017-03-21 DIAGNOSIS — G4709 Other insomnia: Secondary | ICD-10-CM

## 2017-03-21 DIAGNOSIS — M7061 Trochanteric bursitis, right hip: Secondary | ICD-10-CM | POA: Diagnosis not present

## 2017-03-21 DIAGNOSIS — M7062 Trochanteric bursitis, left hip: Secondary | ICD-10-CM

## 2017-03-21 DIAGNOSIS — G8929 Other chronic pain: Secondary | ICD-10-CM

## 2017-03-21 DIAGNOSIS — M19041 Primary osteoarthritis, right hand: Secondary | ICD-10-CM | POA: Diagnosis not present

## 2017-03-21 DIAGNOSIS — R5383 Other fatigue: Secondary | ICD-10-CM

## 2017-03-21 DIAGNOSIS — M19072 Primary osteoarthritis, left ankle and foot: Secondary | ICD-10-CM

## 2017-03-21 DIAGNOSIS — Z8639 Personal history of other endocrine, nutritional and metabolic disease: Secondary | ICD-10-CM | POA: Diagnosis not present

## 2017-03-21 DIAGNOSIS — M19071 Primary osteoarthritis, right ankle and foot: Secondary | ICD-10-CM | POA: Diagnosis not present

## 2017-03-21 DIAGNOSIS — M5030 Other cervical disc degeneration, unspecified cervical region: Secondary | ICD-10-CM

## 2017-03-21 DIAGNOSIS — M25561 Pain in right knee: Secondary | ICD-10-CM | POA: Diagnosis not present

## 2017-03-21 MED ORDER — LIDOCAINE HCL 1 % IJ SOLN
1.5000 mL | INTRAMUSCULAR | Status: AC | PRN
  Administered 2017-03-21: 1.5 mL

## 2017-03-21 MED ORDER — TRIAMCINOLONE ACETONIDE 40 MG/ML IJ SUSP
40.0000 mg | INTRAMUSCULAR | Status: AC | PRN
  Administered 2017-03-21: 40 mg via INTRA_ARTICULAR

## 2017-03-21 NOTE — Patient Instructions (Signed)

## 2017-03-23 NOTE — Progress Notes (Deleted)
Office Visit Note  Patient: Amanda Hammond             Date of Birth: Apr 26, 1970           MRN: [id number]             PCP: Louie Boston., MD Referring: Louie Boston., MD Visit Date: 04/06/2017 Occupation: @GUAROCC @    Subjective:  No chief complaint on file.   History of Present Illness: Amanda Hammond is a 46 y.o. female ***   Activities of Daily Living:  Patient reports morning stiffness for *** {minute/hour:19697}.   Patient {ACTIONS;DENIES/REPORTS:21021675::"Denies"} nocturnal pain.  Difficulty dressing/grooming: {ACTIONS;DENIES/REPORTS:21021675::"Denies"} Difficulty climbing stairs: {ACTIONS;DENIES/REPORTS:21021675::"Denies"} Difficulty getting out of chair: {ACTIONS;DENIES/REPORTS:21021675::"Denies"} Difficulty using hands for taps, buttons, cutlery, and/or writing: {ACTIONS;DENIES/REPORTS:21021675::"Denies"}   No Rheumatology ROS completed.   PMFS History:  Patient Active Problem List   Diagnosis Date Noted  . Rheumatoid factor positive 11/06/2016  . Trochanteric bursitis of both hips 11/06/2016  . History of fibromyalgia 11/06/2016  . DDD (degenerative disc disease), cervical 11/06/2016  . Carpal tunnel syndrome, left upper limb 11/06/2016  . Carpal tunnel syndrome, right upper limb 11/06/2016  . Trigger finger, left middle finger 11/06/2016  . Major depressive disorder, recurrent episode, moderate ([facility]) 04/27/2016  . Pain in left hand 04/25/2016  . Pain in right hand 04/25/2016  . Primary osteoarthritis of both hands 04/25/2016  . Primary osteoarthritis of both knees 04/25/2016  . Primary osteoarthritis of both feet 04/25/2016  . Fibromyalgia 02/17/2016  . Other insomnia 02/17/2016  . Fatigue 02/17/2016  . B12 DEFICIENCY 02/01/2010  . Vitamin D deficiency 02/01/2010  . ANEMIA, IRON DEFICIENCY 12/30/2009  . OTHER DYSPNEA AND RESPIRATORY ABNORMALITIES 12/30/2009  . Nonspecific (abnormal) findings on radiological and other examination of body  structure 12/30/2009  . COMPUTERIZED TOMOGRAPHY, CHEST, ABNORMAL 12/30/2009    Past Medical History:  Diagnosis Date  . Anemia   . Fibromyositis   . Hypertension   . Osteoarthritis   . PONV (postoperative nausea and vomiting)   . Vitamin D deficiency     Family History  Problem Relation Age of Onset  . Diabetes Mother   . Hypertension Mother   . Hypertension Father   . Anesthesia problems Neg Hx   . Hypotension Neg Hx   . Pseudochol deficiency Neg Hx   . Malignant hyperthermia Neg Hx    Past Surgical History:  Procedure Laterality Date  . APPENDECTOMY  2010   [facility]  . CARPAL TUNNEL RELEASE Left   . CHOLECYSTECTOMY  1999   lap, [facility]  . GASTRIC BYPASS  2003   [facility]  . HERNIA REPAIR  08/2010, 02/2010    incisional, [facility], [facility]  . INCISIONAL HERNIA REPAIR  10/12/2010   Procedure: HERNIA REPAIR INCISIONAL;  Surgeon: 12/13/2010;  Location: [facility];  Service: General;  Laterality: N/A;  Recurrent Incisional Hernia Repair with Mesh  . PANNICULECTOMY  2008   [facility]  . TENNIS ELBOW RELEASE/NIRSCHEL PROCEDURE Bilateral    Social History   Social History Narrative  . Not on file     Objective: Vital Signs: There were no vitals taken for this visit.   Physical Exam   Musculoskeletal Exam: ***  CDAI Exam: No CDAI exam completed.    Investigation: No additional findings. CBC Latest Ref Rng & Units 03/21/2017 10/14/2010 10/13/2010  WBC 3.8 - 10.8 Thousand/uL 8.3 9.2 12.5(H)  Hemoglobin 11.7 - 15.5 g/dL 10.9(L) 10.0(L) 10.6(L)  Hematocrit 35.0 - 45.0 % 34.4(L) 32.3(L)  32.9(L)  Platelets 140 - 400 Thousand/uL 328 279 310   CMP Latest Ref Rng & Units 03/21/2017 10/14/2010 10/13/2010  Glucose 65 - 99 mg/dL 790(W) 98 409(B)  BUN 7 - 25 mg/dL 6(L) 7 10  Creatinine 3.53 - 1.10 mg/dL 2.99 2.42(A) 8.34  Sodium 135 - 146 mmol/L 139 131(L) 134(L)  Potassium 3.5 - 5.3 mmol/L 3.7 3.3(L) 4.2  Chloride 98 - 110 mmol/L 103 92(L) 96  CO2 20 - 32 mmol/L 29 31 31   Calcium 8.6 - 10.2  mg/dL 9.0 8.5 8.8  Total Protein 6.1 - 8.1 g/dL 6.4 - -  Total Bilirubin 0.2 - 1.2 mg/dL 0.4 - -  Alkaline Phos 39 - 117 U/L - - -  AST 10 - 35 U/L 11 - -  ALT 6 - 29 U/L 9 - -    Imaging: Xr Knee 3 View Right  Result Date: 03/21/2017 Moderate medial compartment narrowing.  Moderate patellofemoral joint space narrowing.  Impression: Findings consistent with moderate osteoarthritis and moderate chondromalacia patella   Speciality Comments: No specialty comments available.    Procedures:  No procedures performed Allergies: Patient has no known allergies.   Assessment / Plan:     Visit Diagnoses: No diagnosis found.    Orders: No orders of the defined types were placed in this encounter.  No orders of the defined types were placed in this encounter.   Face-to-face time spent with patient was *** minutes. 50% of time was spent in counseling and coordination of care.  Follow-Up Instructions: No Follow-up on file.   03/23/2017, CMA  Note - This record has been created using Ellen Henri.  Chart creation errors have been sought, but may not always  have been located. Such creation errors do not reflect on  the standard of medical care.

## 2017-03-24 LAB — COMPLETE METABOLIC PANEL WITH GFR
AG Ratio: 1.4 (calc) (ref 1.0–2.5)
ALBUMIN MSPROF: 3.7 g/dL (ref 3.6–5.1)
ALKALINE PHOSPHATASE (APISO): 103 U/L (ref 33–115)
ALT: 9 U/L (ref 6–29)
AST: 11 U/L (ref 10–35)
BUN / CREAT RATIO: 9 (calc) (ref 6–22)
BUN: 6 mg/dL — AB (ref 7–25)
CALCIUM: 9 mg/dL (ref 8.6–10.2)
CO2: 29 mmol/L (ref 20–32)
CREATININE: 0.67 mg/dL (ref 0.50–1.10)
Chloride: 103 mmol/L (ref 98–110)
GFR, EST AFRICAN AMERICAN: 123 mL/min/{1.73_m2} (ref >=60)
GFR, EST NON AFRICAN AMERICAN: 106 mL/min/{1.73_m2} (ref >=60)
GLOBULIN: 2.7 g/dL (ref 1.9–3.7)
Glucose, Bld: 112 mg/dL — ABNORMAL HIGH (ref 65–99)
Potassium: 3.7 mmol/L (ref 3.5–5.3)
SODIUM: 139 mmol/L (ref 135–146)
TOTAL PROTEIN: 6.4 g/dL (ref 6.1–8.1)
Total Bilirubin: 0.4 mg/dL (ref 0.2–1.2)

## 2017-03-24 LAB — CBC WITH DIFFERENTIAL/PLATELET
BASOS ABS: 8 {cells}/uL (ref 0–200)
BASOS PCT: 0.1 %
EOS PCT: 0.1 %
Eosinophils Absolute: 8 cells/uL — ABNORMAL LOW (ref 15–500)
HEMATOCRIT: 34.4 % — AB (ref 35.0–45.0)
HEMOGLOBIN: 10.9 g/dL — AB (ref 11.7–15.5)
LYMPHS ABS: 2183 {cells}/uL (ref 850–3900)
MCH: 24.3 pg — ABNORMAL LOW (ref 27.0–33.0)
MCHC: 31.7 g/dL — AB (ref 32.0–36.0)
MCV: 76.6 fL — ABNORMAL LOW (ref 80.0–100.0)
MPV: 9.8 fL (ref 7.5–12.5)
Monocytes Relative: 5.8 %
NEUTROS ABS: 5619 {cells}/uL (ref 1500–7800)
Neutrophils Relative %: 67.7 %
Platelets: 328 10*3/uL (ref 140–400)
RBC: 4.49 10*6/uL (ref 3.80–5.10)
RDW: 14.8 % (ref 11.0–15.0)
Total Lymphocyte: 26.3 %
WBC mixed population: 481 cells/uL (ref 200–950)
WBC: 8.3 10*3/uL (ref 3.8–10.8)

## 2017-03-24 LAB — 14-3-3 ETA PROTEIN: 14-3-3 eta Protein: <0.2 ng/mL (ref <0.2)

## 2017-03-24 LAB — URIC ACID: URIC ACID, SERUM: 6.8 mg/dL (ref 2.5–7.0)

## 2017-03-24 LAB — CYCLIC CITRUL PEPTIDE ANTIBODY, IGG: Cyclic Citrullin Peptide Ab: <16 UNITS

## 2017-03-24 LAB — RHEUMATOID FACTOR: Rhuematoid fact SerPl-aCnc: <14 IU/mL (ref <14)

## 2017-03-24 LAB — SEDIMENTATION RATE: Sed Rate: 41 mm/h — ABNORMAL HIGH (ref 0–20)

## 2017-03-26 ENCOUNTER — Ambulatory Visit: Payer: Self-pay | Admitting: Cardiovascular Disease

## 2017-03-28 NOTE — Progress Notes (Signed)
Sed rate elevated at 41.  We will recheck the ESR in 1 month.    All other labs are stable.

## 2017-03-29 ENCOUNTER — Other Ambulatory Visit: Payer: Self-pay

## 2017-03-29 DIAGNOSIS — R5383 Other fatigue: Secondary | ICD-10-CM

## 2017-03-29 DIAGNOSIS — M170 Bilateral primary osteoarthritis of knee: Secondary | ICD-10-CM

## 2017-03-29 DIAGNOSIS — M797 Fibromyalgia: Secondary | ICD-10-CM

## 2017-04-06 ENCOUNTER — Ambulatory Visit: Payer: BLUE CROSS/BLUE SHIELD | Admitting: Rheumatology

## 2017-05-30 ENCOUNTER — Other Ambulatory Visit (HOSPITAL_COMMUNITY): Payer: Self-pay | Admitting: Unknown Physician Specialty

## 2017-05-30 DIAGNOSIS — Z1231 Encounter for screening mammogram for malignant neoplasm of breast: Secondary | ICD-10-CM

## 2017-06-06 ENCOUNTER — Ambulatory Visit (HOSPITAL_COMMUNITY): Payer: Self-pay

## 2017-06-25 NOTE — Progress Notes (Signed)
Office Visit Note  Patient: Amanda Hammond             Date of Birth: 05/08/1970           MRN: [id number]             PCP: Louie Boston., MD Referring: Louie Boston., MD Visit Date: 06/26/2017 Occupation: @GUAROCC @    Subjective:  Other (flare )   History of Present Illness: Amanda Hammond is a 47 y.o. female with history of fibromyalgia osteoarthritis and degenerative disc disease.  She states she is having a flare with increased pain and discomfort all over.  She describes pain and discomfort around her C-spine.  She also has discomfort in her right knee joint.  She had cortisone injection in the past which was helpful.  She has been also having discomfort in her left second MCP joint.  Her left middle trigger finger has resolved.  Activities of Daily Living:  Patient reports morning stiffness for 1 hour.   Patient Reports nocturnal pain.  Difficulty dressing/grooming: Denies Difficulty climbing stairs: Reports Difficulty getting out of chair: Reports Difficulty using hands for taps, buttons, cutlery, and/or writing: Reports   Review of Systems  Constitutional: Positive for fatigue. Negative for night sweats, weight gain and weight loss.  HENT: Positive for mouth dryness. Negative for mouth sores, trouble swallowing, trouble swallowing and nose dryness.   Eyes: Negative for pain, redness, visual disturbance and dryness.  Respiratory: Negative for cough, shortness of breath and difficulty breathing.   Cardiovascular: Negative for chest pain, palpitations, hypertension, irregular heartbeat and swelling in legs/feet.  Gastrointestinal: Negative for blood in stool, constipation and diarrhea.  Endocrine: Negative for increased urination.  Genitourinary: Negative for vaginal dryness.  Musculoskeletal: Positive for arthralgias, joint pain, myalgias, morning stiffness and myalgias. Negative for joint swelling, muscle weakness and muscle tenderness.  Skin: Negative for color  change, rash, hair loss, skin tightness, ulcers and sensitivity to sunlight.  Allergic/Immunologic: Negative for susceptible to infections.  Neurological: Negative for dizziness, memory loss, night sweats and weakness.  Hematological: Negative for swollen glands.  Psychiatric/Behavioral: Positive for sleep disturbance. Negative for depressed mood. The patient is not nervous/anxious.     PMFS History:  Patient Active Problem List   Diagnosis Date Noted  . Rheumatoid factor positive 11/06/2016  . Trochanteric bursitis of both hips 11/06/2016  . History of fibromyalgia 11/06/2016  . DDD (degenerative disc disease), cervical 11/06/2016  . Carpal tunnel syndrome, left upper limb 11/06/2016  . Carpal tunnel syndrome, right upper limb 11/06/2016  . Trigger finger, left middle finger 11/06/2016  . Major depressive disorder, recurrent episode, moderate (HCC) 04/27/2016  . Pain in left hand 04/25/2016  . Pain in right hand 04/25/2016  . Primary osteoarthritis of both hands 04/25/2016  . Primary osteoarthritis of both knees 04/25/2016  . Primary osteoarthritis of both feet 04/25/2016  . Fibromyalgia 02/17/2016  . Other insomnia 02/17/2016  . Fatigue 02/17/2016  . B12 DEFICIENCY 02/01/2010  . Vitamin D deficiency 02/01/2010  . ANEMIA, IRON DEFICIENCY 12/30/2009  . OTHER DYSPNEA AND RESPIRATORY ABNORMALITIES 12/30/2009  . Nonspecific (abnormal) findings on radiological and other examination of body structure 12/30/2009  . COMPUTERIZED TOMOGRAPHY, CHEST, ABNORMAL 12/30/2009    Past Medical History:  Diagnosis Date  . Anemia   . Fibromyositis   . Hypertension   . Osteoarthritis   . PONV (postoperative nausea and vomiting)   . Vitamin D deficiency     Family History  Problem Relation  Age of Onset  . Diabetes Mother   . Hypertension Mother   . Hypertension Father   . Endometriosis Daughter   . Anesthesia problems Neg Hx   . Hypotension Neg Hx   . Pseudochol deficiency Neg Hx   .  Malignant hyperthermia Neg Hx    Past Surgical History:  Procedure Laterality Date  . APPENDECTOMY  2010   [facility]  . CARPAL TUNNEL RELEASE Left   . CHOLECYSTECTOMY  1999   lap, [facility]  . GASTRIC BYPASS  2003   [facility]  . HERNIA REPAIR  08/2010, 02/2010    incisional, [facility], [facility]  . INCISIONAL HERNIA REPAIR  10/12/2010   Procedure: HERNIA REPAIR INCISIONAL;  Surgeon: Dalia Heading;  Location: [facility];  Service: General;  Laterality: N/A;  Recurrent Incisional Hernia Repair with Mesh  . PANNICULECTOMY  2008   [facility]  . TENNIS ELBOW RELEASE/NIRSCHEL PROCEDURE Bilateral    Social History   Social History Narrative  . Not on file     Objective: Vital Signs: BP 99/76 (BP Location: Left Arm, Patient Position: Sitting, Cuff Size: Large)   Pulse 80   Resp 18   Ht 5\' 6"  (1.676 m)   Wt 262 lb (118.8 kg)   BMI 42.29 kg/m    Physical Exam  Constitutional: She is oriented to person, place, and time. She appears well-developed and well-nourished.  HENT:  Head: Normocephalic and atraumatic.  Eyes: Conjunctivae and EOM are normal.  Neck: Normal range of motion.  Cardiovascular: Normal rate, regular rhythm, normal heart sounds and intact distal pulses.  Pulmonary/Chest: Effort normal and breath sounds normal.  Abdominal: Soft. Bowel sounds are normal.  Lymphadenopathy:    She has no cervical adenopathy.  Neurological: She is alert and oriented to person, place, and time.  Skin: Skin is warm and dry. Capillary refill takes less than 2 seconds.  Psychiatric: She has a normal mood and affect. Her behavior is normal.  Nursing note and vitals reviewed.    Musculoskeletal Exam: C-spine thoracic lumbar spine limited range of motion with discomfort.  Shoulder joints elbow joints wrist joint MCPs PIPs DIPs were in good range of motion with no synovitis.  She has some DIP PIP thickening in her hands and feet consistent with osteoarthritis.  She has discomfort range of motion of her right knee joint  without any warmth swelling or effusion.  All other joints have full range of motion.  She had generalized hyperalgesia and positive tender points.  CDAI Exam: No CDAI exam completed.    Investigation: No additional findings. CBC Latest Ref Rng & Units 03/21/2017 10/14/2010 10/13/2010  WBC 3.8 - 10.8 Thousand/uL 8.3 9.2 12.5(H)  Hemoglobin 11.7 - 15.5 g/dL 10.9(L) 10.0(L) 10.6(L)  Hematocrit 35.0 - 45.0 % 34.4(L) 32.3(L) 32.9(L)  Platelets 140 - 400 Thousand/uL 328 279 310   CMP Latest Ref Rng & Units 03/21/2017 10/14/2010 10/13/2010  Glucose 65 - 99 mg/dL 12/14/2010) 98 694(W)  BUN 7 - 25 mg/dL 6(L) 7 10  Creatinine 546(E - 1.10 mg/dL 7.03 5.00) 9.38(H  Sodium 135 - 146 mmol/L 139 131(L) 134(L)  Potassium 3.5 - 5.3 mmol/L 3.7 3.3(L) 4.2  Chloride 98 - 110 mmol/L 103 92(L) 96  CO2 20 - 32 mmol/L 29 31 31   Calcium 8.6 - 10.2 mg/dL 9.0 8.5 8.8  Total Protein 6.1 - 8.1 g/dL 6.4 - -  Total Bilirubin 0.2 - 1.2 mg/dL 0.4 - -  Alkaline Phos 39 - 117 U/L - - -  AST  10 - 35 U/L 11 - -  ALT 6 - 29 U/L 9 - -    Imaging: No results found.  Speciality Comments: No specialty comments available.    Procedures:  Trigger Point Inj Date/Time: 06/26/2017 4:16 PM Performed by: Pollyann Savoy, MD Authorized by: Pollyann Savoy, MD   Consent Given by:  Patient Site marked: the procedure site was marked   Timeout: prior to procedure the correct patient, procedure, and site was verified   Indications:  Muscle spasm and pain Total # of Trigger Points:  2 Location: neck   Needle Size:  27 G Approach:  Dorsal Medications #1:  0.5 mL lidocaine 1 %; 10 mg triamcinolone acetonide 40 MG/ML Medications #2:  0.5 mL lidocaine 1 %; 10 mg triamcinolone acetonide 40 MG/ML Patient tolerance:  Patient tolerated the procedure well with no immediate complications Large Joint Inj: R knee on 06/26/2017 4:18 PM Indications: pain Details: 27 G 1.5 in needle, medial approach  Arthrogram: No  Medications: 1.5  mL lidocaine (PF) 1 %; 40 mg triamcinolone acetonide 40 MG/ML Aspirate: 0 mL Outcome: tolerated well, no immediate complications Procedure, treatment alternatives, risks and benefits explained, specific risks discussed. Consent was given by the patient. Immediately prior to procedure a time out was called to verify the correct patient, procedure, equipment, support staff and site/side marked as required. Patient was prepped and draped in the usual sterile fashion.     Allergies: Patient has no known allergies.   Assessment / Plan:     Visit Diagnoses: Fibromyalgia - On Cymbalta, Lyrica, Nucynta and Zanaflex.  She continues to have a lot of pain and discomfort.  Other insomnia: Good sleep hygiene was discussed.  Other fatigue: She has fatigue secondary to chronic insomnia and fibromyalgia.  Neck pain: She had bilateral trapezius spasm with a lot of discomfort.  After informed consent was obtained bilateral trapezius area were injected with cortisone as described above.  DDD (degenerative disc disease), cervical: Chronic pain  Primary osteoarthritis of both hands: She has osteoarthritis involving bilateral hands which causes pain and stiffness.  She has been having increased pain recently.  Joint muscle strengthening and joint protection was discussed.  Chronic pain of right knee: She has moderate osteoarthritis and moderate chondromalacia patella.  Her last injection was December 2018.  Per her request I will inject her knee joint with cortisone again today.  Weight loss diet and exercise was discussed.  A handout on knee joint exercises was given.  Patient is interested in getting Euflexxa injections.  We will apply for it.  Primary osteoarthritis of both knees: Chronic pain  Primary osteoarthritis of both feet: Proper fitting shoes were discussed.  Trochanteric bursitis of both hips: Chronic pain  Carpal tunnel syndrome, right upper limb: She continues to have some  discomfort.  Vitamin D deficiency: She reports taking supplements.  Major depressive disorder, recurrent episode, moderate (HCC): She is on Cymbalta.   Orders: Orders Placed This Encounter  Procedures  . Trigger Point Inj  . Trigger Point Inj  . Large Joint Inj   No orders of the defined types were placed in this encounter.   Face-to-face time spent with patient was 30 minutes.  Greater than 50% of time was spent in counseling and coordination of care.  Follow-Up Instructions: Return in about 6 months (around 12/27/2017) for FMS, OA, DDD.   Pollyann Savoy, MD  Note - This record has been created using Animal nutritionist.  Chart creation errors have been sought, but may  not always  have been located. Such creation errors do not reflect on  the standard of medical care.

## 2017-06-26 ENCOUNTER — Ambulatory Visit: Payer: BLUE CROSS/BLUE SHIELD | Admitting: Rheumatology

## 2017-06-26 ENCOUNTER — Encounter: Payer: Self-pay | Admitting: Physician Assistant

## 2017-06-26 VITALS — BP 99/76 | HR 80 | Resp 18 | Ht 66.0 in | Wt 262.0 lb

## 2017-06-26 DIAGNOSIS — M7062 Trochanteric bursitis, left hip: Secondary | ICD-10-CM

## 2017-06-26 DIAGNOSIS — M797 Fibromyalgia: Secondary | ICD-10-CM | POA: Diagnosis not present

## 2017-06-26 DIAGNOSIS — M19071 Primary osteoarthritis, right ankle and foot: Secondary | ICD-10-CM

## 2017-06-26 DIAGNOSIS — M5030 Other cervical disc degeneration, unspecified cervical region: Secondary | ICD-10-CM | POA: Diagnosis not present

## 2017-06-26 DIAGNOSIS — M25561 Pain in right knee: Secondary | ICD-10-CM | POA: Diagnosis not present

## 2017-06-26 DIAGNOSIS — M19042 Primary osteoarthritis, left hand: Secondary | ICD-10-CM

## 2017-06-26 DIAGNOSIS — M19072 Primary osteoarthritis, left ankle and foot: Secondary | ICD-10-CM

## 2017-06-26 DIAGNOSIS — M19041 Primary osteoarthritis, right hand: Secondary | ICD-10-CM | POA: Diagnosis not present

## 2017-06-26 DIAGNOSIS — M542 Cervicalgia: Secondary | ICD-10-CM

## 2017-06-26 DIAGNOSIS — G8929 Other chronic pain: Secondary | ICD-10-CM

## 2017-06-26 DIAGNOSIS — F331 Major depressive disorder, recurrent, moderate: Secondary | ICD-10-CM

## 2017-06-26 DIAGNOSIS — M7061 Trochanteric bursitis, right hip: Secondary | ICD-10-CM | POA: Diagnosis not present

## 2017-06-26 DIAGNOSIS — R5383 Other fatigue: Secondary | ICD-10-CM

## 2017-06-26 DIAGNOSIS — E559 Vitamin D deficiency, unspecified: Secondary | ICD-10-CM | POA: Diagnosis not present

## 2017-06-26 DIAGNOSIS — M170 Bilateral primary osteoarthritis of knee: Secondary | ICD-10-CM | POA: Diagnosis not present

## 2017-06-26 DIAGNOSIS — G4709 Other insomnia: Secondary | ICD-10-CM | POA: Diagnosis not present

## 2017-06-26 DIAGNOSIS — G5601 Carpal tunnel syndrome, right upper limb: Secondary | ICD-10-CM

## 2017-06-26 MED ORDER — LIDOCAINE HCL 1 % IJ SOLN
0.5000 mL | INTRAMUSCULAR | Status: AC | PRN
  Administered 2017-06-26: .5 mL

## 2017-06-26 MED ORDER — TRIAMCINOLONE ACETONIDE 40 MG/ML IJ SUSP
10.0000 mg | INTRAMUSCULAR | Status: AC | PRN
  Administered 2017-06-26: 10 mg via INTRAMUSCULAR

## 2017-06-26 MED ORDER — LIDOCAINE HCL (PF) 1 % IJ SOLN
1.5000 mL | INTRAMUSCULAR | Status: AC | PRN
  Administered 2017-06-26: 1.5 mL

## 2017-06-26 MED ORDER — TRIAMCINOLONE ACETONIDE 40 MG/ML IJ SUSP
40.0000 mg | INTRAMUSCULAR | Status: AC | PRN
  Administered 2017-06-26: 40 mg via INTRA_ARTICULAR

## 2017-06-26 NOTE — Patient Instructions (Signed)
Knee Exercises Ask your health care provider which exercises are safe for you. Do exercises exactly as told by your health care provider and adjust them as directed. It is normal to feel mild stretching, pulling, tightness, or discomfort as you do these exercises, but you should stop right away if you feel sudden pain or your pain gets worse.Do not begin these exercises until told by your health care provider. STRETCHING AND RANGE OF MOTION EXERCISES These exercises warm up your muscles and joints and improve the movement and flexibility of your knee. These exercises also help to relieve pain, numbness, and tingling. Exercise A: Knee Extension, Prone 1. Lie on your abdomen on a bed. 2. Place your left / right knee just beyond the edge of the surface so your knee is not on the bed. You can put a towel under your left / right thigh just above your knee for comfort. 3. Relax your leg muscles and allow gravity to straighten your knee. You should feel a stretch behind your left / right knee. 4. Hold this position for __________ seconds. 5. Scoot up so your knee is supported between repetitions. Repeat __________ times. Complete this stretch __________ times a day. Exercise B: Knee Flexion, Active  1. Lie on your back with both knees straight. If this causes back discomfort, bend your left / right knee so your foot is flat on the floor. 2. Slowly slide your left / right heel back toward your buttocks until you feel a gentle stretch in the front of your knee or thigh. 3. Hold this position for __________ seconds. 4. Slowly slide your left / right heel back to the starting position. Repeat __________ times. Complete this exercise __________ times a day. Exercise C: Quadriceps, Prone  1. Lie on your abdomen on a firm surface, such as a bed or padded floor. 2. Bend your left / right knee and hold your ankle. If you cannot reach your ankle or pant leg, loop a belt around your foot and grab the belt  instead. 3. Gently pull your heel toward your buttocks. Your knee should not slide out to the side. You should feel a stretch in the front of your thigh and knee. 4. Hold this position for __________ seconds. Repeat __________ times. Complete this stretch __________ times a day. Exercise D: Hamstring, Supine 1. Lie on your back. 2. Loop a belt or towel over the ball of your left / right foot. The ball of your foot is on the walking surface, right under your toes. 3. Straighten your left / right knee and slowly pull on the belt to raise your leg until you feel a gentle stretch behind your knee. ? Do not let your left / right knee bend while you do this. ? Keep your other leg flat on the floor. 4. Hold this position for __________ seconds. Repeat __________ times. Complete this stretch __________ times a day. STRENGTHENING EXERCISES These exercises build strength and endurance in your knee. Endurance is the ability to use your muscles for a long time, even after they get tired. Exercise E: Quadriceps, Isometric  1. Lie on your back with your left / right leg extended and your other knee bent. Put a rolled towel or small pillow under your knee if told by your health care provider. 2. Slowly tense the muscles in the front of your left / right thigh. You should see your kneecap slide up toward your hip or see increased dimpling just above the knee. This   motion will push the back of the knee toward the floor. 3. For __________ seconds, keep the muscle as tight as you can without increasing your pain. 4. Relax the muscles slowly and completely. Repeat __________ times. Complete this exercise __________ times a day. Exercise F: Straight Leg Raises - Quadriceps 1. Lie on your back with your left / right leg extended and your other knee bent. 2. Tense the muscles in the front of your left / right thigh. You should see your kneecap slide up or see increased dimpling just above the knee. Your thigh may  even shake a bit. 3. Keep these muscles tight as you raise your leg 4-6 inches (10-15 cm) off the floor. Do not let your knee bend. 4. Hold this position for __________ seconds. 5. Keep these muscles tense as you lower your leg. 6. Relax your muscles slowly and completely after each repetition. Repeat __________ times. Complete this exercise __________ times a day. Exercise G: Hamstring, Isometric 1. Lie on your back on a firm surface. 2. Bend your left / right knee approximately __________ degrees. 3. Dig your left / right heel into the surface as if you are trying to pull it toward your buttocks. Tighten the muscles in the back of your thighs to dig as hard as you can without increasing any pain. 4. Hold this position for __________ seconds. 5. Release the tension gradually and allow your muscles to relax completely for __________ seconds after each repetition. Repeat __________ times. Complete this exercise __________ times a day. Exercise H: Hamstring Curls  If told by your health care provider, do this exercise while wearing ankle weights. Begin with __________ weights. Then increase the weight by 1 lb (0.5 kg) increments. Do not wear ankle weights that are more than __________. 1. Lie on your abdomen with your legs straight. 2. Bend your left / right knee as far as you can without feeling pain. Keep your hips flat against the floor. 3. Hold this position for __________ seconds. 4. Slowly lower your leg to the starting position.  Repeat __________ times. Complete this exercise __________ times a day. Exercise I: Squats (Quadriceps) 1. Stand in front of a table, with your feet and knees pointing straight ahead. You may rest your hands on the table for balance but not for support. 2. Slowly bend your knees and lower your hips like you are going to sit in a chair. ? Keep your weight over your heels, not over your toes. ? Keep your lower legs upright so they are parallel with the table  legs. ? Do not let your hips go lower than your knees. ? Do not bend lower than told by your health care provider. ? If your knee pain increases, do not bend as low. 3. Hold the squat position for __________ seconds. 4. Slowly push with your legs to return to standing. Do not use your hands to pull yourself to standing. Repeat __________ times. Complete this exercise __________ times a day. Exercise J: Wall Slides (Quadriceps)  1. Lean your back against a smooth wall or door while you walk your feet out 18-24 inches (46-61 cm) from it. 2. Place your feet hip-width apart. 3. Slowly slide down the wall or door until your knees bend __________ degrees. Keep your knees over your heels, not over your toes. Keep your knees in line with your hips. 4. Hold for __________ seconds. Repeat __________ times. Complete this exercise __________ times a day. Exercise K: Straight Leg Raises -   Hip Abductors 1. Lie on your side with your left / right leg in the top position. Lie so your head, shoulder, knee, and hip line up. You may bend your bottom knee to help you keep your balance. 2. Roll your hips slightly forward so your hips are stacked directly over each other and your left / right knee is facing forward. 3. Leading with your heel, lift your top leg 4-6 inches (10-15 cm). You should feel the muscles in your outer hip lifting. ? Do not let your foot drift forward. ? Do not let your knee roll toward the ceiling. 4. Hold this position for __________ seconds. 5. Slowly return your leg to the starting position. 6. Let your muscles relax completely after each repetition. Repeat __________ times. Complete this exercise __________ times a day. Exercise L: Straight Leg Raises - Hip Extensors 1. Lie on your abdomen on a firm surface. You can put a pillow under your hips if that is more comfortable. 2. Tense the muscles in your buttocks and lift your left / right leg about 4-6 inches (10-15 cm). Keep your knee  straight as you lift your leg. 3. Hold this position for __________ seconds. 4. Slowly lower your leg to the starting position. 5. Let your leg relax completely after each repetition. Repeat __________ times. Complete this exercise __________ times a day. This information is not intended to replace advice given to you by your health care provider. Make sure you discuss any questions you have with your health care provider. Document Released: 02/01/2005 Document Revised: 12/13/2015 Document Reviewed: 01/24/2015 Elsevier Interactive Patient Education  2018 [employer] Hand Exercises Hand exercises can be helpful to almost anyone. These exercises can strengthen the hands, improve flexibility and movement, and increase blood flow to the hands. These results can make work and daily tasks easier. Hand exercises can be especially helpful for people who have joint pain from arthritis or have nerve damage from overuse (carpal tunnel syndrome). These exercises can also help people who have injured a hand. Most of these hand exercises are fairly gentle stretching routines. You can do them often throughout the day. Still, it is a good idea to ask your health care provider which exercises would be best for you. Warming your hands before exercise may help to reduce stiffness. You can do this with gentle massage or by placing your hands in warm water for 15 minutes. Also, make sure you pay attention to your level of hand pain as you begin an exercise routine. Exercises Knuckle Bend Repeat this exercise 5-10 times with each hand. 1. Stand or sit with your arm, hand, and all five fingers pointed straight up. Make sure your wrist is straight. 2. Gently and slowly bend your fingers down and inward until the tips of your fingers are touching the tops of your palm. 3. Hold this position for a few seconds. 4. Extend your fingers out to their original position, all pointing straight up again.  Finger Fan Repeat this  exercise 5-10 times with each hand. 1. Hold your arm and hand out in front of you. Keep your wrist straight. 2. Squeeze your hand into a fist. 3. Hold this position for a few seconds. 4. Fan out, or spread apart, your hand and fingers as much as possible, stretching every joint fully.  Tabletop Repeat this exercise 5-10 times with each hand. 1. Stand or sit with your arm, hand, and all five fingers pointed straight up. Make sure your wrist is straight.   2. Gently and slowly bend your fingers at the knuckles where they meet the hand until your hand is making an upside-down L shape. Your fingers should form a tabletop. 3. Hold this position for a few seconds. 4. Extend your fingers out to their original position, all pointing straight up again.  Making Os Repeat this exercise 5-10 times with each hand. 1. Stand or sit with your arm, hand, and all five fingers pointed straight up. Make sure your wrist is straight. 2. Make an O shape by touching your pointer finger to your thumb. Hold for a few seconds. Then open your hand wide. 3. Repeat this motion with each finger on your hand.  Table Spread Repeat this exercise 5-10 times with each hand. 1. Place your hand on a table with your palm facing down. Make sure your wrist is straight. 2. Spread your fingers out as much as possible. Hold this position for a few seconds. 3. Slide your fingers back together again. Hold for a few seconds.  Ball Grip  Repeat this exercise 10-15 times with each hand. 1. Hold a tennis ball or another soft ball in your hand. 2. While slowly increasing pressure, squeeze the ball as hard as possible. 3. Squeeze as hard as you can for 3-5 seconds. 4. Relax and repeat.  Wrist Curls Repeat this exercise 10-15 times with each hand. 1. Sit in a chair that has armrests. 2. Hold a light weight in your hand, such as a dumbbell that weighs 1-3 pounds (0.5-1.4 kg). Ask your health care provider what weight would be best for  you. 3. Rest your hand just over the end of the chair arm with your palm facing up. 4. Gently pivot your wrist up and down while holding the weight. Do not twist your wrist from side to side.  Contact a health care provider if:  Your hand pain or discomfort gets much worse when you do an exercise.  Your hand pain or discomfort does not improve within 2 hours after you exercise. If you have any of these problems, stop doing these exercises right away. Do not do them again unless your health care provider says that you can. Get help right away if:  You develop sudden, severe hand pain. If this happens, stop doing these exercises right away. Do not do them again unless your health care provider says that you can. This information is not intended to replace advice given to you by your health care provider. Make sure you discuss any questions you have with your health care provider. Document Released: 03/01/2015 Document Revised: 08/26/2015 Document Reviewed: 09/28/2014 Elsevier Interactive Patient Education  2018 [employer]  

## 2017-07-17 ENCOUNTER — Ambulatory Visit: Payer: BLUE CROSS/BLUE SHIELD | Admitting: Rheumatology

## 2017-07-27 ENCOUNTER — Ambulatory Visit: Payer: BLUE CROSS/BLUE SHIELD | Admitting: Physician Assistant

## 2017-07-27 DIAGNOSIS — M1711 Unilateral primary osteoarthritis, right knee: Secondary | ICD-10-CM | POA: Diagnosis not present

## 2017-07-27 DIAGNOSIS — L97921 Non-pressure chronic ulcer of unspecified part of left lower leg limited to breakdown of skin: Secondary | ICD-10-CM

## 2017-07-27 MED ORDER — DOXYCYCLINE HYCLATE 50 MG PO CAPS: 50.0000 mg | ORAL_CAPSULE | Freq: Two times a day (BID) | ORAL | 0 refills | Status: DC

## 2017-07-27 MED ORDER — CROSS-LINKED HYALURONATE 30 MG/3ML IX PRSY
30.0000 mg | PREFILLED_SYRINGE | INTRA_ARTICULAR | Status: AC | PRN
Start: 2017-07-27 — End: 2017-07-27
  Administered 2017-07-27: 30 mg via INTRA_ARTICULAR

## 2017-07-27 MED ORDER — LIDOCAINE HCL 1 % IJ SOLN
1.5000 mL | INTRAMUSCULAR | Status: AC | PRN
Start: 2017-07-27 — End: 2017-07-27
  Administered 2017-07-27: 1.5 mL

## 2017-07-27 NOTE — Progress Notes (Signed)
   Procedure Note  Patient: Amanda Hammond             Date of Birth: 03/16/1971           MRN: [id number]             Visit Date: 07/27/2017  Procedures: Visit Diagnoses: Primary osteoarthritis of right knee Gel One, right knee P/P  Large Joint Inj: R knee on 07/27/2017 8:03 AM Indications: pain Details: 25 G 1.5 in needle, medial approach  Arthrogram: No  Medications: 30 mg Cross-Linked Hyaluronate 30 MG/3ML; 1.5 mL lidocaine 1 % Aspirate: 0 mL Outcome: tolerated well, no immediate complications Procedure, treatment alternatives, risks and benefits explained, specific risks discussed. Consent was given by the patient. Immediately prior to procedure a time out was called to verify the correct patient, procedure, equipment, support staff and site/side marked as required. Patient was prepped and draped in the usual sterile fashion.     Patient tolerated the procedure well.    Patient has a left lower extremity ulceration limited to breakdown of skin with surrounding erythema.  No drainage apparent.  She denies any fevers or chills.  We cleaned the wound with betadine and saline.  Band-aid was applied.  She was advised to keep the wound clean and dry.  A prescription for doxycycline was sent to the pharmacy.  Side effects were discussed. She has no known drug allergies. She was advised to make an appointment with her PCP for re-evaluation of the wound.   Sherron Ales, PA-C

## 2017-08-29 DIAGNOSIS — Z79899 Other long term (current) drug therapy: Secondary | ICD-10-CM | POA: Diagnosis not present

## 2017-09-05 NOTE — Progress Notes (Signed)
Office Visit Note  Patient: Amanda Hammond             Date of Birth: [birth year]           MRN: [id number]             PCP: Kela Millin, MD Referring: Louie Boston., MD Visit Date: 09/06/2017 Occupation: @GUAROCC @    Subjective:  Right knee pain   History of Present Illness: Amanda Hammond is a 47 y.o. female with history of fibromyalgia, DDD, osteoarthritis.  Patient states she continues to take Cymbalta 60 mg daily and Lyrica 150 mg 3 times daily.  She also takes Zanaflex 4 mg as needed for muscle spasms.  She reports that her fibromyalgia has been flaring more frequently lately.  She states she has tenderness and tension in the trapezius region bilaterally.  She states she is also having right SI joint pain as well as trochanteric bursitis bilaterally.  She states that her neck is been causing increased discomfort and stiffness which is been leading to more frequent headaches.  She states that she has noticed some increased pain in her bilateral hands especially her right CMC joint.  She states that her right elbow is also been causing the increased discomfort but denies any swelling.  She states that after her gel injection on 07/27/2017 she started having more pain and swelling in her right knee.  She states that her right knee also feels warm.  She states that she has been icing her knee on a regular basis as well as taking ibuprofen and Tylenol for pain relief.  She states that she has also having some radiation of pain and pain in the popliteal region. She denies any mechanical symptoms at this time.  She states at times she experiences a grinding sensation.   Activities of Daily Living:  Patient reports morning stiffness for 30 minutes.   Patient Reports nocturnal pain.  Difficulty dressing/grooming: Denies Difficulty climbing stairs: Reports Difficulty getting out of chair: Reports Difficulty using hands for taps, buttons, cutlery, and/or writing: Reports   Review of  Systems  Constitutional: Positive for fatigue.  HENT: Positive for mouth dryness. Negative for mouth sores and nose dryness.   Eyes: Negative for pain, visual disturbance and dryness.  Respiratory: Negative for cough, hemoptysis, shortness of breath and difficulty breathing.   Cardiovascular: Negative for chest pain, palpitations, hypertension and swelling in legs/feet.  Gastrointestinal: Negative for abdominal pain, blood in stool, constipation and diarrhea.  Endocrine: Negative for increased urination.  Genitourinary: Negative for painful urination and pelvic pain.  Musculoskeletal: Positive for arthralgias, joint pain, joint swelling and morning stiffness. Negative for myalgias, muscle weakness, muscle tenderness and myalgias.  Skin: Negative for color change, pallor, rash, hair loss, nodules/bumps, skin tightness, ulcers and sensitivity to sunlight.  Allergic/Immunologic: Negative for susceptible to infections.  Neurological: Positive for headaches. Negative for dizziness, numbness and weakness.  Hematological: Negative for swollen glands.  Psychiatric/Behavioral: Negative for depressed mood and sleep disturbance. The patient is nervous/anxious.     PMFS History:  Patient Active Problem List   Diagnosis Date Noted  . Rheumatoid factor positive 11/06/2016  . Trochanteric bursitis of both hips 11/06/2016  . History of fibromyalgia 11/06/2016  . DDD (degenerative disc disease), cervical 11/06/2016  . Carpal tunnel syndrome, left upper limb 11/06/2016  . Carpal tunnel syndrome, right upper limb 11/06/2016  . Trigger finger, left middle finger 11/06/2016  . Major depressive disorder, recurrent episode, moderate (HCC) 04/27/2016  .  Pain in left hand 04/25/2016  . Pain in right hand 04/25/2016  . Primary osteoarthritis of both hands 04/25/2016  . Primary osteoarthritis of both knees 04/25/2016  . Primary osteoarthritis of both feet 04/25/2016  . Fibromyalgia 02/17/2016  . Other  insomnia 02/17/2016  . Fatigue 02/17/2016  . B12 DEFICIENCY 02/01/2010  . Vitamin D deficiency 02/01/2010  . ANEMIA, IRON DEFICIENCY 12/30/2009  . OTHER DYSPNEA AND RESPIRATORY ABNORMALITIES 12/30/2009  . Nonspecific (abnormal) findings on radiological and other examination of body structure 12/30/2009  . COMPUTERIZED TOMOGRAPHY, CHEST, ABNORMAL 12/30/2009    Past Medical History:  Diagnosis Date  . Anemia   . Fibromyositis   . Hypertension   . Osteoarthritis   . PONV (postoperative nausea and vomiting)   . Vitamin D deficiency     Family History  Problem Relation Age of Onset  . Diabetes Mother   . Hypertension Mother   . Hypertension Father   . Hypertension Brother   . Osteoarthritis Brother   . Hypertension Brother   . Osteoarthritis Brother   . Endometriosis Daughter   . Anesthesia problems Neg Hx   . Hypotension Neg Hx   . Pseudochol deficiency Neg Hx   . Malignant hyperthermia Neg Hx    Past Surgical History:  Procedure Laterality Date  . APPENDECTOMY  2010   [facility]  . CARPAL TUNNEL RELEASE Left   . CHOLECYSTECTOMY  1999   lap, [facility]  . GASTRIC BYPASS  2003   [facility]  . HERNIA REPAIR  08/2010, 02/2010    incisional, [facility], [facility]  . INCISIONAL HERNIA REPAIR  10/12/2010   Procedure: HERNIA REPAIR INCISIONAL;  Surgeon: Dalia Heading;  Location: [facility];  Service: General;  Laterality: N/A;  Recurrent Incisional Hernia Repair with Mesh  . PANNICULECTOMY  2008   [facility]  . TENNIS ELBOW RELEASE/NIRSCHEL PROCEDURE Bilateral    Social History   Social History Narrative  . Not on file     Objective: Vital Signs: BP 109/79 (BP Location: Left Arm, Patient Position: Sitting, Cuff Size: Normal)   Pulse (!) 107   Resp 18   Ht 5\' 6"  (1.676 m)   Wt 269 lb (122 kg)   BMI 43.42 kg/m    Physical Exam  Constitutional: She is oriented to person, place, and time. She appears well-developed and well-nourished.  HENT:  Head: Normocephalic and atraumatic.  Eyes: Conjunctivae and  EOM are normal.  Neck: Normal range of motion.  Cardiovascular: Normal rate, regular rhythm, normal heart sounds and intact distal pulses.  Pulmonary/Chest: Effort normal and breath sounds normal.  Abdominal: Soft. Bowel sounds are normal.  Lymphadenopathy:    She has no cervical adenopathy.  Neurological: She is alert and oriented to person, place, and time.  Skin: Skin is warm and dry. Capillary refill takes less than 2 seconds.  Psychiatric: She has a normal mood and affect. Her behavior is normal.  Nursing note and vitals reviewed.    Musculoskeletal Exam: C-spine limited ROM.  Thoracic spine and lumbar spine good ROM.  No midline spinal tenderness. Right SI joint tenderness.right shoulder limited range of motion.  Left shoulder full range of motion.  She has tenderness in bilateral trapezius regions.  Elbow joints, wrist joints, MCPs, PIPs, DIPs good range of motion with no synovitis.  Hip joints, knee joints, ankle joints, MTPs, PIPs, DIPs good range of motion with no synovitis.  She has warmth of right knee and pain with ROM.  She has tenderness in the popliteal region.  No erythema noted.  Left knee good ROM.   CDAI Exam: No CDAI exam completed.    Investigation: No additional findings. CBC Latest Ref Rng & Units 03/21/2017 10/14/2010 10/13/2010  WBC 3.8 - 10.8 Thousand/uL 8.3 9.2 12.5(H)  Hemoglobin 11.7 - 15.5 g/dL 10.9(L) 10.0(L) 10.6(L)  Hematocrit 35.0 - 45.0 % 34.4(L) 32.3(L) 32.9(L)  Platelets 140 - 400 Thousand/uL 328 279 310   CMP Latest Ref Rng & Units 03/21/2017 10/14/2010 10/13/2010  Glucose 65 - 99 mg/dL 546(T) 98 035(W)  BUN 7 - 25 mg/dL 6(L) 7 10  Creatinine 6.56 - 1.10 mg/dL 8.12 7.51(Z) 0.01  Sodium 135 - 146 mmol/L 139 131(L) 134(L)  Potassium 3.5 - 5.3 mmol/L 3.7 3.3(L) 4.2  Chloride 98 - 110 mmol/L 103 92(L) 96  CO2 20 - 32 mmol/L 29 31 31   Calcium 8.6 - 10.2 mg/dL 9.0 8.5 8.8  Total Protein 6.1 - 8.1 g/dL 6.4 - -  Total Bilirubin 0.2 - 1.2 mg/dL 0.4 - -    Alkaline Phos 39 - 117 U/L - - -  AST 10 - 35 U/L 11 - -  ALT 6 - 29 U/L 9 - -    Imaging: No results found.  Speciality Comments: No specialty comments available.    Procedures:  Large Joint Inj: R knee on 09/06/2017 3:56 PM Indications: pain Details: 27 G 1.5 in needle, medial approach  Arthrogram: No  Medications: 1.5 mL lidocaine 1 %; 60 mg triamcinolone acetonide 40 MG/ML Aspirate: 0 mL Outcome: tolerated well, no immediate complications Procedure, treatment alternatives, risks and benefits explained, specific risks discussed. Consent was given by the patient. Immediately prior to procedure a time out was called to verify the correct patient, procedure, equipment, support staff and site/side marked as required. Patient was prepped and draped in the usual sterile fashion.     Allergies: Patient has no known allergies.   Assessment / Plan:     Visit Diagnoses: Fibromyalgia -she has been having more frequent and severe fibromyalgia flares.  She continues to have generalized muscle aches and muscle tenderness.  She has tenderness and tension in the trapezius region bilaterally.  She also has bilateral trochanteric bursitis.  She has generalized hyperalgesia on exam today.  She continues to take Cymbalta 60 mg, Lyrica 100 mg 3 times daily and Zanaflex 4 mg as needed for muscle spasms.  She is also on Nucynta for pain relief.  She has chronic fatigue and insomnia.  She has not been sleeping well at night due to the pain.  Other fatigue: Chronic and related to insomnia.  Other insomnia: She continues to have chronic insomnia.  The pain at night has been keeping her up and causing interrupted sleep.  Sleep hygiene was discussed.  DDD (degenerative disc disease), cervical: Limited range of motion on exam.  She also tenderness and tension in the trapezius region bilaterally.  Is encouraged to continue to work on range of motion of her C-spine.  Primary osteoarthritis of both hands: No  synovitis was noted.  She is been having increased discomfort in her joints but no swelling was noted.  Joint protection and muscle strengthening were discussed.  She has very dry skin on her hands and was advised to try Aquaphor.  Primary osteoarthritis of both knees: Patient had a gel 1 injection 07/27/2017 her right knee.  She has been having increased right knee pain as well as warmth and mild swelling.  She has been taking Tylenol and ibuprofen for pain relief.  She is also been icing her knee.  She has warmth and limited range of motion on exam today.  She has some tenderness in the popliteal region.  No erythema was noted.  She has not had any recent fevers or chills.  Is advised to continue to take ibuprofen and Tylenol as well as ice her knee.  A cortisone injection was performed today in her right knee.  She tolerated procedure well.  She was advised to notify us if her knee pain does not improve.  If her knee pain continues we will schedule an MRI of her right knee.  She does not have any mechanical symptoms at this time but states that she experiences a grinding sensation occasionally with range of motion.  Trochanteric bursitis of both hips: Tenderness of bilateral trochanteric bursa.  She is advised to continue to report to exercise on a regular basis.  Primary osteoarthritis of both feet: Mild PIP and DIP synovial thickening consistent with osteoarthritis of bilateral feet.  She is having discomfort in her feet at this time.  Other medical conditions are listed as follows:  Carpal tunnel syndrome, right upper limb  Major depressive disorder, recurrent episode, moderate (HCC) - She is on Cymbalta.  Vitamin D deficiency  Chronic pain of right knee    Orders: Orders Placed This Encounter  Procedures  . Large Joint Inj   No orders of the defined types were placed in this encounter.   Face-to-face time spent with patient was 30 minutes. >50% of time was spent in counseling and  coordination of care.  Follow-Up Instructions: Return in about 6 months (around 03/08/2018) for Fibromyalgia, Osteoarthritis, DDD.   Gearldine Bienenstock, PA-C  Note - This record has been created using Dragon software.  Chart creation errors have been sought, but may not always  have been located. Such creation errors do not reflect on  the standard of medical care.

## 2017-09-06 ENCOUNTER — Encounter: Payer: Self-pay | Admitting: Physician Assistant

## 2017-09-06 ENCOUNTER — Ambulatory Visit: Payer: BLUE CROSS/BLUE SHIELD | Admitting: Physician Assistant

## 2017-09-06 VITALS — BP 109/79 | HR 107 | Resp 18 | Ht 66.0 in | Wt 269.0 lb

## 2017-09-06 DIAGNOSIS — R5383 Other fatigue: Secondary | ICD-10-CM

## 2017-09-06 DIAGNOSIS — E559 Vitamin D deficiency, unspecified: Secondary | ICD-10-CM

## 2017-09-06 DIAGNOSIS — M19041 Primary osteoarthritis, right hand: Secondary | ICD-10-CM | POA: Diagnosis not present

## 2017-09-06 DIAGNOSIS — M19072 Primary osteoarthritis, left ankle and foot: Secondary | ICD-10-CM

## 2017-09-06 DIAGNOSIS — G8929 Other chronic pain: Secondary | ICD-10-CM | POA: Diagnosis not present

## 2017-09-06 DIAGNOSIS — M7061 Trochanteric bursitis, right hip: Secondary | ICD-10-CM

## 2017-09-06 DIAGNOSIS — M5030 Other cervical disc degeneration, unspecified cervical region: Secondary | ICD-10-CM

## 2017-09-06 DIAGNOSIS — M797 Fibromyalgia: Secondary | ICD-10-CM

## 2017-09-06 DIAGNOSIS — M7062 Trochanteric bursitis, left hip: Secondary | ICD-10-CM

## 2017-09-06 DIAGNOSIS — M170 Bilateral primary osteoarthritis of knee: Secondary | ICD-10-CM

## 2017-09-06 DIAGNOSIS — M25561 Pain in right knee: Secondary | ICD-10-CM

## 2017-09-06 DIAGNOSIS — M19042 Primary osteoarthritis, left hand: Secondary | ICD-10-CM

## 2017-09-06 DIAGNOSIS — M19071 Primary osteoarthritis, right ankle and foot: Secondary | ICD-10-CM | POA: Diagnosis not present

## 2017-09-06 DIAGNOSIS — G5601 Carpal tunnel syndrome, right upper limb: Secondary | ICD-10-CM

## 2017-09-06 DIAGNOSIS — G4709 Other insomnia: Secondary | ICD-10-CM

## 2017-09-06 DIAGNOSIS — F331 Major depressive disorder, recurrent, moderate: Secondary | ICD-10-CM | POA: Diagnosis not present

## 2017-09-06 MED ORDER — TRIAMCINOLONE ACETONIDE 40 MG/ML IJ SUSP
60.0000 mg | INTRAMUSCULAR | Status: AC | PRN
  Administered 2017-09-06: 60 mg via INTRA_ARTICULAR

## 2017-09-06 MED ORDER — LIDOCAINE HCL 1 % IJ SOLN
1.5000 mL | INTRAMUSCULAR | Status: AC | PRN
  Administered 2017-09-06: 1.5 mL

## 2017-09-19 ENCOUNTER — Ambulatory Visit: Payer: BLUE CROSS/BLUE SHIELD | Admitting: Rheumatology

## 2017-09-26 DIAGNOSIS — Z79899 Other long term (current) drug therapy: Secondary | ICD-10-CM | POA: Diagnosis not present

## 2017-09-26 DIAGNOSIS — M069 Rheumatoid arthritis, unspecified: Secondary | ICD-10-CM | POA: Diagnosis not present

## 2017-09-26 DIAGNOSIS — M1990 Unspecified osteoarthritis, unspecified site: Secondary | ICD-10-CM | POA: Diagnosis not present

## 2017-09-26 DIAGNOSIS — M797 Fibromyalgia: Secondary | ICD-10-CM | POA: Diagnosis not present

## 2017-10-08 DIAGNOSIS — J40 Bronchitis, not specified as acute or chronic: Secondary | ICD-10-CM | POA: Diagnosis not present

## 2017-10-12 DIAGNOSIS — M797 Fibromyalgia: Secondary | ICD-10-CM | POA: Diagnosis not present

## 2017-10-12 DIAGNOSIS — J40 Bronchitis, not specified as acute or chronic: Secondary | ICD-10-CM | POA: Diagnosis not present

## 2017-10-12 DIAGNOSIS — I10 Essential (primary) hypertension: Secondary | ICD-10-CM | POA: Diagnosis not present

## 2017-10-12 DIAGNOSIS — Z86711 Personal history of pulmonary embolism: Secondary | ICD-10-CM | POA: Diagnosis not present

## 2017-10-12 DIAGNOSIS — Z79899 Other long term (current) drug therapy: Secondary | ICD-10-CM | POA: Diagnosis not present

## 2017-10-12 DIAGNOSIS — M069 Rheumatoid arthritis, unspecified: Secondary | ICD-10-CM | POA: Diagnosis not present

## 2017-10-12 DIAGNOSIS — R05 Cough: Secondary | ICD-10-CM | POA: Diagnosis not present

## 2017-10-24 DIAGNOSIS — S0990XA Unspecified injury of head, initial encounter: Secondary | ICD-10-CM | POA: Diagnosis not present

## 2017-10-24 DIAGNOSIS — M545 Low back pain: Secondary | ICD-10-CM | POA: Diagnosis not present

## 2017-10-24 DIAGNOSIS — M1711 Unilateral primary osteoarthritis, right knee: Secondary | ICD-10-CM | POA: Diagnosis not present

## 2017-10-24 DIAGNOSIS — M79661 Pain in right lower leg: Secondary | ICD-10-CM | POA: Diagnosis not present

## 2017-10-24 DIAGNOSIS — W01198A Fall on same level from slipping, tripping and stumbling with subsequent striking against other object, initial encounter: Secondary | ICD-10-CM | POA: Diagnosis not present

## 2017-10-24 DIAGNOSIS — Z86711 Personal history of pulmonary embolism: Secondary | ICD-10-CM | POA: Diagnosis not present

## 2017-10-24 DIAGNOSIS — R110 Nausea: Secondary | ICD-10-CM | POA: Diagnosis not present

## 2017-10-24 DIAGNOSIS — S060X1A Concussion with loss of consciousness of 30 minutes or less, initial encounter: Secondary | ICD-10-CM | POA: Diagnosis not present

## 2017-10-24 DIAGNOSIS — M25561 Pain in right knee: Secondary | ICD-10-CM | POA: Diagnosis not present

## 2017-10-24 DIAGNOSIS — M069 Rheumatoid arthritis, unspecified: Secondary | ICD-10-CM | POA: Diagnosis not present

## 2017-10-24 DIAGNOSIS — S060X0A Concussion without loss of consciousness, initial encounter: Secondary | ICD-10-CM | POA: Diagnosis not present

## 2017-10-24 DIAGNOSIS — Z79899 Other long term (current) drug therapy: Secondary | ICD-10-CM | POA: Diagnosis not present

## 2017-10-24 DIAGNOSIS — I10 Essential (primary) hypertension: Secondary | ICD-10-CM | POA: Diagnosis not present

## 2017-10-24 DIAGNOSIS — S199XXA Unspecified injury of neck, initial encounter: Secondary | ICD-10-CM | POA: Diagnosis not present

## 2017-10-24 DIAGNOSIS — M542 Cervicalgia: Secondary | ICD-10-CM | POA: Diagnosis not present

## 2017-10-24 DIAGNOSIS — M797 Fibromyalgia: Secondary | ICD-10-CM | POA: Diagnosis not present

## 2017-10-26 DIAGNOSIS — M069 Rheumatoid arthritis, unspecified: Secondary | ICD-10-CM | POA: Diagnosis not present

## 2017-10-26 DIAGNOSIS — M797 Fibromyalgia: Secondary | ICD-10-CM | POA: Diagnosis not present

## 2017-10-26 DIAGNOSIS — Z79899 Other long term (current) drug therapy: Secondary | ICD-10-CM | POA: Diagnosis not present

## 2017-10-26 DIAGNOSIS — M1990 Unspecified osteoarthritis, unspecified site: Secondary | ICD-10-CM | POA: Diagnosis not present

## 2017-11-05 DIAGNOSIS — R600 Localized edema: Secondary | ICD-10-CM | POA: Diagnosis not present

## 2017-11-05 DIAGNOSIS — Z86718 Personal history of other venous thrombosis and embolism: Secondary | ICD-10-CM | POA: Diagnosis not present

## 2017-11-26 DIAGNOSIS — M1990 Unspecified osteoarthritis, unspecified site: Secondary | ICD-10-CM | POA: Diagnosis not present

## 2017-11-26 DIAGNOSIS — M797 Fibromyalgia: Secondary | ICD-10-CM | POA: Diagnosis not present

## 2017-11-26 DIAGNOSIS — M069 Rheumatoid arthritis, unspecified: Secondary | ICD-10-CM | POA: Diagnosis not present

## 2017-11-26 DIAGNOSIS — Z79899 Other long term (current) drug therapy: Secondary | ICD-10-CM | POA: Diagnosis not present

## 2017-12-05 DIAGNOSIS — M797 Fibromyalgia: Secondary | ICD-10-CM | POA: Diagnosis not present

## 2017-12-05 DIAGNOSIS — M1990 Unspecified osteoarthritis, unspecified site: Secondary | ICD-10-CM | POA: Diagnosis not present

## 2017-12-05 DIAGNOSIS — I10 Essential (primary) hypertension: Secondary | ICD-10-CM | POA: Diagnosis not present

## 2017-12-10 DIAGNOSIS — G8929 Other chronic pain: Secondary | ICD-10-CM | POA: Diagnosis not present

## 2017-12-10 DIAGNOSIS — M25461 Effusion, right knee: Secondary | ICD-10-CM | POA: Diagnosis not present

## 2017-12-10 DIAGNOSIS — M25561 Pain in right knee: Secondary | ICD-10-CM | POA: Diagnosis not present

## 2017-12-14 DIAGNOSIS — M7121 Synovial cyst of popliteal space [Baker], right knee: Secondary | ICD-10-CM | POA: Diagnosis not present

## 2017-12-14 DIAGNOSIS — S83281A Other tear of lateral meniscus, current injury, right knee, initial encounter: Secondary | ICD-10-CM | POA: Diagnosis not present

## 2017-12-14 DIAGNOSIS — M1711 Unilateral primary osteoarthritis, right knee: Secondary | ICD-10-CM | POA: Diagnosis not present

## 2017-12-14 DIAGNOSIS — M23361 Other meniscus derangements, other lateral meniscus, right knee: Secondary | ICD-10-CM | POA: Diagnosis not present

## 2017-12-21 DIAGNOSIS — J40 Bronchitis, not specified as acute or chronic: Secondary | ICD-10-CM | POA: Diagnosis not present

## 2017-12-24 DIAGNOSIS — S83271A Complex tear of lateral meniscus, current injury, right knee, initial encounter: Secondary | ICD-10-CM | POA: Diagnosis not present

## 2017-12-27 ENCOUNTER — Ambulatory Visit: Payer: BLUE CROSS/BLUE SHIELD | Admitting: Rheumatology

## 2018-01-01 HISTORY — PX: KNEE SURGERY: SHX244

## 2018-01-02 DIAGNOSIS — M1990 Unspecified osteoarthritis, unspecified site: Secondary | ICD-10-CM | POA: Diagnosis not present

## 2018-01-02 DIAGNOSIS — M797 Fibromyalgia: Secondary | ICD-10-CM | POA: Diagnosis not present

## 2018-01-02 DIAGNOSIS — Z8249 Family history of ischemic heart disease and other diseases of the circulatory system: Secondary | ICD-10-CM | POA: Diagnosis not present

## 2018-01-02 DIAGNOSIS — S83271A Complex tear of lateral meniscus, current injury, right knee, initial encounter: Secondary | ICD-10-CM | POA: Diagnosis not present

## 2018-01-02 DIAGNOSIS — Z833 Family history of diabetes mellitus: Secondary | ICD-10-CM | POA: Diagnosis not present

## 2018-01-02 DIAGNOSIS — M21861 Other specified acquired deformities of right lower leg: Secondary | ICD-10-CM | POA: Diagnosis not present

## 2018-01-02 DIAGNOSIS — M94261 Chondromalacia, right knee: Secondary | ICD-10-CM | POA: Diagnosis not present

## 2018-01-02 DIAGNOSIS — Z823 Family history of stroke: Secondary | ICD-10-CM | POA: Diagnosis not present

## 2018-01-02 DIAGNOSIS — Z9884 Bariatric surgery status: Secondary | ICD-10-CM | POA: Diagnosis not present

## 2018-01-02 DIAGNOSIS — Z8261 Family history of arthritis: Secondary | ICD-10-CM | POA: Diagnosis not present

## 2018-01-02 DIAGNOSIS — Z86711 Personal history of pulmonary embolism: Secondary | ICD-10-CM | POA: Diagnosis not present

## 2018-01-02 DIAGNOSIS — Z8489 Family history of other specified conditions: Secondary | ICD-10-CM | POA: Diagnosis not present

## 2018-01-02 DIAGNOSIS — M069 Rheumatoid arthritis, unspecified: Secondary | ICD-10-CM | POA: Diagnosis not present

## 2018-01-02 DIAGNOSIS — Z79899 Other long term (current) drug therapy: Secondary | ICD-10-CM | POA: Diagnosis not present

## 2018-01-02 DIAGNOSIS — I10 Essential (primary) hypertension: Secondary | ICD-10-CM | POA: Diagnosis not present

## 2018-01-04 DIAGNOSIS — Z8261 Family history of arthritis: Secondary | ICD-10-CM | POA: Diagnosis not present

## 2018-01-04 DIAGNOSIS — M797 Fibromyalgia: Secondary | ICD-10-CM | POA: Diagnosis not present

## 2018-01-04 DIAGNOSIS — S83271A Complex tear of lateral meniscus, current injury, right knee, initial encounter: Secondary | ICD-10-CM | POA: Diagnosis not present

## 2018-01-04 DIAGNOSIS — Z833 Family history of diabetes mellitus: Secondary | ICD-10-CM | POA: Diagnosis not present

## 2018-01-04 DIAGNOSIS — Z9884 Bariatric surgery status: Secondary | ICD-10-CM | POA: Diagnosis not present

## 2018-01-04 DIAGNOSIS — Z8249 Family history of ischemic heart disease and other diseases of the circulatory system: Secondary | ICD-10-CM | POA: Diagnosis not present

## 2018-01-04 DIAGNOSIS — Z823 Family history of stroke: Secondary | ICD-10-CM | POA: Diagnosis not present

## 2018-01-04 DIAGNOSIS — S83281A Other tear of lateral meniscus, current injury, right knee, initial encounter: Secondary | ICD-10-CM | POA: Diagnosis not present

## 2018-01-04 DIAGNOSIS — I10 Essential (primary) hypertension: Secondary | ICD-10-CM | POA: Diagnosis not present

## 2018-01-04 DIAGNOSIS — M21861 Other specified acquired deformities of right lower leg: Secondary | ICD-10-CM | POA: Diagnosis not present

## 2018-01-04 DIAGNOSIS — M069 Rheumatoid arthritis, unspecified: Secondary | ICD-10-CM | POA: Diagnosis not present

## 2018-01-04 DIAGNOSIS — Z8489 Family history of other specified conditions: Secondary | ICD-10-CM | POA: Diagnosis not present

## 2018-01-04 DIAGNOSIS — M94261 Chondromalacia, right knee: Secondary | ICD-10-CM | POA: Diagnosis not present

## 2018-01-04 DIAGNOSIS — Z86711 Personal history of pulmonary embolism: Secondary | ICD-10-CM | POA: Diagnosis not present

## 2018-01-30 DIAGNOSIS — M797 Fibromyalgia: Secondary | ICD-10-CM | POA: Diagnosis not present

## 2018-01-30 DIAGNOSIS — M069 Rheumatoid arthritis, unspecified: Secondary | ICD-10-CM | POA: Diagnosis not present

## 2018-01-30 DIAGNOSIS — Z79899 Other long term (current) drug therapy: Secondary | ICD-10-CM | POA: Diagnosis not present

## 2018-01-30 DIAGNOSIS — M1990 Unspecified osteoarthritis, unspecified site: Secondary | ICD-10-CM | POA: Diagnosis not present

## 2018-01-30 DIAGNOSIS — I10 Essential (primary) hypertension: Secondary | ICD-10-CM | POA: Diagnosis not present

## 2018-02-06 DIAGNOSIS — M1711 Unilateral primary osteoarthritis, right knee: Secondary | ICD-10-CM | POA: Diagnosis not present

## 2018-02-17 IMAGING — MR MR HEAD W/O CM
8 of 11 series · 32 of 48 positions shown · non-contrast
Comparison: None.

CLINICAL DATA: Dizziness and headache for 6 months. No known
injury.

EXAM:
MRI HEAD WITHOUT CONTRAST
TECHNIQUE: Multiplanar, multiecho pulse sequences of the brain and surrounding
structures were obtained without intravenous contrast.

[Series 3: T1 · sagittal · 5.0mm · 0.43mm/px · 2 of 21 slices shown]
[im 1/21]
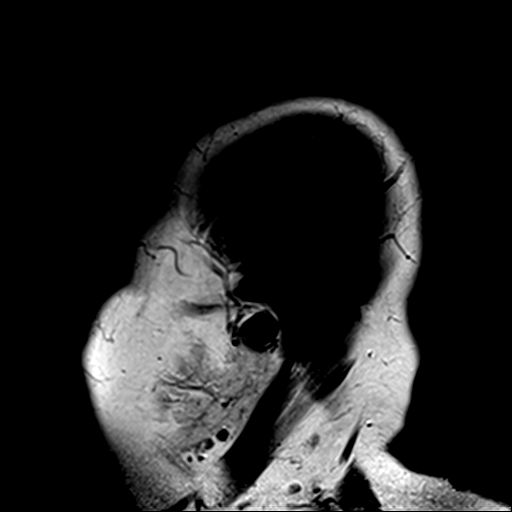
[im 11/21]
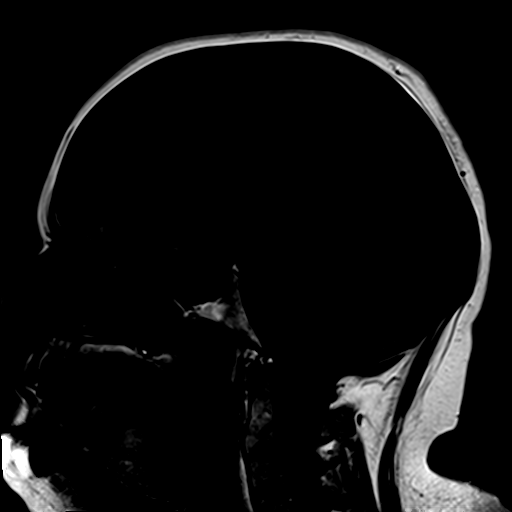

[Series 5: DWI · axial · 3.0mm · 0.82mm/px · z∈[-48,+114]mm · 6 of 55 slices shown (1 of 4)]
[im 1/55]
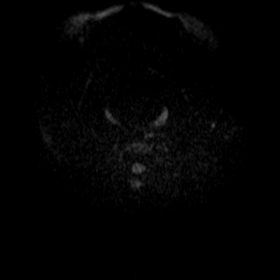
[im 11/55]
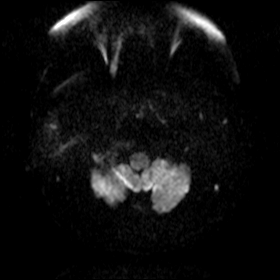
[im 22/55]
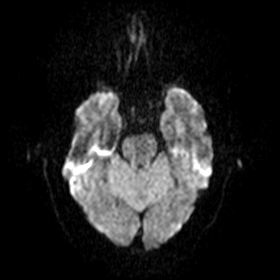
[im 33/55]
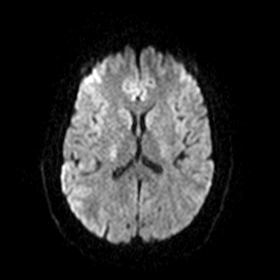
[im 44/55]
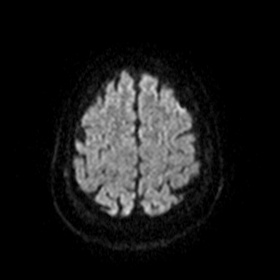
[im 55/55]
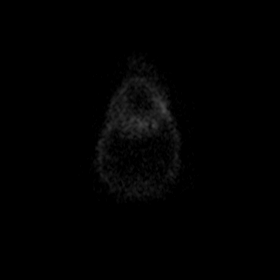

[Series 6: DWI · axial · 3.0mm · 0.82mm/px · z∈[-48,+114]mm · 6 of 55 slices shown (2 of 4)]
[im 1/55]
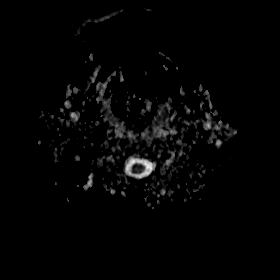
[im 11/55]
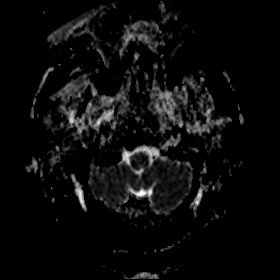
[im 22/55]
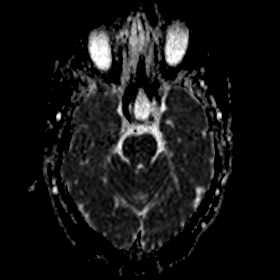
[im 33/55]
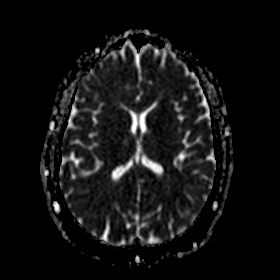
[im 44/55]
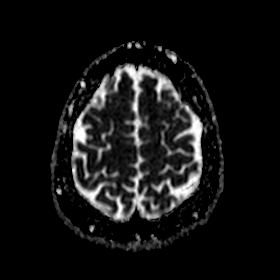
[im 55/55]
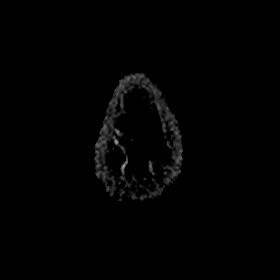

[Series 7: DWI · coronal · 5.0mm · 0.51mm/px · 4 of 36 slices shown (3 of 4)]
[im 1/36]
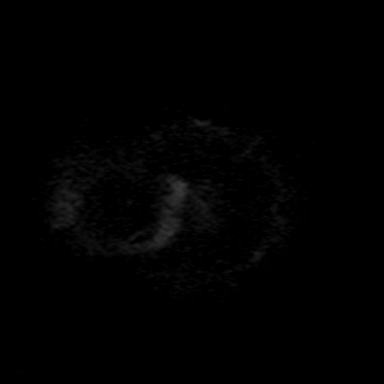
[im 12/36]
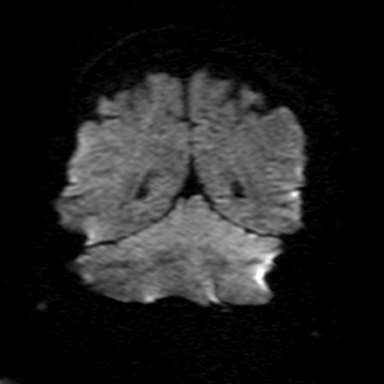
[im 24/36]
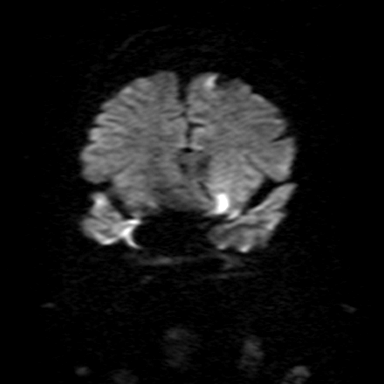
[im 36/36]
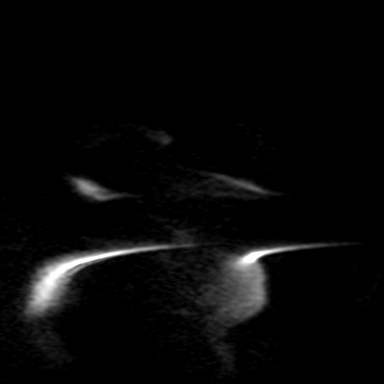

[Series 8: DWI · coronal · 5.0mm · 0.51mm/px · 4 of 36 slices shown (4 of 4)]
[im 1/36]
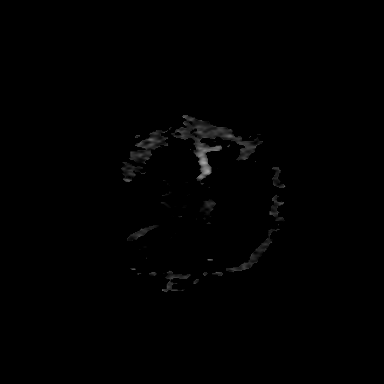
[im 12/36]
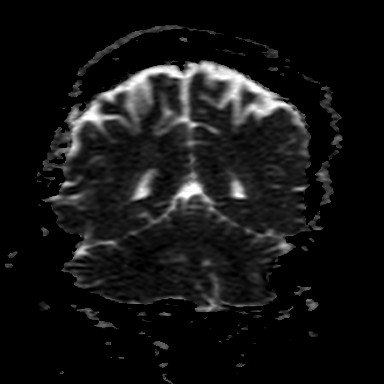
[im 24/36]
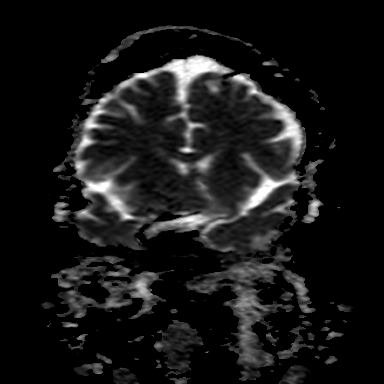
[im 36/36]
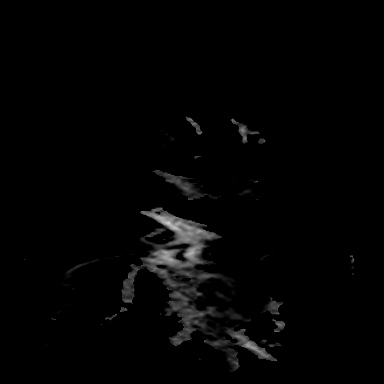

[Series 9: T2 · axial · 5.0mm · 0.60mm/px · z∈[-38,+105]mm · 2 of 23 slices shown (1 of 2)]
[im 1/23]
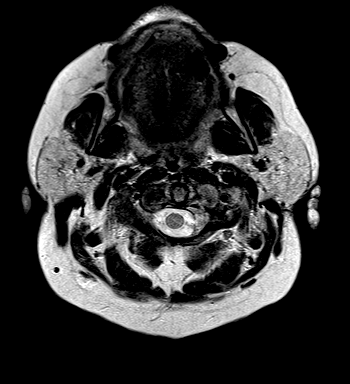
[im 23/23]
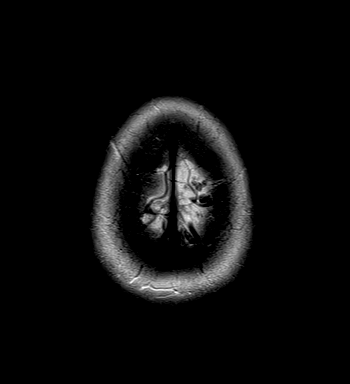

[Series 10: FLAIR · axial · 3.0mm · 0.36mm/px · z∈[-36,+102]mm · 5 of 47 slices shown]
[im 1/47]
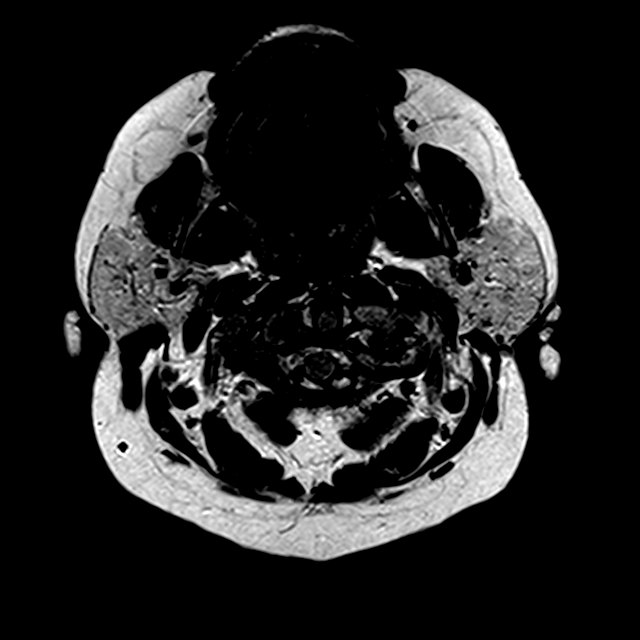
[im 12/47]
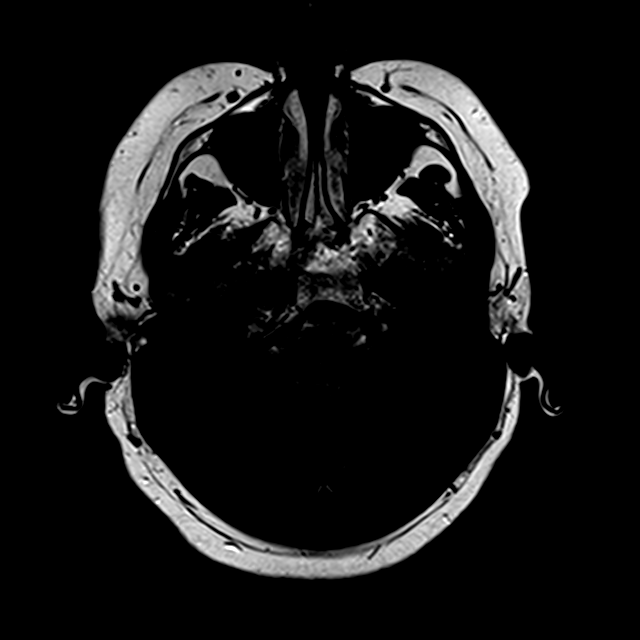
[im 24/47]
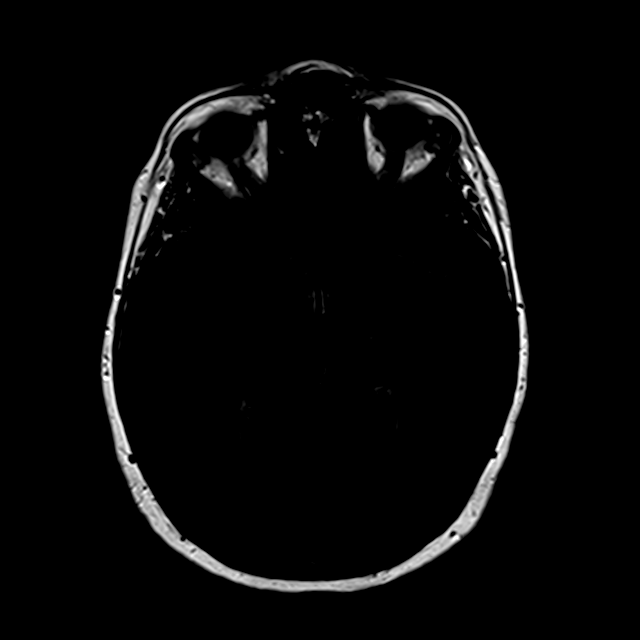
[im 35/47]
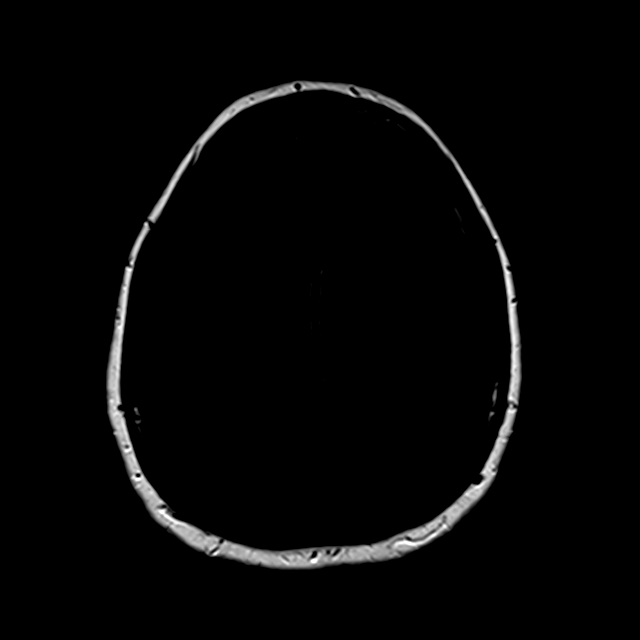
[im 47/47]
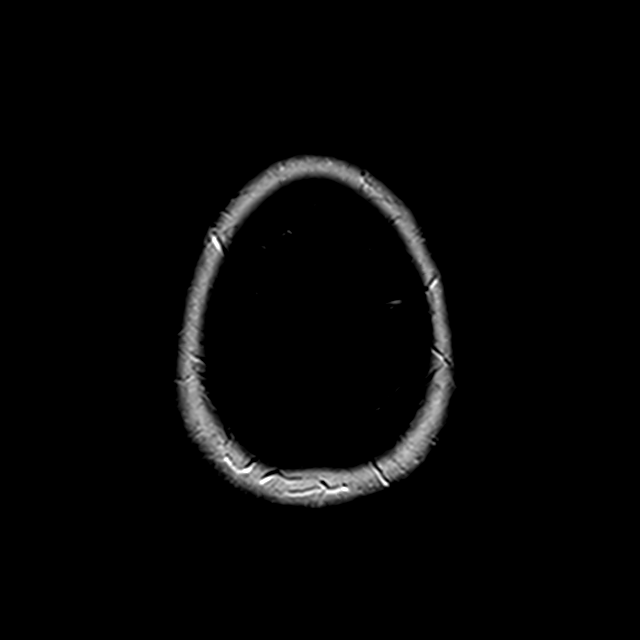

[Series 13: T2 · coronal · 5.0mm · 0.61mm/px · 3 of 28 slices shown (2 of 2)]
[im 1/28]
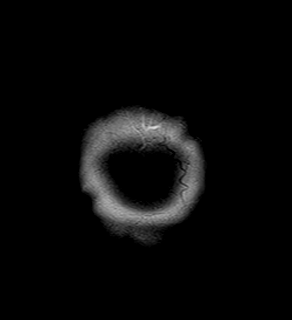
[im 14/28]
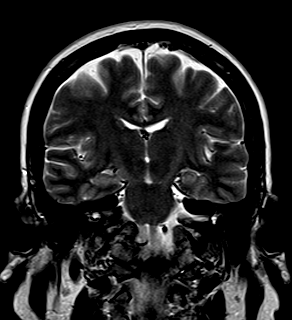
[im 28/28]
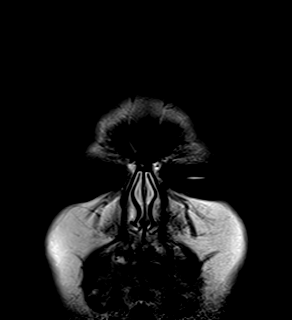

[32 of 48 positions shown; findings below may reference images not displayed]

FINDINGS: Brain: No evidence for acute infarction, hemorrhage, mass lesion,
hydrocephalus, or extra-axial fluid. Normal for age cerebral volume.
No significant white matter disease.

Vascular: Flow voids are maintained throughout the carotid, basilar,
and vertebral arteries. There are no areas of chronic hemorrhage.

Skull and upper cervical spine: Unremarkable visualized calvarium,
skullbase, and cervical vertebrae. Pineal and cerebellar tonsils
unremarkable. Empty sella with expansion. The gland is flattened. No
upper cervical cord lesions.

Sinuses/Orbits: Negative.

Other: None.
IMPRESSION: Empty sella with expansion. This finding is nonspecific, but can be
associated with headaches.

No acute intracranial abnormality.

## 2018-02-21 NOTE — Progress Notes (Signed)
Office Visit Note  Patient: Amanda Hammond             Date of Birth: Jun 05, 1970           MRN: [id number]             PCP: Kela Millin, MD Referring: Louie Boston., MD Visit Date: 03/07/2018 Occupation: @GUAROCC @  Subjective:  Neck pain.   History of Present Illness: Amanda Hammond is a 47 y.o. female with history of fibromyalgia osteoarthritis and degenerative disc disease.  She states she had a right knee joint meniscal tear repair in [facility] by Dr. Case on January 05, 2018.  She still continues to have pain and discomfort in her right knee.  She states she continues to have swelling.  She is been having a lot of discomfort in her neck and left trapezius area.  She also has some lower back pain and hip joint pain.  Which she describes over the trochanteric bursa on both sides.  She still has discomfort in her right carpal tunnel.  Activities of Daily Living:  Patient reports morning stiffness for 1 hour.   Patient Reports nocturnal pain.  Difficulty dressing/grooming: Denies Difficulty climbing stairs: Reports Difficulty getting out of chair: Reports Difficulty using hands for taps, buttons, cutlery, and/or writing: Reports  Review of Systems  Constitutional: Positive for fatigue.  HENT: Positive for mouth dryness. Negative for mouth sores, trouble swallowing and trouble swallowing.        Nose sores  Eyes: Negative for pain, redness, itching and dryness.  Respiratory: Negative for shortness of breath, wheezing and difficulty breathing.   Cardiovascular: Negative for chest pain, palpitations and swelling in legs/feet.  Gastrointestinal: Negative for abdominal pain, blood in stool, constipation, diarrhea, nausea and vomiting.  Endocrine: Negative for increased urination.  Genitourinary: Negative for painful urination, nocturia and pelvic pain.  Musculoskeletal: Positive for arthralgias, joint pain, joint swelling and morning stiffness.  Skin: Negative for rash and hair  loss.  Allergic/Immunologic: Negative for susceptible to infections.  Neurological: Negative for dizziness, light-headedness, headaches, memory loss and weakness.  Hematological: Negative for bruising/bleeding tendency.  Psychiatric/Behavioral: Positive for sleep disturbance. Negative for confusion.    PMFS History:  Patient Active Problem List   Diagnosis Date Noted  . Rheumatoid factor positive 11/06/2016  . Trochanteric bursitis of both hips 11/06/2016  . History of fibromyalgia 11/06/2016  . DDD (degenerative disc disease), cervical 11/06/2016  . Carpal tunnel syndrome, left upper limb 11/06/2016  . Carpal tunnel syndrome, right upper limb 11/06/2016  . Trigger finger, left middle finger 11/06/2016  . Major depressive disorder, recurrent episode, moderate (HCC) 04/27/2016  . Pain in left hand 04/25/2016  . Pain in right hand 04/25/2016  . Primary osteoarthritis of both hands 04/25/2016  . Primary osteoarthritis of both knees 04/25/2016  . Primary osteoarthritis of both feet 04/25/2016  . Fibromyalgia 02/17/2016  . Other insomnia 02/17/2016  . Fatigue 02/17/2016  . B12 DEFICIENCY 02/01/2010  . Vitamin D deficiency 02/01/2010  . ANEMIA, IRON DEFICIENCY 12/30/2009  . OTHER DYSPNEA AND RESPIRATORY ABNORMALITIES 12/30/2009  . Nonspecific (abnormal) findings on radiological and other examination of body structure 12/30/2009  . COMPUTERIZED TOMOGRAPHY, CHEST, ABNORMAL 12/30/2009    Past Medical History:  Diagnosis Date  . Anemia   . Fibromyositis   . Hypertension   . Osteoarthritis   . PONV (postoperative nausea and vomiting)   . Vitamin D deficiency     Family History  Problem Relation Age of  Onset  . Diabetes Mother   . Hypertension Mother   . Hypertension Father   . Hypertension Brother   . Osteoarthritis Brother   . Hypertension Brother   . Osteoarthritis Brother   . Endometriosis Daughter   . Anesthesia problems Neg Hx   . Hypotension Neg Hx   . Pseudochol  deficiency Neg Hx   . Malignant hyperthermia Neg Hx    Past Surgical History:  Procedure Laterality Date  . APPENDECTOMY  2010   [facility]  . CARPAL TUNNEL RELEASE Left   . CHOLECYSTECTOMY  1999   lap, [facility]  . GASTRIC BYPASS  2003   [facility]  . HERNIA REPAIR  08/2010, 02/2010    incisional, [facility], [facility]  . INCISIONAL HERNIA REPAIR  10/12/2010   Procedure: HERNIA REPAIR INCISIONAL;  Surgeon: Dalia Heading;  Location: [facility];  Service: General;  Laterality: N/A;  Recurrent Incisional Hernia Repair with Mesh  . KNEE SURGERY Right 01/2018   meniscal tear   . PANNICULECTOMY  2008   [facility]  . TENNIS ELBOW RELEASE/NIRSCHEL PROCEDURE Bilateral    Social History   Social History Narrative  . Not on file    Objective: Vital Signs: BP (!) 151/95 (BP Location: Left Arm, Patient Position: Sitting, Cuff Size: Normal)   Pulse (!) 125   Resp 16   Ht 5\' 6"  (1.676 m)   Wt 265 lb (120.2 kg)   BMI 42.77 kg/m    Physical Exam  Constitutional: She is oriented to person, place, and time. She appears well-developed and well-nourished.  HENT:  Head: Normocephalic and atraumatic.  Eyes: Conjunctivae and EOM are normal.  Neck: Normal range of motion.  Cardiovascular: Normal rate, regular rhythm, normal heart sounds and intact distal pulses.  Pulmonary/Chest: Effort normal and breath sounds normal.  Abdominal: Soft. Bowel sounds are normal.  Lymphadenopathy:    She has no cervical adenopathy.  Neurological: She is alert and oriented to person, place, and time.  Skin: Skin is warm and dry. Capillary refill takes less than 2 seconds.  Psychiatric: She has a normal mood and affect. Her behavior is normal.  Nursing note and vitals reviewed.    Musculoskeletal Exam: C-spine limited range of motion with discomfort.  She had bilateral trapezius spasm.  Shoulder joints elbow joints wrist joints were in good range of motion.  She has DIP and PIP thickening in her hands.  She is crepitus in her knee joints.  She  has warmth and swelling in her right knee joint with moderate effusion.  She has tenderness over left trochanteric bursa.  She has some generalized hyperalgesia.   CDAI Exam: CDAI Score: Not documented Patient Global Assessment: Not documented; Provider Global Assessment: Not documented Swollen: Not documented; Tender: Not documented Joint Exam   Not documented   There is currently no information documented on the homunculus. Go to the Rheumatology activity and complete the homunculus joint exam.  Investigation: No additional findings.  Imaging: No results found.  Recent Labs: Lab Results  Component Value Date   WBC 8.3 03/21/2017   HGB 10.9 (L) 03/21/2017   PLT 328 03/21/2017   NA 139 03/21/2017   K 3.7 03/21/2017   CL 103 03/21/2017   CO2 29 03/21/2017   GLUCOSE 112 (H) 03/21/2017   BUN 6 (L) 03/21/2017   CREATININE 0.67 03/21/2017   BILITOT 0.4 03/21/2017   ALKPHOS 84 12/05/2009   AST 11 03/21/2017   ALT 9 03/21/2017   PROT 6.4 03/21/2017   ALBUMIN  3.1 (L) 12/05/2009   CALCIUM 9.0 03/21/2017   GFRAA 123 03/21/2017    Speciality Comments: No specialty comments available.  Procedures:  Trigger Point Inj Date/Time: 03/07/2018 2:59 PM Performed by: Pollyann Savoy, MD Authorized by: Pollyann Savoy, MD   Consent Given by:  Patient Site marked: the procedure site was marked   Timeout: prior to procedure the correct patient, procedure, and site was verified   Indications:  Muscle spasm and pain Total # of Trigger Points:  2 Location: neck   Needle Size:  27 G Approach:  Dorsal Medications #1:  0.5 mL lidocaine 1 %; 10 mg triamcinolone acetonide 40 MG/ML Medications #2:  0.5 mL lidocaine 1 %; 10 mg triamcinolone acetonide 40 MG/ML Patient tolerance:  Patient tolerated the procedure well with no immediate complications   Allergies: Patient has no known allergies.   Assessment / Plan:     Visit Diagnoses: Fibromyalgia -she continues to have some  generalized pain and discomfort.  She has been taking Cymbalta 60 mg, Lyrica 100 mg 3 times daily and Zanaflex 4 mg as needed for muscle spasms and Nucynta from pain clinic.  Other fatigue-she continues to have fatigue from fibromyalgia.  Other insomnia-she has chronic insomnia.  Good sleep hygiene was discussed.  Neck pain-she has been experiencing increased pain and discomfort in her cervical area and trapezius area.  After informed consent was obtained bilateral trapezius area was injected with cortisone as described above.  She tolerated the procedure well.  DDD (degenerative disc disease), cervical-chronic pain.  Primary osteoarthritis of both hands-joint protection muscle strengthening was discussed.  Primary osteoarthritis of both knees-she had right knee joint arthroscopic surgery in October and 18.  She continues to has warmth swelling and effusion in her right knee.  I have advised her to follow-up with her orthopedic surgeon.  I have advised her icing her knee and using Voltaren gel in the meantime.  Trochanteric bursitis of both hips-she has been having some discomfort in her left trochanteric bursa most likely due to favoring her right knee.  Primary osteoarthritis of both feet-proper fitting shoes were discussed.  Carpal tunnel syndrome, right upper limb-she continues to have some paresthesias in her right hand.  Major depressive disorder, recurrent episode, moderate (HCC)  Vitamin D deficiency   Orders: Orders Placed This Encounter  Procedures  . Trigger Point Inj   No orders of the defined types were placed in this encounter.     Follow-Up Instructions: Return in about 6 months (around 09/06/2018) for FMS, OA, DDD.   Pollyann Savoy, MD  Note - This record has been created using Animal nutritionist.  Chart creation errors have been sought, but may not always  have been located. Such creation errors do not reflect on  the standard of medical care.

## 2018-03-07 ENCOUNTER — Ambulatory Visit: Payer: BLUE CROSS/BLUE SHIELD | Admitting: Rheumatology

## 2018-03-07 ENCOUNTER — Ambulatory Visit: Payer: Medicare Other | Admitting: Rheumatology

## 2018-03-07 ENCOUNTER — Encounter: Payer: Self-pay | Admitting: Physician Assistant

## 2018-03-07 VITALS — BP 151/95 | HR 125 | Resp 16 | Ht 66.0 in | Wt 265.0 lb

## 2018-03-07 DIAGNOSIS — G4709 Other insomnia: Secondary | ICD-10-CM | POA: Diagnosis not present

## 2018-03-07 DIAGNOSIS — M797 Fibromyalgia: Secondary | ICD-10-CM | POA: Diagnosis not present

## 2018-03-07 DIAGNOSIS — F331 Major depressive disorder, recurrent, moderate: Secondary | ICD-10-CM

## 2018-03-07 DIAGNOSIS — R5383 Other fatigue: Secondary | ICD-10-CM

## 2018-03-07 DIAGNOSIS — M5030 Other cervical disc degeneration, unspecified cervical region: Secondary | ICD-10-CM

## 2018-03-07 DIAGNOSIS — M19072 Primary osteoarthritis, left ankle and foot: Secondary | ICD-10-CM

## 2018-03-07 DIAGNOSIS — M19041 Primary osteoarthritis, right hand: Secondary | ICD-10-CM

## 2018-03-07 DIAGNOSIS — M542 Cervicalgia: Secondary | ICD-10-CM

## 2018-03-07 DIAGNOSIS — M19042 Primary osteoarthritis, left hand: Secondary | ICD-10-CM

## 2018-03-07 DIAGNOSIS — E559 Vitamin D deficiency, unspecified: Secondary | ICD-10-CM

## 2018-03-07 DIAGNOSIS — M7061 Trochanteric bursitis, right hip: Secondary | ICD-10-CM

## 2018-03-07 DIAGNOSIS — M7062 Trochanteric bursitis, left hip: Secondary | ICD-10-CM

## 2018-03-07 DIAGNOSIS — M170 Bilateral primary osteoarthritis of knee: Secondary | ICD-10-CM

## 2018-03-07 DIAGNOSIS — M19071 Primary osteoarthritis, right ankle and foot: Secondary | ICD-10-CM

## 2018-03-07 DIAGNOSIS — G5601 Carpal tunnel syndrome, right upper limb: Secondary | ICD-10-CM

## 2018-03-07 MED ORDER — TRIAMCINOLONE ACETONIDE 40 MG/ML IJ SUSP
10.0000 mg | INTRAMUSCULAR | Status: AC | PRN
  Administered 2018-03-07: 10 mg via INTRAMUSCULAR

## 2018-03-07 MED ORDER — LIDOCAINE HCL 1 % IJ SOLN
0.5000 mL | INTRAMUSCULAR | Status: AC | PRN
  Administered 2018-03-07: .5 mL

## 2018-03-12 DIAGNOSIS — S40012A Contusion of left shoulder, initial encounter: Secondary | ICD-10-CM | POA: Diagnosis not present

## 2018-03-12 DIAGNOSIS — Z79899 Other long term (current) drug therapy: Secondary | ICD-10-CM | POA: Diagnosis not present

## 2018-03-12 DIAGNOSIS — M25562 Pain in left knee: Secondary | ICD-10-CM | POA: Diagnosis not present

## 2018-03-12 DIAGNOSIS — Z86711 Personal history of pulmonary embolism: Secondary | ICD-10-CM | POA: Diagnosis not present

## 2018-03-12 DIAGNOSIS — M797 Fibromyalgia: Secondary | ICD-10-CM | POA: Diagnosis not present

## 2018-03-12 DIAGNOSIS — S4992XA Unspecified injury of left shoulder and upper arm, initial encounter: Secondary | ICD-10-CM | POA: Diagnosis not present

## 2018-03-12 DIAGNOSIS — S8001XA Contusion of right knee, initial encounter: Secondary | ICD-10-CM | POA: Diagnosis not present

## 2018-03-12 DIAGNOSIS — M25462 Effusion, left knee: Secondary | ICD-10-CM | POA: Diagnosis not present

## 2018-03-12 DIAGNOSIS — M069 Rheumatoid arthritis, unspecified: Secondary | ICD-10-CM | POA: Diagnosis not present

## 2018-03-12 DIAGNOSIS — W1839XA Other fall on same level, initial encounter: Secondary | ICD-10-CM | POA: Diagnosis not present

## 2018-03-12 DIAGNOSIS — M25512 Pain in left shoulder: Secondary | ICD-10-CM | POA: Diagnosis not present

## 2018-03-12 DIAGNOSIS — I10 Essential (primary) hypertension: Secondary | ICD-10-CM | POA: Diagnosis not present

## 2018-04-05 DIAGNOSIS — W19XXXS Unspecified fall, sequela: Secondary | ICD-10-CM | POA: Diagnosis not present

## 2018-04-05 DIAGNOSIS — M25512 Pain in left shoulder: Secondary | ICD-10-CM | POA: Diagnosis not present

## 2018-04-05 DIAGNOSIS — M1711 Unilateral primary osteoarthritis, right knee: Secondary | ICD-10-CM | POA: Diagnosis not present

## 2018-04-05 DIAGNOSIS — G894 Chronic pain syndrome: Secondary | ICD-10-CM | POA: Diagnosis not present

## 2018-04-05 DIAGNOSIS — M25461 Effusion, right knee: Secondary | ICD-10-CM | POA: Diagnosis not present

## 2018-04-05 DIAGNOSIS — M25561 Pain in right knee: Secondary | ICD-10-CM | POA: Diagnosis not present

## 2018-04-17 DIAGNOSIS — M25561 Pain in right knee: Secondary | ICD-10-CM | POA: Diagnosis not present

## 2018-04-17 DIAGNOSIS — M25461 Effusion, right knee: Secondary | ICD-10-CM | POA: Diagnosis not present

## 2018-04-17 DIAGNOSIS — G8929 Other chronic pain: Secondary | ICD-10-CM | POA: Diagnosis not present

## 2018-04-23 DIAGNOSIS — M25512 Pain in left shoulder: Secondary | ICD-10-CM | POA: Diagnosis not present

## 2018-04-23 DIAGNOSIS — I10 Essential (primary) hypertension: Secondary | ICD-10-CM | POA: Diagnosis not present

## 2018-04-23 DIAGNOSIS — Z86711 Personal history of pulmonary embolism: Secondary | ICD-10-CM | POA: Diagnosis not present

## 2018-04-23 DIAGNOSIS — S43402A Unspecified sprain of left shoulder joint, initial encounter: Secondary | ICD-10-CM | POA: Diagnosis not present

## 2018-04-23 DIAGNOSIS — Z79899 Other long term (current) drug therapy: Secondary | ICD-10-CM | POA: Diagnosis not present

## 2018-04-23 DIAGNOSIS — M069 Rheumatoid arthritis, unspecified: Secondary | ICD-10-CM | POA: Diagnosis not present

## 2018-04-23 DIAGNOSIS — M797 Fibromyalgia: Secondary | ICD-10-CM | POA: Diagnosis not present

## 2018-04-23 DIAGNOSIS — X500XXA Overexertion from strenuous movement or load, initial encounter: Secondary | ICD-10-CM | POA: Diagnosis not present

## 2018-05-01 DIAGNOSIS — M75112 Incomplete rotator cuff tear or rupture of left shoulder, not specified as traumatic: Secondary | ICD-10-CM | POA: Diagnosis not present

## 2018-05-01 DIAGNOSIS — M779 Enthesopathy, unspecified: Secondary | ICD-10-CM | POA: Diagnosis not present

## 2018-05-01 DIAGNOSIS — S43082A Other subluxation of left shoulder joint, initial encounter: Secondary | ICD-10-CM | POA: Diagnosis not present

## 2018-05-01 DIAGNOSIS — S46812A Strain of other muscles, fascia and tendons at shoulder and upper arm level, left arm, initial encounter: Secondary | ICD-10-CM | POA: Diagnosis not present

## 2018-05-10 DIAGNOSIS — M545 Low back pain: Secondary | ICD-10-CM | POA: Diagnosis not present

## 2018-05-10 DIAGNOSIS — M130 Polyarthritis, unspecified: Secondary | ICD-10-CM | POA: Diagnosis not present

## 2018-05-10 DIAGNOSIS — G8929 Other chronic pain: Secondary | ICD-10-CM | POA: Diagnosis not present

## 2018-05-10 DIAGNOSIS — M25569 Pain in unspecified knee: Secondary | ICD-10-CM | POA: Diagnosis not present

## 2018-05-10 DIAGNOSIS — M25559 Pain in unspecified hip: Secondary | ICD-10-CM | POA: Diagnosis not present

## 2018-05-14 DIAGNOSIS — I10 Essential (primary) hypertension: Secondary | ICD-10-CM | POA: Diagnosis not present

## 2018-05-22 DIAGNOSIS — M7989 Other specified soft tissue disorders: Secondary | ICD-10-CM | POA: Diagnosis not present

## 2018-05-22 DIAGNOSIS — S46012D Strain of muscle(s) and tendon(s) of the rotator cuff of left shoulder, subsequent encounter: Secondary | ICD-10-CM | POA: Diagnosis not present

## 2018-05-22 DIAGNOSIS — M79661 Pain in right lower leg: Secondary | ICD-10-CM | POA: Diagnosis not present

## 2018-05-22 DIAGNOSIS — S83271D Complex tear of lateral meniscus, current injury, right knee, subsequent encounter: Secondary | ICD-10-CM | POA: Diagnosis not present

## 2018-05-22 DIAGNOSIS — M25461 Effusion, right knee: Secondary | ICD-10-CM | POA: Diagnosis not present

## 2018-06-05 DIAGNOSIS — M25569 Pain in unspecified knee: Secondary | ICD-10-CM | POA: Diagnosis not present

## 2018-06-05 DIAGNOSIS — M130 Polyarthritis, unspecified: Secondary | ICD-10-CM | POA: Diagnosis not present

## 2018-06-05 DIAGNOSIS — M545 Low back pain: Secondary | ICD-10-CM | POA: Diagnosis not present

## 2018-06-05 DIAGNOSIS — M542 Cervicalgia: Secondary | ICD-10-CM | POA: Diagnosis not present

## 2018-06-05 DIAGNOSIS — M25559 Pain in unspecified hip: Secondary | ICD-10-CM | POA: Diagnosis not present

## 2018-06-07 DIAGNOSIS — M75102 Unspecified rotator cuff tear or rupture of left shoulder, not specified as traumatic: Secondary | ICD-10-CM | POA: Diagnosis not present

## 2018-06-07 DIAGNOSIS — M7542 Impingement syndrome of left shoulder: Secondary | ICD-10-CM | POA: Diagnosis not present

## 2018-06-07 DIAGNOSIS — Z8261 Family history of arthritis: Secondary | ICD-10-CM | POA: Diagnosis not present

## 2018-06-07 DIAGNOSIS — S43082A Other subluxation of left shoulder joint, initial encounter: Secondary | ICD-10-CM | POA: Diagnosis not present

## 2018-06-07 DIAGNOSIS — M25712 Osteophyte, left shoulder: Secondary | ICD-10-CM | POA: Diagnosis not present

## 2018-06-07 DIAGNOSIS — S43006A Unspecified dislocation of unspecified shoulder joint, initial encounter: Secondary | ICD-10-CM | POA: Diagnosis not present

## 2018-06-07 DIAGNOSIS — I10 Essential (primary) hypertension: Secondary | ICD-10-CM | POA: Diagnosis not present

## 2018-06-07 DIAGNOSIS — Z8249 Family history of ischemic heart disease and other diseases of the circulatory system: Secondary | ICD-10-CM | POA: Diagnosis not present

## 2018-06-07 DIAGNOSIS — M25312 Other instability, left shoulder: Secondary | ICD-10-CM | POA: Diagnosis not present

## 2018-06-07 DIAGNOSIS — G43909 Migraine, unspecified, not intractable, without status migrainosus: Secondary | ICD-10-CM | POA: Diagnosis not present

## 2018-06-07 DIAGNOSIS — Z86718 Personal history of other venous thrombosis and embolism: Secondary | ICD-10-CM | POA: Diagnosis not present

## 2018-06-07 DIAGNOSIS — M797 Fibromyalgia: Secondary | ICD-10-CM | POA: Diagnosis not present

## 2018-06-07 DIAGNOSIS — M25812 Other specified joint disorders, left shoulder: Secondary | ICD-10-CM | POA: Diagnosis not present

## 2018-06-07 DIAGNOSIS — Z86711 Personal history of pulmonary embolism: Secondary | ICD-10-CM | POA: Diagnosis not present

## 2018-06-07 DIAGNOSIS — Z9884 Bariatric surgery status: Secondary | ICD-10-CM | POA: Diagnosis not present

## 2018-06-07 DIAGNOSIS — Z833 Family history of diabetes mellitus: Secondary | ICD-10-CM | POA: Diagnosis not present

## 2018-06-07 DIAGNOSIS — S46812A Strain of other muscles, fascia and tendons at shoulder and upper arm level, left arm, initial encounter: Secondary | ICD-10-CM | POA: Diagnosis not present

## 2018-06-07 DIAGNOSIS — Z823 Family history of stroke: Secondary | ICD-10-CM | POA: Diagnosis not present

## 2018-06-17 DIAGNOSIS — S46012D Strain of muscle(s) and tendon(s) of the rotator cuff of left shoulder, subsequent encounter: Secondary | ICD-10-CM | POA: Diagnosis not present

## 2018-07-03 DIAGNOSIS — M545 Low back pain: Secondary | ICD-10-CM | POA: Diagnosis not present

## 2018-07-03 DIAGNOSIS — M25559 Pain in unspecified hip: Secondary | ICD-10-CM | POA: Diagnosis not present

## 2018-07-03 DIAGNOSIS — M25569 Pain in unspecified knee: Secondary | ICD-10-CM | POA: Diagnosis not present

## 2018-07-03 DIAGNOSIS — M130 Polyarthritis, unspecified: Secondary | ICD-10-CM | POA: Diagnosis not present

## 2018-07-31 DIAGNOSIS — M545 Low back pain: Secondary | ICD-10-CM | POA: Diagnosis not present

## 2018-07-31 DIAGNOSIS — M542 Cervicalgia: Secondary | ICD-10-CM | POA: Diagnosis not present

## 2018-07-31 DIAGNOSIS — M25569 Pain in unspecified knee: Secondary | ICD-10-CM | POA: Diagnosis not present

## 2018-07-31 DIAGNOSIS — M25559 Pain in unspecified hip: Secondary | ICD-10-CM | POA: Diagnosis not present

## 2018-07-31 DIAGNOSIS — M130 Polyarthritis, unspecified: Secondary | ICD-10-CM | POA: Diagnosis not present

## 2018-08-19 DIAGNOSIS — S46012D Strain of muscle(s) and tendon(s) of the rotator cuff of left shoulder, subsequent encounter: Secondary | ICD-10-CM | POA: Diagnosis not present

## 2018-08-19 DIAGNOSIS — M25461 Effusion, right knee: Secondary | ICD-10-CM | POA: Diagnosis not present

## 2018-08-20 DIAGNOSIS — M25461 Effusion, right knee: Secondary | ICD-10-CM | POA: Diagnosis not present

## 2018-08-21 DIAGNOSIS — I10 Essential (primary) hypertension: Secondary | ICD-10-CM | POA: Diagnosis not present

## 2018-08-21 DIAGNOSIS — M25561 Pain in right knee: Secondary | ICD-10-CM | POA: Diagnosis not present

## 2018-08-21 DIAGNOSIS — G8929 Other chronic pain: Secondary | ICD-10-CM | POA: Diagnosis not present

## 2018-08-21 DIAGNOSIS — M7121 Synovial cyst of popliteal space [Baker], right knee: Secondary | ICD-10-CM | POA: Diagnosis not present

## 2018-08-22 NOTE — Progress Notes (Deleted)
Office Visit Note  Patient: Amanda Hammond             Date of Birth: [birth year]           MRN: [id number]             PCP: Kela Millin, MD Referring: Kela Millin, MD Visit Date: 09/05/2018 Occupation: @GUAROCC @  Subjective:  No chief complaint on file.   History of Present Illness: Amanda Hammond is a 48 y.o. female ***   Activities of Daily Living:  Patient reports morning stiffness for *** {minute/hour:19697}.   Patient {ACTIONS;DENIES/REPORTS:21021675::"Denies"} nocturnal pain.  Difficulty dressing/grooming: {ACTIONS;DENIES/REPORTS:21021675::"Denies"} Difficulty climbing stairs: {ACTIONS;DENIES/REPORTS:21021675::"Denies"} Difficulty getting out of chair: {ACTIONS;DENIES/REPORTS:21021675::"Denies"} Difficulty using hands for taps, buttons, cutlery, and/or writing: {ACTIONS;DENIES/REPORTS:21021675::"Denies"}  No Rheumatology ROS completed.   PMFS History:  Patient Active Problem List   Diagnosis Date Noted  . Rheumatoid factor positive 11/06/2016  . Trochanteric bursitis of both hips 11/06/2016  . History of fibromyalgia 11/06/2016  . DDD (degenerative disc disease), cervical 11/06/2016  . Carpal tunnel syndrome, left upper limb 11/06/2016  . Carpal tunnel syndrome, right upper limb 11/06/2016  . Trigger finger, left middle finger 11/06/2016  . Major depressive disorder, recurrent episode, moderate (HCC) 04/27/2016  . Pain in left hand 04/25/2016  . Pain in right hand 04/25/2016  . Primary osteoarthritis of both hands 04/25/2016  . Primary osteoarthritis of both knees 04/25/2016  . Primary osteoarthritis of both feet 04/25/2016  . Fibromyalgia 02/17/2016  . Other insomnia 02/17/2016  . Fatigue 02/17/2016  . B12 DEFICIENCY 02/01/2010  . Vitamin D deficiency 02/01/2010  . ANEMIA, IRON DEFICIENCY 12/30/2009  . OTHER DYSPNEA AND RESPIRATORY ABNORMALITIES 12/30/2009  . Nonspecific (abnormal) findings on radiological and other examination of body  structure 12/30/2009  . COMPUTERIZED TOMOGRAPHY, CHEST, ABNORMAL 12/30/2009    Past Medical History:  Diagnosis Date  . Anemia   . Fibromyositis   . Hypertension   . Osteoarthritis   . PONV (postoperative nausea and vomiting)   . Vitamin D deficiency     Family History  Problem Relation Age of Onset  . Diabetes Mother   . Hypertension Mother   . Hypertension Father   . Hypertension Brother   . Osteoarthritis Brother   . Hypertension Brother   . Osteoarthritis Brother   . Endometriosis Daughter   . Anesthesia problems Neg Hx   . Hypotension Neg Hx   . Pseudochol deficiency Neg Hx   . Malignant hyperthermia Neg Hx    Past Surgical History:  Procedure Laterality Date  . APPENDECTOMY  2010   [facility]  . CARPAL TUNNEL RELEASE Left   . CHOLECYSTECTOMY  1999   lap, [facility]  . GASTRIC BYPASS  2003   [facility]  . HERNIA REPAIR  08/2010, 02/2010    incisional, [facility], [facility]  . INCISIONAL HERNIA REPAIR  10/12/2010   Procedure: HERNIA REPAIR INCISIONAL;  Surgeon: Dalia Heading;  Location: [facility];  Service: General;  Laterality: N/A;  Recurrent Incisional Hernia Repair with Mesh  . KNEE SURGERY Right 01/2018   meniscal tear   . PANNICULECTOMY  2008   [facility]  . TENNIS ELBOW RELEASE/NIRSCHEL PROCEDURE Bilateral    Social History   Social History Narrative  . Not on file    There is no immunization history on file for this patient.   Objective: Vital Signs: There were no vitals taken for this visit.   Physical Exam   Musculoskeletal Exam: ***  CDAI Exam: CDAI Score:  Not documented Patient Global Assessment: Not documented; Provider Global Assessment: Not documented Swollen: Not documented; Tender: Not documented Joint Exam   Not documented   There is currently no information documented on the homunculus. Go to the Rheumatology activity and complete the homunculus joint exam.  Investigation: No additional findings.  Imaging: No results found.  Recent Labs: Lab Results   Component Value Date   WBC 8.3 03/21/2017   HGB 10.9 (L) 03/21/2017   PLT 328 03/21/2017   NA 139 03/21/2017   K 3.7 03/21/2017   CL 103 03/21/2017   CO2 29 03/21/2017   GLUCOSE 112 (H) 03/21/2017   BUN 6 (L) 03/21/2017   CREATININE 0.67 03/21/2017   BILITOT 0.4 03/21/2017   ALKPHOS 84 12/05/2009   AST 11 03/21/2017   ALT 9 03/21/2017   PROT 6.4 03/21/2017   ALBUMIN 3.1 (L) 12/05/2009   CALCIUM 9.0 03/21/2017   GFRAA 123 03/21/2017    Speciality Comments: No specialty comments available.  Procedures:  No procedures performed Allergies: Patient has no known allergies.   Assessment / Plan:     Visit Diagnoses: No diagnosis found.   Orders: No orders of the defined types were placed in this encounter.  No orders of the defined types were placed in this encounter.   Face-to-face time spent with patient was *** minutes. Greater than 50% of time was spent in counseling and coordination of care.  Follow-Up Instructions: No follow-ups on file.   Gearldine Bienenstock, PA-C  Note - This record has been created using Dragon software.  Chart creation errors have been sought, but may not always  have been located. Such creation errors do not reflect on  the standard of medical care.

## 2018-09-02 DIAGNOSIS — M25561 Pain in right knee: Secondary | ICD-10-CM | POA: Diagnosis not present

## 2018-09-02 DIAGNOSIS — M25461 Effusion, right knee: Secondary | ICD-10-CM | POA: Diagnosis not present

## 2018-09-02 DIAGNOSIS — G8929 Other chronic pain: Secondary | ICD-10-CM | POA: Diagnosis not present

## 2018-09-02 DIAGNOSIS — S83271S Complex tear of lateral meniscus, current injury, right knee, sequela: Secondary | ICD-10-CM | POA: Diagnosis not present

## 2018-09-05 ENCOUNTER — Ambulatory Visit: Payer: Self-pay | Admitting: Physician Assistant

## 2018-09-16 DIAGNOSIS — M25461 Effusion, right knee: Secondary | ICD-10-CM | POA: Diagnosis not present

## 2018-09-25 DIAGNOSIS — M25569 Pain in unspecified knee: Secondary | ICD-10-CM | POA: Diagnosis not present

## 2018-09-25 DIAGNOSIS — M25559 Pain in unspecified hip: Secondary | ICD-10-CM | POA: Diagnosis not present

## 2018-09-25 DIAGNOSIS — M25512 Pain in left shoulder: Secondary | ICD-10-CM | POA: Diagnosis not present

## 2018-09-25 DIAGNOSIS — M545 Low back pain: Secondary | ICD-10-CM | POA: Diagnosis not present

## 2018-10-07 DIAGNOSIS — R600 Localized edema: Secondary | ICD-10-CM | POA: Diagnosis not present

## 2018-10-07 DIAGNOSIS — I10 Essential (primary) hypertension: Secondary | ICD-10-CM | POA: Diagnosis not present

## 2018-10-14 ENCOUNTER — Telehealth: Payer: Self-pay | Admitting: Rheumatology

## 2018-10-14 NOTE — Telephone Encounter (Signed)
Patient called to schedule a rov. Patient was scheduled for 10/23/2018. Patient stating she has been experiencing a lot of swelling in her legs that stays swollen 24/7. Also, having swelling in hands, and pain with both hands and legs. Please call to advise.

## 2018-10-15 NOTE — Progress Notes (Signed)
Office Visit Note  Patient: Amanda Hammond             Date of Birth: 1970-06-20           MRN: [id number]             PCP: Sandi Mealy, MD Referring: Sandi Mealy, MD Visit Date: 10/16/2018 Occupation: _0 @  Subjective:  Generalized pain   History of Present Illness: Amanda Hammond is a 48 y.o. female with history of fibromyalgia, osteoarthritis, and DDD.  She takes Cymbalta 60 mg in the morning and 30 mg at night.  She continues to see her pain management specialist.  She was recently evaluated and switched from Ensign to oxycodone and has noticed some improvement in her level of pain.  She also increase her dose of Lyrica to 200 mg 3 times daily.  She was started on this new regimen at the beginning of July.  She is currently having a fibromyalgia flare.  He has been flaring more frequently.  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  She is having generalized muscle aches and muscle tenderness.  She has difficulty sleeping at night due to discomfort she experiences.  She has been sleeping during the day which is made it difficult to fall asleep at night.  Her level of fatigue has been worsening.  She has been unable to exercise due to discomfort.  She is having severe right knee joint pain.  She had a right knee arthroscopic surgery performed by Dr. Case in October 2019.  She reports she fell in January 202020 and landed on her right knee joint and has had discomfort since.  She has noticed swelling and warmth in the right knee joint.  Patient states that she has been having worsening pedal edema over the past several months and has been evaluated by her orthopedist as well as PCP.  She was referred to vascular specialist for has not been evaluated yet.  Patient states she is been having increased pain in bilateral hands the past 2 to 3 months.  She states that her right second MCP joint swells intermittently.   Activities of Daily Living:   Patient reports morning stiffness for 1 hour.   Patient Denies nocturnal pain.  Difficulty dressing/grooming: Reports Difficulty climbing stairs: Reports Difficulty getting out of chair: Reports Difficulty using hands for taps, buttons, cutlery, and/or writing: Reports  Review of Systems  Constitutional: Positive for fatigue.  HENT: Positive for mouth dryness. Negative for mouth sores and nose dryness.   Eyes: Negative for pain, visual disturbance and dryness.  Respiratory: Negative for cough, hemoptysis, shortness of breath and difficulty breathing.   Cardiovascular: Positive for swelling in legs/feet. Negative for chest pain, palpitations and hypertension.  Gastrointestinal: Negative for blood in stool, constipation and diarrhea.  Genitourinary: Negative for difficulty urinating and painful urination.  Musculoskeletal: Positive for arthralgias, joint pain, joint swelling, muscle weakness, morning stiffness and muscle tenderness. Negative for myalgias and myalgias.  Skin: Negative for color change, pallor, rash, hair loss, nodules/bumps, skin tightness, ulcers and sensitivity to sunlight.  Allergic/Immunologic: Negative for susceptible to infections.  Neurological: Negative for dizziness and headaches.  Hematological: Negative for bruising/bleeding tendency and swollen glands.  Psychiatric/Behavioral: Positive for sleep disturbance. Negative for depressed mood. The patient is not nervous/anxious.     PMFS History:  Patient Active Problem List   Diagnosis Date Noted   Rheumatoid factor positive 11/06/2016   Trochanteric bursitis  of both hips 11/06/2016   History of fibromyalgia 11/06/2016   DDD (degenerative disc disease), cervical 11/06/2016   Carpal tunnel syndrome, left upper limb 11/06/2016   Carpal tunnel syndrome, right upper limb 11/06/2016   Trigger finger, left middle finger 11/06/2016   Major depressive disorder, recurrent episode, moderate (HCC) 04/27/2016    Pain in left hand 04/25/2016   Pain in right hand 04/25/2016   Primary osteoarthritis of both hands 04/25/2016   Primary osteoarthritis of both knees 04/25/2016   Primary osteoarthritis of both feet 04/25/2016   Fibromyalgia 02/17/2016   Other insomnia 02/17/2016   Fatigue 02/17/2016   B12 DEFICIENCY 02/01/2010   Vitamin D deficiency 02/01/2010   ANEMIA, IRON DEFICIENCY 12/30/2009   OTHER DYSPNEA AND RESPIRATORY ABNORMALITIES 12/30/2009   Nonspecific (abnormal) findings on radiological and other examination of body structure 12/30/2009   COMPUTERIZED TOMOGRAPHY, CHEST, ABNORMAL 12/30/2009    Past Medical History:  Diagnosis Date   Anemia    Fibromyositis    Hypertension    Osteoarthritis    PONV (postoperative nausea and vomiting)    Vitamin D deficiency     Family History  Problem Relation Age of Onset   Diabetes Mother    Hypertension Mother    Hypertension Father    Hypertension Brother    Osteoarthritis Brother    Hypertension Brother    Osteoarthritis Brother    Endometriosis Daughter    Anesthesia problems Neg Hx    Hypotension Neg Hx    Pseudochol deficiency Neg Hx    Malignant hyperthermia Neg Hx    Past Surgical History:  Procedure Laterality Date   APPENDECTOMY  2010   [place]   CARPAL TUNNEL RELEASE Left    CHOLECYSTECTOMY  1999   lap, [place]   GASTRIC BYPASS  2003   [facility]   HERNIA REPAIR  08/2010, 02/2010    incisional, [facility], [place]   INCISIONAL HERNIA REPAIR  10/12/2010   Procedure: HERNIA REPAIR INCISIONAL;  Surgeon: Jamesetta So;  Location: [facility];  Service: General;  Laterality: N/A;  Recurrent Incisional Hernia Repair with Mesh   KNEE SURGERY Right 01/2018   meniscal tear    PANNICULECTOMY  2008   [place]     TENNIS ELBOW RELEASE/NIRSCHEL PROCEDURE Bilateral    Social History   Social History Narrative   Not on file    There is no immunization history on file for this patient.    Objective: Vital Signs: BP 135/88 (BP Location: Right Arm, Patient Position: Sitting, Cuff Size: Normal)    Pulse 89    Resp 18    Ht _0  (1.676 m)    Wt 260 lb 12.8 oz (118.3 kg)    BMI 42.09 kg/m    Physical Exam Vitals signs and nursing note reviewed.  Constitutional:      Appearance: She is well-developed.  HENT:     Head: Normocephalic and atraumatic.  Eyes:     Conjunctiva/sclera: Conjunctivae normal.  Neck:     Musculoskeletal: Normal range of motion.  Cardiovascular:     Rate and Rhythm: Normal rate and regular rhythm.     Heart sounds: Normal heart sounds.  Pulmonary:     Effort: Pulmonary effort is normal.     Breath sounds: Normal breath sounds.  Abdominal:     General: Bowel sounds are normal.     Palpations: Abdomen is soft.  Lymphadenopathy:     Cervical: No cervical adenopathy.  Skin:  General: Skin is warm and dry.     Capillary Refill: Capillary refill takes less than 2 seconds.  Neurological:     Mental Status: She is alert and oriented to person, place, and time.  Psychiatric:        Behavior: Behavior normal.      Musculoskeletal Exam: Generalized hyperalgesia and positive tender points. Shoulder joints, elbow joints, wrist joints MCPs, PIPs,DIPs good ROM with no synovitis.  She has tenderness of the right 2nd MCP and left 2nd and 3rd MCP joints. Hip joints, ankle joints, MTPs, PIPs, and DIPs good ROM with no synovitis.   Right knee painful and limited ROM.  Right knee warmth and inflammation noted.  Baker's cyst palpable.  Moderate to severe right knee joint effusion noted. Left knee has good ROM with no warmth or effusion. No tenderness or swelling of ankle joints.  Pedal edema bilaterally.    CDAI Exam: CDAI Score: -- Patient Global: --; Provider Global: -- Swollen: 0 ; Tender: 3  Joint Exam      Right  Left  MCP 2   Tender   Tender  MCP 3      Tender     Investigation: No additional findings.  Imaging: Xr Hand 2 View Left  Result  Date: 10/16/2018 PIP and DIP narrowing was noted.  No MCP, intercarpal or radiocarpal joint space narrowing was noted.  No erosive changes were noted. Impression: These findings are consistent with osteoarthritis of the hand.  Xr Hand 2 View Right  Result Date: 10/16/2018 PIP and DIP narrowing was noted.  No MCP, intercarpal or radiocarpal joint space narrowing was noted.  No erosive changes were noted. Impression: These findings are consistent with osteoarthritis of the hand.   Recent Labs: Lab Results  Component Value Date   WBC 8.3 03/21/2017   HGB 10.9 (L) 03/21/2017   PLT 328 03/21/2017   NA 139 03/21/2017   K 3.7 03/21/2017   CL 103 03/21/2017   CO2 29 03/21/2017   GLUCOSE 112 (H) 03/21/2017   BUN 6 (L) 03/21/2017   CREATININE 0.67 03/21/2017   BILITOT 0.4 03/21/2017   ALKPHOS 84 12/05/2009   AST 11 03/21/2017   ALT 9 03/21/2017   PROT 6.4 03/21/2017   ALBUMIN 3.1 (L) 12/05/2009   CALCIUM 9.0 03/21/2017   GFRAA 123 03/21/2017    Speciality Comments: No specialty comments available.  Procedures:  Large Joint Inj: R knee on 10/16/2018 11:58 AM Indications: pain Details: 27 G 1.5 in needle, medial approach  Arthrogram: No  Medications: 3 mL lidocaine 1 % Aspirate: 14 mL; sent for lab analysis Outcome: tolerated well, no immediate complications Procedure, treatment alternatives, risks and benefits explained, specific risks discussed. Consent was given by the patient. Immediately prior to procedure a time out was called to verify the correct patient, procedure, equipment, support staff and site/side marked as required. Patient was prepped and draped in the usual sterile fashion.     Allergies: Patient has no known allergies.   Assessment / Plan:     Visit Diagnoses: Fibromyalgia -She has generalized hyperalgesia and positive tender points on exam.  She is currently having a fibromyalgia flare.  She has been having more frequent and severe fibromyalgia flares recently.   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  She is been having difficulty sleeping at night due to the discomfort she experiences.  Her level of fatigue  has been worsening.  She has been unable to exercise due to the severe pain she is experiencing in her right knee joint.  She continues to see her pain management specialist on a regular basis.  She was recently switched from [place] to oxycodone for pain relief.  She also increase her dose of Lyrica to 200 mg 3 times daily.  She continues to take Cymbalta 60 mg a mouth in the morning 30 mg by mouth in the evening.  Overall she feels like this has been effective combination of medications when she has not flaring.  Other fatigue -Chronic and related to insomnia.  Other insomnia - She has been having difficulty sleeping at night due to discomfort she experiences.  She is taking oxycodone as needed for pain relief.  She has been taking daytime naps to catch up on sleep.  DDD (degenerative disc disease), cervical -She has limited range of motion with discomfort.  She is trapezius muscle tension and muscle tenderness bilaterally.  She has no symptoms of radiculopathy at this time.  Primary osteoarthritis of both hands - She has PIP and DIP synovial thickening consistent with osteoarthritis of bilateral hands.  She is complete fist formation bilaterally.  Joint protection and muscle strengthening were discussed.  Primary osteoarthritis of both knees - She presents today with right knee joint pain and inflammation.  She had a right knee arthroscopy with meniscectomy in October 2019 performed by Dr. Case.  She fell in January 2020 and has been having difficulty with ambulation since. She had a right knee joint aspiration and depo medrol injection on 09/16/18 by Dr. Case.  She has right knee warmth and inflammation on exam today.  She is a Baker's cyst palpable.  She has pain with  flexion and extension.  She has a moderate to large effusion in the right knee joint. A right knee joint aspiration was performed.    Effusion, right knee: She presents today with severe right knee joint pain and a moderate effusion.  We will obtain CCP, RF, sed rate, 14-3-3 eta, and uric acid.  HLA-B27 negative and Ace WNL on 06/22/15.  A right knee joint aspiration was performed and 14 ml of fluid was drawn off and sent for analysis.  She had a cortisone injection on 09/16/18 by Dr. Case.  We will send in a prednisone taper starting at 30 mg and she will taper by 5 mg every 4 days.   Trochanteric bursitis of both hips - She has tenderness bilaterally.   Primary osteoarthritis of both feet -She has no feet pain or joint swelling at this time.   Carpal tunnel syndrome, right upper limb - Resolved  Major depressive disorder, recurrent episode, moderate (HCC) - She is taking Cymbalta 60 mg a mouth in the morning and 30 mg a mouth in the evening.  Pain in both hands - She presents today with increased pain in bilateral hands that started 2 to 3 months ago.  She has tenderness of the right second MCP and left second third MCP joints.no obvious synovitis was noted on exam today.  She has complete fist formation bilaterally.  X-rays of both hands were obtained today and findings were consistent with osteoarthritis.  We will obtain the following lab work including RF, CCP, _0 eta, sed rate, UA uric acid, and ANA.  Plan: XR Hand 2 View Left, XR Hand 2 View Right, 14-3-3 eta Protein, Cyclic citrul peptide antibody, IgG, Rheumatoid factor, Sedimentation rate, Uric acid, ANA  Orders: Orders Placed This Encounter  Procedures   Large Joint Inj: R knee   Anaerobic and Aerobic Culture   XR Hand 2 View Left   XR Hand 2 View Right   14-3-3 eta Protein   Cyclic citrul peptide antibody, IgG   Rheumatoid factor   Sedimentation rate   Uric acid   ANA   Synovial cell count + diff, w/ crystals    No orders of the defined types were placed in this encounter.   Face-to-face time spent with patient was 30  minutes. Greater than 50% of time was spent in counseling and coordination of care.  Follow-Up Instructions: Return in about 3 weeks (around 11/06/2018) for Fibromyalgia, DDD, Osteoarthritis.   Ofilia Neas, PA-C   I examined and evaluated the patient with Hazel Sams PA.  Patient had large knee effusion and Baker's cyst on my examination today.  She has been having difficulty walking.  She had aspiration and cortisone injection about a month ago at [facility].  We decided to aspirate her knee joint and sent the synovial fluid for analysis.  We did not inject with cortisone today as she had recent cortisone injection.  A prednisone taper was given as patient is very uncomfortable.  She has been also having some discomfort in her hands.  No obvious synovitis was noted.  We will obtain autoimmune labs today.  The plan of care was discussed as noted above.  Bo Merino, MD  Note - This record has been created using Editor, commissioning.  Chart creation errors have been sought, but may not always  have been located. Such creation errors do not reflect on  the standard of medical care.

## 2018-10-15 NOTE — Telephone Encounter (Signed)
Attempted to contact patient. Unable to leave a message, voicemail not set up.

## 2018-10-15 NOTE — Telephone Encounter (Signed)
Patient states she is having swelling in her legs on and off for about 6-7 months. Patient states it started it again about 1 weeks ago. Patient states she has seen her PCP and was put on a fluid pill. Patient states that has no improved. Patient states "it feels ike a firey knife touching her leg". Patient has been schedule for a sooner appointment 10/16/18 at 10:15 am.

## 2018-10-16 ENCOUNTER — Other Ambulatory Visit: Payer: Self-pay

## 2018-10-16 ENCOUNTER — Encounter: Payer: Self-pay | Admitting: Rheumatology

## 2018-10-16 ENCOUNTER — Ambulatory Visit: Payer: Medicare Other | Admitting: Rheumatology

## 2018-10-16 ENCOUNTER — Ambulatory Visit: Payer: Self-pay

## 2018-10-16 VITALS — BP 135/88 | HR 89 | Resp 18 | Ht 66.0 in | Wt 260.8 lb

## 2018-10-16 DIAGNOSIS — M7061 Trochanteric bursitis, right hip: Secondary | ICD-10-CM

## 2018-10-16 DIAGNOSIS — M7062 Trochanteric bursitis, left hip: Secondary | ICD-10-CM

## 2018-10-16 DIAGNOSIS — G4709 Other insomnia: Secondary | ICD-10-CM

## 2018-10-16 DIAGNOSIS — M797 Fibromyalgia: Secondary | ICD-10-CM

## 2018-10-16 DIAGNOSIS — R5383 Other fatigue: Secondary | ICD-10-CM | POA: Diagnosis not present

## 2018-10-16 DIAGNOSIS — M19042 Primary osteoarthritis, left hand: Secondary | ICD-10-CM

## 2018-10-16 DIAGNOSIS — M79642 Pain in left hand: Secondary | ICD-10-CM

## 2018-10-16 DIAGNOSIS — M19041 Primary osteoarthritis, right hand: Secondary | ICD-10-CM

## 2018-10-16 DIAGNOSIS — M79641 Pain in right hand: Secondary | ICD-10-CM

## 2018-10-16 DIAGNOSIS — M5030 Other cervical disc degeneration, unspecified cervical region: Secondary | ICD-10-CM

## 2018-10-16 DIAGNOSIS — E559 Vitamin D deficiency, unspecified: Secondary | ICD-10-CM

## 2018-10-16 DIAGNOSIS — G5601 Carpal tunnel syndrome, right upper limb: Secondary | ICD-10-CM

## 2018-10-16 DIAGNOSIS — M19072 Primary osteoarthritis, left ankle and foot: Secondary | ICD-10-CM

## 2018-10-16 DIAGNOSIS — M170 Bilateral primary osteoarthritis of knee: Secondary | ICD-10-CM

## 2018-10-16 DIAGNOSIS — M25461 Effusion, right knee: Secondary | ICD-10-CM

## 2018-10-16 DIAGNOSIS — M19071 Primary osteoarthritis, right ankle and foot: Secondary | ICD-10-CM

## 2018-10-16 DIAGNOSIS — F331 Major depressive disorder, recurrent, moderate: Secondary | ICD-10-CM

## 2018-10-16 MED ORDER — PREDNISONE 5 MG PO TABS: ORAL_TABLET | ORAL | 0 refills | Status: DC

## 2018-10-16 MED ORDER — LIDOCAINE HCL 1 % IJ SOLN
3.0000 mL | INTRAMUSCULAR | Status: AC | PRN
  Administered 2018-10-16: 3 mL

## 2018-10-20 LAB — CYCLIC CITRUL PEPTIDE ANTIBODY, IGG: Cyclic Citrullin Peptide Ab: <16 UNITS

## 2018-10-20 LAB — URIC ACID: Uric Acid, Serum: 7.1 mg/dL — ABNORMAL HIGH (ref 2.5–7.0)

## 2018-10-20 LAB — 14-3-3 ETA PROTEIN: 14-3-3 eta Protein: <0.2 ng/mL (ref <0.2)

## 2018-10-20 LAB — ANA: Anti Nuclear Antibody (ANA): NEGATIVE

## 2018-10-20 LAB — SEDIMENTATION RATE: Sed Rate: 36 mm/h — ABNORMAL HIGH (ref 0–20)

## 2018-10-20 LAB — RHEUMATOID FACTOR: Rhuematoid fact SerPl-aCnc: <14 IU/mL (ref <14)

## 2018-10-21 DIAGNOSIS — M25511 Pain in right shoulder: Secondary | ICD-10-CM | POA: Diagnosis not present

## 2018-10-21 DIAGNOSIS — Z79891 Long term (current) use of opiate analgesic: Secondary | ICD-10-CM | POA: Diagnosis not present

## 2018-10-21 DIAGNOSIS — M79606 Pain in leg, unspecified: Secondary | ICD-10-CM | POA: Diagnosis not present

## 2018-10-21 DIAGNOSIS — M25569 Pain in unspecified knee: Secondary | ICD-10-CM | POA: Diagnosis not present

## 2018-10-21 DIAGNOSIS — M25559 Pain in unspecified hip: Secondary | ICD-10-CM | POA: Diagnosis not present

## 2018-10-21 DIAGNOSIS — M25512 Pain in left shoulder: Secondary | ICD-10-CM | POA: Diagnosis not present

## 2018-10-21 DIAGNOSIS — M545 Low back pain: Secondary | ICD-10-CM | POA: Diagnosis not present

## 2018-10-21 NOTE — Progress Notes (Signed)
Labs reviewed with Dr. Estanislado Pandy.  Synovial fluid did not reveal any crystals or growth on culture.   14-3-3 eta negative, RF negative, CCP negative. Sed rate borderline elevated but trending down.  ANA negative. Uric acid borderline elevated-7.1.

## 2018-10-22 LAB — ANAEROBIC AND AEROBIC CULTURE
AER RESULT:: NO GROWTH
MICRO NUMBER:: 672222
MICRO NUMBER:: 672867
SPECIMEN QUALITY:: ADEQUATE
SPECIMEN QUALITY:: ADEQUATE

## 2018-10-22 LAB — SYNOVIAL CELL COUNT + DIFF, W/ CRYSTALS
Basophils, %: 0 %
Eosinophils-Synovial: 0 % (ref 0–2)
Lymphocytes-Synovial Fld: 87 % — ABNORMAL HIGH (ref 0–74)
Monocyte/Macrophage: 0 % (ref 0–69)
Neutrophil, Synovial: 13 % (ref 0–24)
Synoviocytes, %: 0 % (ref 0–15)
WBC, Synovial: 472 cells/uL — ABNORMAL HIGH (ref <150)

## 2018-10-23 ENCOUNTER — Ambulatory Visit: Payer: Self-pay | Admitting: Physician Assistant

## 2018-10-23 DIAGNOSIS — I10 Essential (primary) hypertension: Secondary | ICD-10-CM | POA: Diagnosis not present

## 2018-10-23 DIAGNOSIS — Z79899 Other long term (current) drug therapy: Secondary | ICD-10-CM | POA: Diagnosis not present

## 2018-10-23 DIAGNOSIS — Z23 Encounter for immunization: Secondary | ICD-10-CM | POA: Diagnosis not present

## 2018-10-23 DIAGNOSIS — W540XXA Bitten by dog, initial encounter: Secondary | ICD-10-CM | POA: Diagnosis not present

## 2018-10-23 DIAGNOSIS — M797 Fibromyalgia: Secondary | ICD-10-CM | POA: Diagnosis not present

## 2018-10-23 DIAGNOSIS — S61217A Laceration without foreign body of left little finger without damage to nail, initial encounter: Secondary | ICD-10-CM | POA: Diagnosis not present

## 2018-10-23 DIAGNOSIS — S61257A Open bite of left little finger without damage to nail, initial encounter: Secondary | ICD-10-CM | POA: Diagnosis not present

## 2018-10-23 NOTE — Progress Notes (Deleted)
Office Visit Note  Patient: Amanda Hammond             Date of Birth: 06-Aug-1970           MRN: [id number]             PCP: Sandi Mealy, MD Referring: Sandi Mealy, MD Visit Date: 11/06/2018 Occupation: @GUAROCC @  Subjective:  No chief complaint on file.   History of Present Illness: Amanda Hammond is a 48 y.o. female ***   Activities of Daily Living:  Patient reports morning stiffness for *** {minute/hour:19697}.   Patient {ACTIONS;DENIES/REPORTS:21021675::"Denies"} nocturnal pain.  Difficulty dressing/grooming: {ACTIONS;DENIES/REPORTS:21021675::"Denies"} Difficulty climbing stairs: {ACTIONS;DENIES/REPORTS:21021675::"Denies"} Difficulty getting out of chair: {ACTIONS;DENIES/REPORTS:21021675::"Denies"} Difficulty using hands for taps, buttons, cutlery, and/or writing: {ACTIONS;DENIES/REPORTS:21021675::"Denies"}  No Rheumatology ROS completed.   PMFS History:  Patient Active Problem List   Diagnosis Date Noted  . Rheumatoid factor positive 11/06/2016  . Trochanteric bursitis of both hips 11/06/2016  . History of fibromyalgia 11/06/2016  . DDD (degenerative disc disease), cervical 11/06/2016  . Carpal tunnel syndrome, left upper limb 11/06/2016  . Carpal tunnel syndrome, right upper limb 11/06/2016  . Trigger finger, left middle finger 11/06/2016  . Major depressive disorder, recurrent episode, moderate ([place]) 04/27/2016  . Pain in left hand 04/25/2016  . Pain in right hand 04/25/2016  . Primary osteoarthritis of both hands 04/25/2016  . Primary osteoarthritis of both knees 04/25/2016  . Primary osteoarthritis of both feet 04/25/2016  . Fibromyalgia 02/17/2016  . Other insomnia 02/17/2016  . Fatigue 02/17/2016  . B12 DEFICIENCY 02/01/2010  . Vitamin D deficiency 02/01/2010  . ANEMIA, IRON DEFICIENCY 12/30/2009  . OTHER DYSPNEA AND RESPIRATORY ABNORMALITIES 12/30/2009  . Nonspecific (abnormal) findings on radiological and other examination of body  structure 12/30/2009  . COMPUTERIZED TOMOGRAPHY, CHEST, ABNORMAL 12/30/2009    Past Medical History:  Diagnosis Date  . Anemia   . Fibromyositis   . Hypertension   . Osteoarthritis   . PONV (postoperative nausea and vomiting)   . Vitamin D deficiency     Family History  Problem Relation Age of Onset  . Diabetes Mother   . Hypertension Mother   . Hypertension Father   . Hypertension Brother   . Osteoarthritis Brother   . Hypertension Brother   . Osteoarthritis Brother   . Endometriosis Daughter   . Anesthesia problems Neg Hx   . Hypotension Neg Hx   . Pseudochol deficiency Neg Hx   . Malignant hyperthermia Neg Hx    Past Surgical History:  Procedure Laterality Date  . APPENDECTOMY  2010   [facility]  . CARPAL TUNNEL RELEASE Left   . CHOLECYSTECTOMY  1999   lap, [place]  . GASTRIC BYPASS  2003   [facility]  . HERNIA REPAIR  08/2010, 02/2010    incisional, [facility], [place]  . INCISIONAL HERNIA REPAIR  10/12/2010   Procedure: HERNIA REPAIR INCISIONAL;  Surgeon: Jamesetta So;  Location: [facility];  Service: General;  Laterality: N/A;  Recurrent Incisional Hernia Repair with Mesh  . KNEE SURGERY Right 01/2018   meniscal tear   . PANNICULECTOMY  2008   [place]  . SHOULDER SURGERY    . TENNIS ELBOW RELEASE/NIRSCHEL PROCEDURE Bilateral    Social History   Social History Narrative  . Not on file    There is no immunization history on file for this patient.   Objective: Vital Signs: There were no vitals taken for this visit.   Physical Exam   Musculoskeletal Exam: ***  CDAI Exam: CDAI Score: - Patient Global: -; Provider Global: - Swollen: -; Tender: - Joint Exam   No joint exam has been documented for this visit   There is currently no information documented on the homunculus. Go to the Rheumatology activity and complete the homunculus joint exam.  Investigation: No additional findings.  Imaging: Xr Hand 2 View Left  Result Date: 10/16/2018 PIP and DIP narrowing was noted.  No  MCP, intercarpal or radiocarpal joint space narrowing was noted.  No erosive changes were noted. Impression: These findings are consistent with osteoarthritis of the hand.  Xr Hand 2 View Right  Result Date: 10/16/2018 PIP and DIP narrowing was noted.  No MCP, intercarpal or radiocarpal joint space narrowing was noted.  No erosive changes were noted. Impression: These findings are consistent with osteoarthritis of the hand.   Recent Labs: Lab Results  Component Value Date   WBC 8.3 03/21/2017   HGB 10.9 (L) 03/21/2017   PLT 328 03/21/2017   NA 139 03/21/2017   K 3.7 03/21/2017   CL 103 03/21/2017   CO2 29 03/21/2017   GLUCOSE 112 (H) 03/21/2017   BUN 6 (L) 03/21/2017   CREATININE 0.67 03/21/2017   BILITOT 0.4 03/21/2017   ALKPHOS 84 12/05/2009   AST 11 03/21/2017   ALT 9 03/21/2017   PROT 6.4 03/21/2017   ALBUMIN 3.1 (L) 12/05/2009   CALCIUM 9.0 03/21/2017   GFRAA 123 03/21/2017    Speciality Comments: No specialty comments available.  Procedures:  No procedures performed Allergies: Patient has no known allergies.   Assessment / Plan:     Visit Diagnoses: No diagnosis found.  Orders: No orders of the defined types were placed in this encounter.  No orders of the defined types were placed in this encounter.   Face-to-face time spent with patient was *** minutes. Greater than 50% of time was spent in counseling and coordination of care.  Follow-Up Instructions: No follow-ups on file.   Gearldine Bienenstock, PA-C  Note - This record has been created using Dragon software.  Chart creation errors have been sought, but may not always  have been located. Such creation errors do not reflect on  the standard of medical care.

## 2018-10-29 DIAGNOSIS — M25561 Pain in right knee: Secondary | ICD-10-CM | POA: Diagnosis not present

## 2018-11-05 DIAGNOSIS — M1711 Unilateral primary osteoarthritis, right knee: Secondary | ICD-10-CM | POA: Diagnosis not present

## 2018-11-06 ENCOUNTER — Ambulatory Visit: Payer: Medicare Other | Admitting: Physician Assistant

## 2018-11-12 ENCOUNTER — Other Ambulatory Visit: Payer: Self-pay

## 2018-11-12 DIAGNOSIS — I83891 Varicose veins of right lower extremities with other complications: Secondary | ICD-10-CM

## 2018-11-15 ENCOUNTER — Encounter: Payer: Medicare Other | Admitting: Family

## 2018-11-15 ENCOUNTER — Encounter (HOSPITAL_COMMUNITY): Payer: Medicare Other

## 2018-11-21 DIAGNOSIS — M1711 Unilateral primary osteoarthritis, right knee: Secondary | ICD-10-CM | POA: Diagnosis not present

## 2018-11-21 DIAGNOSIS — M1712 Unilateral primary osteoarthritis, left knee: Secondary | ICD-10-CM | POA: Diagnosis not present

## 2018-11-25 DIAGNOSIS — Z9884 Bariatric surgery status: Secondary | ICD-10-CM | POA: Diagnosis not present

## 2018-11-25 DIAGNOSIS — M069 Rheumatoid arthritis, unspecified: Secondary | ICD-10-CM | POA: Diagnosis not present

## 2018-11-25 DIAGNOSIS — R51 Headache: Secondary | ICD-10-CM | POA: Diagnosis not present

## 2018-11-25 DIAGNOSIS — K219 Gastro-esophageal reflux disease without esophagitis: Secondary | ICD-10-CM | POA: Diagnosis not present

## 2018-11-25 DIAGNOSIS — Z86711 Personal history of pulmonary embolism: Secondary | ICD-10-CM | POA: Diagnosis not present

## 2018-11-25 DIAGNOSIS — R197 Diarrhea, unspecified: Secondary | ICD-10-CM | POA: Diagnosis not present

## 2018-11-25 DIAGNOSIS — R1084 Generalized abdominal pain: Secondary | ICD-10-CM | POA: Diagnosis not present

## 2018-11-25 DIAGNOSIS — Z79899 Other long term (current) drug therapy: Secondary | ICD-10-CM | POA: Diagnosis not present

## 2018-11-25 DIAGNOSIS — M797 Fibromyalgia: Secondary | ICD-10-CM | POA: Diagnosis not present

## 2018-11-25 DIAGNOSIS — R112 Nausea with vomiting, unspecified: Secondary | ICD-10-CM | POA: Diagnosis not present

## 2018-11-25 DIAGNOSIS — I10 Essential (primary) hypertension: Secondary | ICD-10-CM | POA: Diagnosis not present

## 2018-11-25 DIAGNOSIS — Z9049 Acquired absence of other specified parts of digestive tract: Secondary | ICD-10-CM | POA: Diagnosis not present

## 2018-12-02 DIAGNOSIS — I10 Essential (primary) hypertension: Secondary | ICD-10-CM | POA: Diagnosis not present

## 2018-12-02 DIAGNOSIS — E876 Hypokalemia: Secondary | ICD-10-CM | POA: Diagnosis not present

## 2018-12-03 DIAGNOSIS — M1711 Unilateral primary osteoarthritis, right knee: Secondary | ICD-10-CM | POA: Diagnosis not present

## 2018-12-06 ENCOUNTER — Other Ambulatory Visit: Payer: Self-pay

## 2018-12-06 DIAGNOSIS — I83891 Varicose veins of right lower extremities with other complications: Secondary | ICD-10-CM

## 2018-12-10 ENCOUNTER — Telehealth (HOSPITAL_COMMUNITY): Payer: Self-pay

## 2018-12-10 NOTE — Telephone Encounter (Signed)

## 2018-12-11 ENCOUNTER — Encounter (HOSPITAL_COMMUNITY): Payer: Medicare Other

## 2018-12-17 DIAGNOSIS — M25512 Pain in left shoulder: Secondary | ICD-10-CM | POA: Diagnosis not present

## 2018-12-17 DIAGNOSIS — M25569 Pain in unspecified knee: Secondary | ICD-10-CM | POA: Diagnosis not present

## 2018-12-17 DIAGNOSIS — M25559 Pain in unspecified hip: Secondary | ICD-10-CM | POA: Diagnosis not present

## 2018-12-17 DIAGNOSIS — M545 Low back pain: Secondary | ICD-10-CM | POA: Diagnosis not present

## 2019-01-02 ENCOUNTER — Encounter (HOSPITAL_COMMUNITY): Payer: Medicare Other

## 2019-01-13 DIAGNOSIS — M19012 Primary osteoarthritis, left shoulder: Secondary | ICD-10-CM | POA: Diagnosis not present

## 2019-01-21 DIAGNOSIS — R0989 Other specified symptoms and signs involving the circulatory and respiratory systems: Secondary | ICD-10-CM | POA: Diagnosis not present

## 2019-01-21 DIAGNOSIS — J029 Acute pharyngitis, unspecified: Secondary | ICD-10-CM | POA: Diagnosis not present

## 2019-02-10 DIAGNOSIS — M546 Pain in thoracic spine: Secondary | ICD-10-CM | POA: Diagnosis not present

## 2019-02-10 DIAGNOSIS — M50222 Other cervical disc displacement at C5-C6 level: Secondary | ICD-10-CM | POA: Diagnosis not present

## 2019-02-10 DIAGNOSIS — M542 Cervicalgia: Secondary | ICD-10-CM | POA: Diagnosis not present

## 2019-02-10 DIAGNOSIS — M5020 Other cervical disc displacement, unspecified cervical region: Secondary | ICD-10-CM | POA: Diagnosis not present

## 2019-02-10 DIAGNOSIS — M2578 Osteophyte, vertebrae: Secondary | ICD-10-CM | POA: Diagnosis not present

## 2019-02-10 DIAGNOSIS — M4802 Spinal stenosis, cervical region: Secondary | ICD-10-CM | POA: Diagnosis not present

## 2019-02-11 DIAGNOSIS — M25559 Pain in unspecified hip: Secondary | ICD-10-CM | POA: Diagnosis not present

## 2019-02-11 DIAGNOSIS — M25569 Pain in unspecified knee: Secondary | ICD-10-CM | POA: Diagnosis not present

## 2019-02-11 DIAGNOSIS — M545 Low back pain: Secondary | ICD-10-CM | POA: Diagnosis not present

## 2019-02-11 DIAGNOSIS — M25512 Pain in left shoulder: Secondary | ICD-10-CM | POA: Diagnosis not present

## 2019-02-13 DIAGNOSIS — M1711 Unilateral primary osteoarthritis, right knee: Secondary | ICD-10-CM | POA: Diagnosis not present

## 2019-03-10 DIAGNOSIS — M25512 Pain in left shoulder: Secondary | ICD-10-CM | POA: Diagnosis not present

## 2019-03-10 DIAGNOSIS — M25559 Pain in unspecified hip: Secondary | ICD-10-CM | POA: Diagnosis not present

## 2019-03-10 DIAGNOSIS — M25569 Pain in unspecified knee: Secondary | ICD-10-CM | POA: Diagnosis not present

## 2019-03-10 DIAGNOSIS — G43001 Migraine without aura, not intractable, with status migrainosus: Secondary | ICD-10-CM | POA: Diagnosis not present

## 2019-03-10 DIAGNOSIS — M545 Low back pain: Secondary | ICD-10-CM | POA: Diagnosis not present

## 2019-04-22 ENCOUNTER — Ambulatory Visit: Payer: Medicare Other | Admitting: Rheumatology

## 2019-04-22 DIAGNOSIS — M170 Bilateral primary osteoarthritis of knee: Secondary | ICD-10-CM | POA: Diagnosis not present

## 2019-05-05 DIAGNOSIS — M25512 Pain in left shoulder: Secondary | ICD-10-CM | POA: Diagnosis not present

## 2019-05-05 DIAGNOSIS — G43001 Migraine without aura, not intractable, with status migrainosus: Secondary | ICD-10-CM | POA: Diagnosis not present

## 2019-05-05 DIAGNOSIS — M545 Low back pain: Secondary | ICD-10-CM | POA: Diagnosis not present

## 2019-05-05 DIAGNOSIS — M25559 Pain in unspecified hip: Secondary | ICD-10-CM | POA: Diagnosis not present

## 2019-05-16 NOTE — Progress Notes (Deleted)
Office Visit Note  Patient: Amanda Hammond             Date of Birth: 03/14/1971           MRN: [id number]             PCP: Kela Millin, MD Referring: Kela Millin, MD Visit Date: 05/22/2019 Occupation: @GUAROCC @  Subjective:  No chief complaint on file.   History of Present Illness: Amanda Hammond is a 49 y.o. female ***   Activities of Daily Living:  Patient reports morning stiffness for *** {minute/hour:19697}.   Patient {ACTIONS;DENIES/REPORTS:21021675::"Denies"} nocturnal pain.  Difficulty dressing/grooming: {ACTIONS;DENIES/REPORTS:21021675::"Denies"} Difficulty climbing stairs: {ACTIONS;DENIES/REPORTS:21021675::"Denies"} Difficulty getting out of chair: {ACTIONS;DENIES/REPORTS:21021675::"Denies"} Difficulty using hands for taps, buttons, cutlery, and/or writing: {ACTIONS;DENIES/REPORTS:21021675::"Denies"}  No Rheumatology ROS completed.   PMFS History:  Patient Active Problem List   Diagnosis Date Noted  . Rheumatoid factor positive 11/06/2016  . Trochanteric bursitis of both hips 11/06/2016  . History of fibromyalgia 11/06/2016  . DDD (degenerative disc disease), cervical 11/06/2016  . Carpal tunnel syndrome, left upper limb 11/06/2016  . Carpal tunnel syndrome, right upper limb 11/06/2016  . Trigger finger, left middle finger 11/06/2016  . Major depressive disorder, recurrent episode, moderate (HCC) 04/27/2016  . Pain in left hand 04/25/2016  . Pain in right hand 04/25/2016  . Primary osteoarthritis of both hands 04/25/2016  . Primary osteoarthritis of both knees 04/25/2016  . Primary osteoarthritis of both feet 04/25/2016  . Fibromyalgia 02/17/2016  . Other insomnia 02/17/2016  . Fatigue 02/17/2016  . B12 DEFICIENCY 02/01/2010  . Vitamin D deficiency 02/01/2010  . ANEMIA, IRON DEFICIENCY 12/30/2009  . OTHER DYSPNEA AND RESPIRATORY ABNORMALITIES 12/30/2009  . Nonspecific (abnormal) findings on radiological and other examination of body  structure 12/30/2009  . COMPUTERIZED TOMOGRAPHY, CHEST, ABNORMAL 12/30/2009    Past Medical History:  Diagnosis Date  . Anemia   . Fibromyositis   . Hypertension   . Osteoarthritis   . PONV (postoperative nausea and vomiting)   . Vitamin D deficiency     Family History  Problem Relation Age of Onset  . Diabetes Mother   . Hypertension Mother   . Hypertension Father   . Hypertension Brother   . Osteoarthritis Brother   . Hypertension Brother   . Osteoarthritis Brother   . Endometriosis Daughter   . Anesthesia problems Neg Hx   . Hypotension Neg Hx   . Pseudochol deficiency Neg Hx   . Malignant hyperthermia Neg Hx    Past Surgical History:  Procedure Laterality Date  . APPENDECTOMY  2010   [facility]  . CARPAL TUNNEL RELEASE Left   . CHOLECYSTECTOMY  1999   lap, [facility]  . GASTRIC BYPASS  2003   [facility]  . HERNIA REPAIR  08/2010, 02/2010    incisional, [facility], [facility]  . INCISIONAL HERNIA REPAIR  10/12/2010   Procedure: HERNIA REPAIR INCISIONAL;  Surgeon: 12/13/2010;  Location: [facility];  Service: General;  Laterality: N/A;  Recurrent Incisional Hernia Repair with Mesh  . KNEE SURGERY Right 01/2018   meniscal tear   . PANNICULECTOMY  2008   [facility]  . SHOULDER SURGERY    . TENNIS ELBOW RELEASE/NIRSCHEL PROCEDURE Bilateral    Social History   Social History Narrative  . Not on file    There is no immunization history on file for this patient.   Objective: Vital Signs: There were no vitals taken for this visit.   Physical Exam   Musculoskeletal Exam: ***  CDAI Exam: CDAI Score: - Patient Global: -; Provider Global: - Swollen: -; Tender: - Joint Exam 05/22/2019   No joint exam has been documented for this visit   There is currently no information documented on the homunculus. Go to the Rheumatology activity and complete the homunculus joint exam.  Investigation: No additional findings.  Imaging: No results found.  Recent Labs: Lab Results  Component Value Date    WBC 8.3 03/21/2017   HGB 10.9 (L) 03/21/2017   PLT 328 03/21/2017   NA 139 03/21/2017   K 3.7 03/21/2017   CL 103 03/21/2017   CO2 29 03/21/2017   GLUCOSE 112 (H) 03/21/2017   BUN 6 (L) 03/21/2017   CREATININE 0.67 03/21/2017   BILITOT 0.4 03/21/2017   ALKPHOS 84 12/05/2009   AST 11 03/21/2017   ALT 9 03/21/2017   PROT 6.4 03/21/2017   ALBUMIN 3.1 (L) 12/05/2009   CALCIUM 9.0 03/21/2017   GFRAA 123 03/21/2017    Speciality Comments: No specialty comments available.  Procedures:  No procedures performed Allergies: Patient has no known allergies.   Assessment / Plan:     Visit Diagnoses: No diagnosis found.  Orders: No orders of the defined types were placed in this encounter.  No orders of the defined types were placed in this encounter.   Face-to-face time spent with patient was *** minutes. Greater than 50% of time was spent in counseling and coordination of care.  Follow-Up Instructions: No follow-ups on file.   Ofilia Neas, PA-C  Note - This record has been created using Dragon software.  Chart creation errors have been sought, but may not always  have been located. Such creation errors do not reflect on  the standard of medical care.

## 2019-05-22 ENCOUNTER — Ambulatory Visit: Payer: Medicare Other | Admitting: Rheumatology

## 2019-05-26 NOTE — Progress Notes (Deleted)
Office Visit Note  Patient: Amanda Hammond             Date of Birth: 28-Jan-1971           MRN: [id number]             PCP: Sandi Mealy, MD Referring: Sandi Mealy, MD Visit Date: 05/29/2019 Occupation: @GUAROCC @  Subjective:  No chief complaint on file.   History of Present Illness: Amanda Hammond is a 49 y.o. female ***   Activities of Daily Living:  Patient reports morning stiffness for *** {minute/hour:19697}.   Patient {ACTIONS;DENIES/REPORTS:21021675::"Denies"} nocturnal pain.  Difficulty dressing/grooming: {ACTIONS;DENIES/REPORTS:21021675::"Denies"} Difficulty climbing stairs: {ACTIONS;DENIES/REPORTS:21021675::"Denies"} Difficulty getting out of chair: {ACTIONS;DENIES/REPORTS:21021675::"Denies"} Difficulty using hands for taps, buttons, cutlery, and/or writing: {ACTIONS;DENIES/REPORTS:21021675::"Denies"}  No Rheumatology ROS completed.   PMFS History:  Patient Active Problem List   Diagnosis Date Noted  . Rheumatoid factor positive 11/06/2016  . Trochanteric bursitis of both hips 11/06/2016  . History of fibromyalgia 11/06/2016  . DDD (degenerative disc disease), cervical 11/06/2016  . Carpal tunnel syndrome, left upper limb 11/06/2016  . Carpal tunnel syndrome, right upper limb 11/06/2016  . Trigger finger, left middle finger 11/06/2016  . Major depressive disorder, recurrent episode, moderate ([place]) 04/27/2016  . Pain in left hand 04/25/2016  . Pain in right hand 04/25/2016  . Primary osteoarthritis of both hands 04/25/2016  . Primary osteoarthritis of both knees 04/25/2016  . Primary osteoarthritis of both feet 04/25/2016  . Fibromyalgia 02/17/2016  . Other insomnia 02/17/2016  . Fatigue 02/17/2016  . B12 DEFICIENCY 02/01/2010  . Vitamin D deficiency 02/01/2010  . ANEMIA, IRON DEFICIENCY 12/30/2009  . OTHER DYSPNEA AND RESPIRATORY ABNORMALITIES 12/30/2009  . Nonspecific (abnormal) findings on radiological and other examination of body  structure 12/30/2009  . COMPUTERIZED TOMOGRAPHY, CHEST, ABNORMAL 12/30/2009    Past Medical History:  Diagnosis Date  . Anemia   . Fibromyositis   . Hypertension   . Osteoarthritis   . PONV (postoperative nausea and vomiting)   . Vitamin D deficiency     Family History  Problem Relation Age of Onset  . Diabetes Mother   . Hypertension Mother   . Hypertension Father   . Hypertension Brother   . Osteoarthritis Brother   . Hypertension Brother   . Osteoarthritis Brother   . Endometriosis Daughter   . Anesthesia problems Neg Hx   . Hypotension Neg Hx   . Pseudochol deficiency Neg Hx   . Malignant hyperthermia Neg Hx    Past Surgical History:  Procedure Laterality Date  . APPENDECTOMY  2010   [facility]  . CARPAL TUNNEL RELEASE Left   . CHOLECYSTECTOMY  1999   lap, [place]  . GASTRIC BYPASS  2003   [facility]  . HERNIA REPAIR  08/2010, 02/2010    incisional, [facility], [place]  . INCISIONAL HERNIA REPAIR  10/12/2010   Procedure: HERNIA REPAIR INCISIONAL;  Surgeon: Jamesetta So;  Location: [facility];  Service: General;  Laterality: N/A;  Recurrent Incisional Hernia Repair with Mesh  . KNEE SURGERY Right 01/2018   meniscal tear   . PANNICULECTOMY  2008   [facility]  . SHOULDER SURGERY    . TENNIS ELBOW RELEASE/NIRSCHEL PROCEDURE Bilateral    Social History   Social History Narrative  . Not on file    There is no immunization history on file for this patient.   Objective: Vital Signs: There were no vitals taken for this visit.   Physical Exam   Musculoskeletal Exam: ***  CDAI Exam: CDAI Score: -- Patient Global: --; Provider Global: -- Swollen: --; Tender: -- Joint Exam 05/29/2019   No joint exam has been documented for this visit   There is currently no information documented on the homunculus. Go to the Rheumatology activity and complete the homunculus joint exam.  Investigation: No additional findings.  Imaging: No results found.  Recent Labs: Lab Results  Component Value  Date   WBC 8.3 03/21/2017   HGB 10.9 (L) 03/21/2017   PLT 328 03/21/2017   NA 139 03/21/2017   K 3.7 03/21/2017   CL 103 03/21/2017   CO2 29 03/21/2017   GLUCOSE 112 (H) 03/21/2017   BUN 6 (L) 03/21/2017   CREATININE 0.67 03/21/2017   BILITOT 0.4 03/21/2017   ALKPHOS 84 12/05/2009   AST 11 03/21/2017   ALT 9 03/21/2017   PROT 6.4 03/21/2017   ALBUMIN 3.1 (L) 12/05/2009   CALCIUM 9.0 03/21/2017   GFRAA 123 03/21/2017    Speciality Comments: No specialty comments available.  Procedures:  No procedures performed Allergies: Patient has no known allergies.   Assessment / Plan:     Visit Diagnoses: No diagnosis found.  Orders: No orders of the defined types were placed in this encounter.  No orders of the defined types were placed in this encounter.   Face-to-face time spent with patient was *** minutes. Greater than 50% of time was spent in counseling and coordination of care.  Follow-Up Instructions: No follow-ups on file.   Ellen Henri, CMA  Note - This record has been created using Animal nutritionist.  Chart creation errors have been sought, but may not always  have been located. Such creation errors do not reflect on  the standard of medical care.

## 2019-05-29 ENCOUNTER — Ambulatory Visit: Payer: Medicare Other | Admitting: Physician Assistant

## 2019-06-02 DIAGNOSIS — Z0000 Encounter for general adult medical examination without abnormal findings: Secondary | ICD-10-CM | POA: Diagnosis not present

## 2019-06-02 DIAGNOSIS — M2550 Pain in unspecified joint: Secondary | ICD-10-CM | POA: Diagnosis not present

## 2019-06-02 DIAGNOSIS — R5382 Chronic fatigue, unspecified: Secondary | ICD-10-CM | POA: Diagnosis not present

## 2019-06-02 DIAGNOSIS — R7302 Impaired glucose tolerance (oral): Secondary | ICD-10-CM | POA: Diagnosis not present

## 2019-06-02 DIAGNOSIS — Z13220 Encounter for screening for lipoid disorders: Secondary | ICD-10-CM | POA: Diagnosis not present

## 2019-06-02 DIAGNOSIS — R5383 Other fatigue: Secondary | ICD-10-CM | POA: Diagnosis not present

## 2019-06-02 DIAGNOSIS — Z7289 Other problems related to lifestyle: Secondary | ICD-10-CM | POA: Diagnosis not present

## 2019-06-02 DIAGNOSIS — Z136 Encounter for screening for cardiovascular disorders: Secondary | ICD-10-CM | POA: Diagnosis not present

## 2019-06-02 NOTE — Progress Notes (Signed)
Office Visit Note  Patient: Amanda Hammond             Date of Birth: 02/26/1971           MRN: [id number]             PCP: Suzan Slick, MD Referring: Suzan Slick, MD Visit Date: 06/05/2019 Occupation: @GUAROCC @  Subjective:  Left shoulder joint pain   History of Present Illness: Amanda Hammond is a 49 y.o. female with history of fibromyalgia, osteoarthritis and DDD.  Patient reports that she has been having rheumatoid arthritis flare for the past several weeks.  She has generalized muscle aches muscle tenderness due to fibromyalgia.  She states she has been under increased stress at home recently.  She states that she has been seeing Dr. 52 and is planning on proceeding with a right knee replacement once her BMI is less than 40.  She states that she recently lost 20 pounds but did not reach her goal and became discouraged.  She has gained the weight back so surgery has been postponed at this time.  She has chronic severe pain in the right knee joint and continues to experience joint swelling and warmth.  She has been unable to exercise due to discomfort she has been experiencing.  She continues to take Percocet for pain relief.  She states that 4 days ago she started having discomfort in the left shoulder.  She states that she had rotator cuff repair by Dr. Case in the past and is concerned that she has retorn rotator cuff.  She denies any injuries or changes in activity prior to onset of symptoms.  She has been experiencing significant nocturnal pain and difficulty raising her arm.  She continues to have chronic pain in both hands.  She has been very tender in the right second MCP joint.  She states that her father has rheumatoid arthritis.    Activities of Daily Living:  Patient reports joint stiffness all day  Patient Reports nocturnal pain.  Difficulty dressing/grooming: Reports Difficulty climbing stairs: Reports Difficulty getting out of chair: Reports Difficulty  using hands for taps, buttons, cutlery, and/or writing: Reports  Review of Systems  Constitutional: Positive for fatigue.  HENT: Positive for mouth dryness and nose dryness. Negative for mouth sores.   Eyes: Negative for pain, itching, visual disturbance and dryness.  Respiratory: Negative for cough, hemoptysis, shortness of breath and difficulty breathing.   Cardiovascular: Negative for chest pain, palpitations, hypertension and swelling in legs/feet.  Gastrointestinal: Negative for blood in stool, constipation and diarrhea.  Endocrine: Negative for increased urination.  Genitourinary: Negative for difficulty urinating and painful urination.  Musculoskeletal: Positive for arthralgias, joint pain, joint swelling, myalgias, morning stiffness, muscle tenderness and myalgias. Negative for muscle weakness.  Skin: Positive for color change. Negative for pallor, rash, hair loss, nodules/bumps, skin tightness, ulcers and sensitivity to sunlight.  Allergic/Immunologic: Negative for susceptible to infections.  Neurological: Positive for headaches. Negative for dizziness and memory loss.  Hematological: Negative for swollen glands.  Psychiatric/Behavioral: Positive for sleep disturbance. Negative for depressed mood and confusion. The patient is not nervous/anxious.     PMFS History:  Patient Active Problem List   Diagnosis Date Noted  . Rheumatoid factor positive 11/06/2016  . Trochanteric bursitis of both hips 11/06/2016  . History of fibromyalgia 11/06/2016  . DDD (degenerative disc disease), cervical 11/06/2016  . Carpal tunnel syndrome, left upper limb 11/06/2016  . Carpal tunnel syndrome, right upper limb 11/06/2016  .  Trigger finger, left middle finger 11/06/2016  . Major depressive disorder, recurrent episode, moderate (HCC) 04/27/2016  . Pain in left hand 04/25/2016  . Pain in right hand 04/25/2016  . Primary osteoarthritis of both hands 04/25/2016  . Primary osteoarthritis of both  knees 04/25/2016  . Primary osteoarthritis of both feet 04/25/2016  . Fibromyalgia 02/17/2016  . Other insomnia 02/17/2016  . Fatigue 02/17/2016  . B12 DEFICIENCY 02/01/2010  . Vitamin D deficiency 02/01/2010  . ANEMIA, IRON DEFICIENCY 12/30/2009  . OTHER DYSPNEA AND RESPIRATORY ABNORMALITIES 12/30/2009  . Nonspecific (abnormal) findings on radiological and other examination of body structure 12/30/2009  . COMPUTERIZED TOMOGRAPHY, CHEST, ABNORMAL 12/30/2009    Past Medical History:  Diagnosis Date  . Anemia   . Fibromyositis   . Hypertension   . Osteoarthritis   . PONV (postoperative nausea and vomiting)   . Vitamin D deficiency     Family History  Problem Relation Age of Onset  . Diabetes Mother   . Hypertension Mother   . Hypertension Father   . Hypertension Brother   . Osteoarthritis Brother   . Hypertension Brother   . Osteoarthritis Brother   . Endometriosis Daughter   . Anesthesia problems Neg Hx   . Hypotension Neg Hx   . Pseudochol deficiency Neg Hx   . Malignant hyperthermia Neg Hx    Past Surgical History:  Procedure Laterality Date  . APPENDECTOMY  2010   [facility]  . CARPAL TUNNEL RELEASE Left   . CHOLECYSTECTOMY  1999   lap, [facility]  . GASTRIC BYPASS  2003   [facility]  . HERNIA REPAIR  08/2010, 02/2010    incisional, [facility], [facility]  . INCISIONAL HERNIA REPAIR  10/12/2010   Procedure: HERNIA REPAIR INCISIONAL;  Surgeon: Dalia Heading;  Location: [facility];  Service: General;  Laterality: N/A;  Recurrent Incisional Hernia Repair with Mesh  . KNEE SURGERY Right 01/2018   meniscal tear   . PANNICULECTOMY  2008   [facility]  . SHOULDER SURGERY Left   . TENNIS ELBOW RELEASE/NIRSCHEL PROCEDURE Bilateral    Social History   Social History Narrative  . Not on file    There is no immunization history on file for this patient.   Objective: Vital Signs: BP 106/65 (BP Location: Right Arm, Patient Position: Sitting, Cuff Size: Normal)   Pulse (!) 109   Resp 16   Ht  (1.6  m)   Wt 260 lb (117.9 kg)   BMI 46.06 kg/m    Physical Exam Vitals and nursing note reviewed.  Constitutional:      Appearance: She is well-developed.  HENT:     Head: Normocephalic and atraumatic.  Eyes:     Conjunctiva/sclera: Conjunctivae normal.  Pulmonary:     Effort: Pulmonary effort is normal.  Abdominal:     General: Bowel sounds are normal.     Palpations: Abdomen is soft.  Musculoskeletal:     Cervical back: Normal range of motion.  Lymphadenopathy:     Cervical: No cervical adenopathy.  Skin:    General: Skin is warm and dry.     Capillary Refill: Capillary refill takes less than 2 seconds.  Neurological:     Mental Status: She is alert and oriented to person, place, and time.  Psychiatric:        Behavior: Behavior normal.      Musculoskeletal Exam: Generalized hyperalgesia and positive tender points.  C-spine has limited lateral rotation.  Right shoulder has good range of motion  with no discomfort.  Unable to fully assess range of motion of the left shoulder due to the severe pain she is experiencing.  She has tenderness over the left bicipital groove.  Elbow joints have good ROM with no tenderness or inflammation.  PIP and DIP thickening consistent with osteoarthritis of both hands.  Tenderness and synovitis of right 2nd MCP joint.  Tenderness of left 1st MCP joint. Hip joints have good range of motion with no discomfort.  Right knee is warm and swollen and has limited extension.  Left knee has good range of motion with no warmth or effusion  No tenderness or inflammation of ankle joints noted.  CDAI Exam: CDAI Score: 7.2  Patient Global: 6 mm; Provider Global: 6 mm Swollen: 2 ; Tender: 4  Joint Exam 06/05/2019      Right  Left  Glenohumeral      Tender  MCP 1      Tender  MCP 2  Swollen Tender     Knee  Swollen Tender        Investigation: No additional findings.  Imaging: XR Shoulder Left  Result Date: 06/05/2019 No glenohumeral joint space  narrowing was noted.  Acromioclavicular joint arthritis was noted.  No chondrocalcinosis was noted. Impression: These findings are consistent with acromioclavicular joint arthritis.   Recent Labs: Lab Results  Component Value Date   WBC 8.3 03/21/2017   HGB 10.9 (L) 03/21/2017   PLT 328 03/21/2017   NA 139 03/21/2017   K 3.7 03/21/2017   CL 103 03/21/2017   CO2 29 03/21/2017   GLUCOSE 112 (H) 03/21/2017   BUN 6 (L) 03/21/2017   CREATININE 0.67 03/21/2017   BILITOT 0.4 03/21/2017   ALKPHOS 84 12/05/2009   AST 11 03/21/2017   ALT 9 03/21/2017   PROT 6.4 03/21/2017   ALBUMIN 3.1 (L) 12/05/2009   CALCIUM 9.0 03/21/2017   GFRAA 123 03/21/2017    Speciality Comments: No specialty comments available.  Procedures:  No procedures performed Allergies: Patient has no known allergies.   Assessment / Plan:     Visit Diagnoses:  Chronic inflammatory arthritis-patient has been experiencing pain and discomfort in her bilateral shoulder joints.  Left shoulder joint x-ray obtained today showed AC joint arthropathy.  She also has synovitis of her right second MCP joint.  She has warmth and swelling with effusion in her right knee joint.  She most likely has seronegative rheumatoid arthritis.  Her rheumatoid factor has been positive in the past.  She also has positive family history of rheumatoid arthritis.  I will place her on prednisone 20 mg p.o. daily and taper by 5 mg every week.  We will obtain labs today.  After labs my plan is to start her on Plaquenil 200 mg p.o. twice daily.  Indications side effects contraindications were discussed.  Handout was given and consent was taken.  She will need baseline eye examination and then yearly eye examination.  She will need labs in a month and then every 3 months to monitor for drug toxicity.  Patient was counseled on the purpose, proper use, and adverse effects of hydroxychloroquine including nausea/diarrhea, skin rash, headaches, and sun sensitivity.   Discussed importance of annual eye exams while on hydroxychloroquine to monitor to ocular toxicity and discussed importance of frequent laboratory monitoring.  Provided patient with eye exam form for baseline ophthalmologic exam.  Provided patient with educational materials on hydroxychloroquine and answered all questions.  Patient consented to hydroxychloroquine.  Will upload consent  in the media tab.      Fibromyalgia: She has generalized hyperalgesia and positive tender points on exam.  Her fibromyalgia has been flaring over the past 3 weeks.  She has been under increased stress recently and has been unable to exercise due to the severity of her right knee joint pain.  She is ready to proceed with a right knee replacement once her BMI is less than 40.  She declined a referral to weight loss clinic at this time.  We discussed water therapy but she declined at this time.  She continues to take Lyrica 200 mg 3 times daily, Cymbalta 90 mg daily, Zanaflex 4 mg every 8 hours for muscle spasms, and Percocet for pain relief.  She is followed by pain management.  She continues to have chronic fatigue related to insomnia.  She has been experiencing nocturnal pain in her right knee joint.  She takes Ambien 5 mg 1 tablet by mouth at bedtime but continues to have interrupted sleep at night.  We discussed the importance of regular exercise and good sleep hygiene.  She will follow-up in the office in 6 months.  Other insomnia: She takes Ambien 5 mg 1 tablet by mouth at bedtime for insomnia.   Other fatigue: She has been experiencing increased fatigue secondary to insomnia.  Discussed importance of regular exercise and good sleep hygiene.  DDD (degenerative disc disease), cervical: She has limited range of motion with lateral rotation especially to the left.  She experiences chronic neck pain and stiffness.  She has no symptoms of radiculopathy at this time.  Primary osteoarthritis of both hands: She has PIP and DIP  thickening consistent with osteoarthritis of both hands.  She has tenderness inflammation of the right second MCP joint and left first MCP joint.  She has complete fist formation bilaterally.  RF, anti-CCP, and 14 3 3  eta were negative on 10/16/2018.  Her father has rheumatoid arthritis and she is still concerned about the diagnosis.  Primary osteoarthritis of both knees - She had a right knee arthroscopy with meniscectomy in October 2019 performed by Dr. Case.  She has severe pain, joint swelling and warmth of the right knee on exam.  She has been seeing Dr. Noemi Chapel and is planning to proceed with right knee arthroplasty once her BMI is less than 40.  We discussed a referral to weight loss clinic but she declined at this time.  I also offered referral for water therapy and [facility] but she declined at this time.  She is planning on reducing her carbohydrate intake and increasing her fluid intake. Her left knee has good range of motion with no warmth or effusion.  Effusion, right knee - 10/16/2018-A right knee joint aspiration was performed and 14 ml of fluid was drawn off and sent for analysis-White blood cell count was 472.  No crystals were seen no growth on culture noted.  She continues to have warmth and inflammation in the right knee joint.  Trochanteric bursitis of both hips: She has tenderness over bilateral trochanteric bursa.  Primary osteoarthritis of both feet: She is not experiencing any discomfort in her feet at this time.  She has no tenderness or synovitis of ankle joint at this time  Carpal tunnel syndrome, right upper limb - Resolved  Major depressive disorder, recurrent episode, moderate (HCC) - She is taking Cymbalta 60 mg a mouth in the morning and 30 mg  a mouth in the evening and Wellbutrin as prescribed.  Chronic left shoulder pain -She presents today with severe pain in the left shoulder joint.  She is been  experiencing pain in the left shoulder for the past 4 days.  The pain is progressively been getting worse and she has been experiencing nocturnal symptoms.  She has difficulty with active and passive range of motion on exam due to the severity of her pain.  According to the patient she had a left rotator cuff repair by Dr. Case in the past, and she is concerned that she has retorn the rotator cuff.  She requested x-rays of the left shoulder today.  She is planning on following up with Dr. Case if her symptoms persist or worsen.  Plan: XR Shoulder Left  Orders: Orders Placed This Encounter  Procedures  . XR Shoulder Left  . CBC with Differential/Platelet  . COMPLETE METABOLIC PANEL WITH GFR  . Sedimentation rate  . Cyclic citrul peptide antibody, IgG  . Rheumatoid factor  . Glucose 6 phosphate dehydrogenase   No orders of the defined types were placed in this encounter.   Face-to-face time spent with patient was 30 minutes. Greater than 50% of time was spent in counseling and coordination of care.  Follow-Up Instructions: Return in about 4 weeks (around 07/03/2019) for Inflammatory arthritis, fibromyalgia, osteoarthritis.   Gearldine Bienenstock, PA-C   I examined and evaluated the patient with Sherron Ales PA.  Patient appears to have chronic inflammatory arthritis.  She has significant swelling of her right second MCP joint today.  She also had warmth and swelling of her right knee joint.  She has pain in her bilateral shoulder joints with left AC joint arthropathy on the x-ray.  I detailed discussion with patient regarding chronic inflammatory arthritis most likely rheumatoid arthritis.  There is also family history of rheumatoid arthritis.  We will start her on Plaquenil today.  Indications side effects contraindications were discussed at length.  I will give her a prednisone taper as described above.  The plan of care was discussed as noted above.  Pollyann Savoy, MD  Note - This record has  been created using Animal nutritionist.  Chart creation errors have been sought, but may not always  have been located. Such creation errors do not reflect on  the standard of medical care.

## 2019-06-05 ENCOUNTER — Ambulatory Visit: Payer: Medicare Other | Admitting: Physician Assistant

## 2019-06-05 ENCOUNTER — Other Ambulatory Visit: Payer: Self-pay

## 2019-06-05 ENCOUNTER — Encounter: Payer: Self-pay | Admitting: Physician Assistant

## 2019-06-05 ENCOUNTER — Telehealth: Payer: Self-pay

## 2019-06-05 ENCOUNTER — Ambulatory Visit: Payer: Self-pay

## 2019-06-05 VITALS — BP 106/65 | HR 109 | Resp 16 | Ht 63.0 in | Wt 260.0 lb

## 2019-06-05 DIAGNOSIS — M1990 Unspecified osteoarthritis, unspecified site: Secondary | ICD-10-CM

## 2019-06-05 DIAGNOSIS — G5601 Carpal tunnel syndrome, right upper limb: Secondary | ICD-10-CM

## 2019-06-05 DIAGNOSIS — M7061 Trochanteric bursitis, right hip: Secondary | ICD-10-CM

## 2019-06-05 DIAGNOSIS — M5030 Other cervical disc degeneration, unspecified cervical region: Secondary | ICD-10-CM | POA: Diagnosis not present

## 2019-06-05 DIAGNOSIS — M25512 Pain in left shoulder: Secondary | ICD-10-CM

## 2019-06-05 DIAGNOSIS — M25461 Effusion, right knee: Secondary | ICD-10-CM

## 2019-06-05 DIAGNOSIS — M7062 Trochanteric bursitis, left hip: Secondary | ICD-10-CM

## 2019-06-05 DIAGNOSIS — M797 Fibromyalgia: Secondary | ICD-10-CM | POA: Diagnosis not present

## 2019-06-05 DIAGNOSIS — M19041 Primary osteoarthritis, right hand: Secondary | ICD-10-CM

## 2019-06-05 DIAGNOSIS — M19071 Primary osteoarthritis, right ankle and foot: Secondary | ICD-10-CM

## 2019-06-05 DIAGNOSIS — M19042 Primary osteoarthritis, left hand: Secondary | ICD-10-CM

## 2019-06-05 DIAGNOSIS — F331 Major depressive disorder, recurrent, moderate: Secondary | ICD-10-CM

## 2019-06-05 DIAGNOSIS — R5383 Other fatigue: Secondary | ICD-10-CM | POA: Diagnosis not present

## 2019-06-05 DIAGNOSIS — Z79899 Other long term (current) drug therapy: Secondary | ICD-10-CM | POA: Diagnosis not present

## 2019-06-05 DIAGNOSIS — M170 Bilateral primary osteoarthritis of knee: Secondary | ICD-10-CM

## 2019-06-05 DIAGNOSIS — G4709 Other insomnia: Secondary | ICD-10-CM

## 2019-06-05 DIAGNOSIS — G8929 Other chronic pain: Secondary | ICD-10-CM

## 2019-06-05 DIAGNOSIS — M19072 Primary osteoarthritis, left ankle and foot: Secondary | ICD-10-CM

## 2019-06-05 MED ORDER — PREDNISONE 5 MG PO TABS: ORAL_TABLET | ORAL | 0 refills | Status: DC

## 2019-06-05 NOTE — Patient Instructions (Signed)
Standing Labs We placed an order today for your standing lab work.    Please come back and get your standing labs in 1 month and then every 3 months.   We have open lab daily Monday through Thursday from 8:30-12:30 PM and 1:30-4:30 PM and Friday from 8:30-12:30 PM and 1:30-4:00 PM at the office of Dr. Shaili Deveshwar.   You may experience shorter wait times on Monday and Friday afternoons. The office is located at [address], [address], [place], [place] [address] No appointment is necessary.   Labs are drawn by [facility].  You may receive a bill from [facility] for your lab work.  If you wish to have your labs drawn at another location, please call the office 24 hours in advance to send orders.  If you have any questions regarding directions or hours of operation,  please call [phone].   Just as a reminder please drink plenty of water prior to coming for your lab work. Thanks!     Hydroxychloroquine tablets What is this medicine? HYDROXYCHLOROQUINE (hye drox ee KLOR oh kwin) is used to treat rheumatoid arthritis and systemic lupus erythematosus. It is also used to treat malaria. This medicine may be used for other purposes; ask your health care provider or pharmacist if you have questions. COMMON BRAND NAME(S): Plaquenil, Quineprox What should I tell my health care provider before I take this medicine? They need to know if you have any of these conditions:  diabetes  eye disease, vision problems  G6PD deficiency  heart disease  history of irregular heartbeat  if you often drink alcohol  kidney disease  liver disease  porphyria  psoriasis  an unusual or allergic reaction to chloroquine, hydroxychloroquine, other medicines, foods, dyes, or preservatives  pregnant or trying to get pregnant  breast-feeding How should I use this medicine? Take this medicine by mouth with a glass of water. Follow the directions on the prescription label. Do not cut, crush or chew  this medicine. Swallow the tablets whole. Take this medicine with food. Avoid taking antacids within 4 hours of taking this medicine. It is best to separate these medicines by at least 4 hours. Take your medicine at regular intervals. Do not take it more often than directed. Take all of your medicine as directed even if you think you are better. Do not skip doses or stop your medicine early. Talk to your pediatrician regarding the use of this medicine in children. While this drug may be prescribed for selected conditions, precautions do apply. Overdosage: If you think you have taken too much of this medicine contact a poison control center or emergency room at once. NOTE: This medicine is only for you. Do not share this medicine with others. What if I miss a dose? If you miss a dose, take it as soon as you can. If it is almost time for your next dose, take only that dose. Do not take double or extra doses. What may interact with this medicine? Do not take this medicine with any of the following medications:  cisapride  dronedarone  pimozide  thioridazine This medicine may also interact with the following medications:  ampicillin  antacids  cimetidine  cyclosporine  digoxin  kaolin  medicines for diabetes, like insulin, glipizide, glyburide  medicines for seizures like carbamazepine, phenobarbital, phenytoin  mefloquine  methotrexate  other medicines that prolong the QT interval (cause an abnormal heart rhythm)  praziquantel This list may not describe all possible interactions. Give your health care   provider a list of all the medicines, herbs, non-prescription drugs, or dietary supplements you use. Also tell them if you smoke, drink alcohol, or use illegal drugs. Some items may interact with your medicine. What should I watch for while using this medicine? Visit your health care professional for regular checks on your progress. Tell your health care professional if your  symptoms do not start to get better or if they get worse. You may need blood work done while you are taking this medicine. If you take other medicines that can affect heart rhythm, you may need more testing. Talk to your health care professional if you have questions. Your vision may be tested before and during use of this medicine. Tell your health care professional right away if you have any change in your eyesight. What side effects may I notice from receiving this medicine? Side effects that you should report to your doctor or health care professional as soon as possible:  allergic reactions like skin rash, itching or hives, swelling of the face, lips, or tongue  changes in vision  decreased hearing or ringing of the ears  muscle weakness  redness, blistering, peeling or loosening of the skin, including inside the mouth  sensitivity to light  signs and symptoms of a dangerous change in heartbeat or heart rhythm like chest pain; dizziness; fast or irregular heartbeat; palpitations; feeling faint or lightheaded, falls; breathing problems  signs and symptoms of liver injury like dark yellow or brown urine; general ill feeling or flu-like symptoms; light-colored stools; loss of appetite; nausea; right upper belly pain; unusually weak or tired; yellowing of the eyes or skin  signs and symptoms of low blood sugar such as feeling anxious; confusion; dizziness; increased hunger; unusually weak or tired; sweating; shakiness; cold; irritable; headache; blurred vision; fast heartbeat; loss of consciousness  suicidal thoughts  uncontrollable head, mouth, neck, arm, or leg movements Side effects that usually do not require medical attention (report to your doctor or health care professional if they continue or are bothersome):  diarrhea  dizziness  hair loss  headache  irritable  loss of appetite  nausea, vomiting  stomach pain This list may not describe all possible side effects.  Call your doctor for medical advice about side effects. You may report side effects to FDA at 1-800-FDA-1088. Where should I keep my medicine? Keep out of the reach of children. Store at room temperature between 15 and 30 degrees C (59 and 86 degrees F). Protect from moisture and light. Throw away any unused medicine after the expiration date. NOTE: This sheet is a summary. It may not cover all possible information. If you have questions about this medicine, talk to your doctor, pharmacist, or health care provider.  2020 Elsevier/Gold Standard (2018-07-29 12:56:32)  

## 2019-06-05 NOTE — Telephone Encounter (Signed)
Per Dr. Corliss Skains, patient will be starting plaquenil after labs result. Please refer to note from 06/05/2019 for dosage and send prescription. Thanks!

## 2019-06-06 LAB — CYCLIC CITRUL PEPTIDE ANTIBODY, IGG: Cyclic Citrullin Peptide Ab: <16 UNITS

## 2019-06-06 LAB — CBC WITH DIFFERENTIAL/PLATELET
Absolute Monocytes: 419 cells/uL (ref 200–950)
Basophils Absolute: 57 cells/uL (ref 0–200)
Basophils Relative: 0.8 %
Eosinophils Absolute: 170 cells/uL (ref 15–500)
Eosinophils Relative: 2.4 %
HCT: 31.1 % — ABNORMAL LOW (ref 35.0–45.0)
Hemoglobin: 9.9 g/dL — ABNORMAL LOW (ref 11.7–15.5)
Lymphs Abs: 2577 cells/uL (ref 850–3900)
MCH: 23.8 pg — ABNORMAL LOW (ref 27.0–33.0)
MCHC: 31.8 g/dL — ABNORMAL LOW (ref 32.0–36.0)
MCV: 74.8 fL — ABNORMAL LOW (ref 80.0–100.0)
MPV: 9.9 fL (ref 7.5–12.5)
Monocytes Relative: 5.9 %
Neutro Abs: 3877 cells/uL (ref 1500–7800)
Neutrophils Relative %: 54.6 %
Platelets: 385 10*3/uL (ref 140–400)
RBC: 4.16 10*6/uL (ref 3.80–5.10)
RDW: 15.2 % — ABNORMAL HIGH (ref 11.0–15.0)
Total Lymphocyte: 36.3 %
WBC: 7.1 10*3/uL (ref 3.8–10.8)

## 2019-06-06 LAB — COMPLETE METABOLIC PANEL WITH GFR
AG Ratio: 1.5 (calc) (ref 1.0–2.5)
ALT: 11 U/L (ref 6–29)
AST: 13 U/L (ref 10–35)
Albumin: 3.9 g/dL (ref 3.6–5.1)
Alkaline phosphatase (APISO): 119 U/L (ref 31–125)
BUN/Creatinine Ratio: 21 (calc) (ref 6–22)
BUN: 25 mg/dL (ref 7–25)
CO2: 28 mmol/L (ref 20–32)
Calcium: 8.9 mg/dL (ref 8.6–10.2)
Chloride: 104 mmol/L (ref 98–110)
Creat: 1.2 mg/dL — ABNORMAL HIGH (ref 0.50–1.10)
GFR, Est African American: 62 mL/min/{1.73_m2} (ref >=60)
GFR, Est Non African American: 53 mL/min/{1.73_m2} — ABNORMAL LOW (ref >=60)
Globulin: 2.6 g/dL (calc) (ref 1.9–3.7)
Glucose, Bld: 84 mg/dL (ref 65–99)
Potassium: 4.2 mmol/L (ref 3.5–5.3)
Sodium: 139 mmol/L (ref 135–146)
Total Bilirubin: 0.3 mg/dL (ref 0.2–1.2)
Total Protein: 6.5 g/dL (ref 6.1–8.1)

## 2019-06-06 LAB — GLUCOSE 6 PHOSPHATE DEHYDROGENASE: G-6PDH: 26.6 U/g Hgb — ABNORMAL HIGH (ref 7.0–20.5)

## 2019-06-06 LAB — RHEUMATOID FACTOR: Rheumatoid fact SerPl-aCnc: <14 IU/mL (ref <14)

## 2019-06-06 LAB — SEDIMENTATION RATE: Sed Rate: 39 mm/h — ABNORMAL HIGH (ref 0–20)

## 2019-06-06 MED ORDER — HYDROXYCHLOROQUINE SULFATE 200 MG PO TABS: 200.0000 mg | ORAL_TABLET | Freq: Two times a day (BID) | ORAL | 0 refills | Status: DC

## 2019-06-06 NOTE — Telephone Encounter (Signed)
Prescription sent to the pharmacy.

## 2019-06-06 NOTE — Progress Notes (Signed)
I called patient and discussed lab results.  She has been anemic for many years.  She states she has never been evaluated for anemia.  We will make a referral to hematology.  Her creatinine is also elevated.  She states she has been taking anti-inflammatories.  Have advised her not to take any NSAIDs.  G6PD is normal.  Please call in Plaquenil 200 mg p.o. twice daily 90-day supply with no refills.  Patient will have repeat labs in a month.

## 2019-06-06 NOTE — Telephone Encounter (Signed)
-----   Message from Pollyann Savoy, MD sent at 06/06/2019  3:43 PM EST ----- I called patient and discussed lab results.  She has been anemic for many years.  She states she has never been evaluated for anemia.  We will make a referral to hematology.  Her creatinine is also elevated.  She states she has been taking anti-infla mmatories.  Have advised her not to take any NSAIDs.  G6PD is normal.  Please call in Plaquenil 200 mg p.o. twice daily 90-day supply with no refills.  Patient will have repeat labs in a month.

## 2019-06-18 NOTE — Progress Notes (Deleted)
Psychiatric Initial Adult Assessment   Patient Identification: Amanda Hammond MRN:  [id number] Date of Evaluation:  06/18/2019 Referral Source: *** Chief Complaint:   Visit Diagnosis: No diagnosis found.  History of Present Illness:   She was seen in 2018  Amanda Hammond is a 49 y.o. year old female with a history of depression, fibromyalgia, r/o RA, hypertension, pulmonary embolus , who presents for follow up appointment for No diagnosis found.   Psychosocial stressors including chronic pain, unemployment, financial strain and loss of her father in Nov 2017.    Plan 1. Start duloxetine 30 mg daily 2. Continue Wellbutrin 300 mg daily 3. Return to clinic in one month 4. Contact for therapy: Dr. Alford Highland Schneidmiller  [phone] [address], [place], [place] [address]    Associated Signs/Symptoms: Depression Symptoms:  {DEPRESSION SYMPTOMS:20000} (Hypo) Manic Symptoms:  {BHH MANIC SYMPTOMS:22872} Anxiety Symptoms:  {BHH ANXIETY SYMPTOMS:22873} Psychotic Symptoms:  {BHH PSYCHOTIC SYMPTOMS:22874} PTSD Symptoms: {BHH PTSD SYMPTOMS:22875}  Past Psychiatric History:  Outpatient:  Psychiatry admission:  Previous suicide attempt:  Past trials of medication:  History of violence:   Previous Psychotropic Medications: {YES/NO:21197}  Substance Abuse History in the last 12 months:  {yes no:314532}  Consequences of Substance Abuse: {BHH CONSEQUENCES OF SUBSTANCE ABUSE:22880}  Past Medical History:  Past Medical History:  Diagnosis Date  . Anemia   . Fibromyositis   . Hypertension   . Osteoarthritis   . PONV (postoperative nausea and vomiting)   . Vitamin D deficiency     Past Surgical History:  Procedure Laterality Date  . APPENDECTOMY  2010   [facility]  . CARPAL TUNNEL RELEASE Left   . CHOLECYSTECTOMY  1999   lap, [place]  . GASTRIC BYPASS  2003   [facility]  . HERNIA REPAIR  08/2010, 02/2010    incisional, [facility], [place]  . INCISIONAL HERNIA REPAIR  10/12/2010   Procedure:  HERNIA REPAIR INCISIONAL;  Surgeon: Jamesetta So;  Location: [facility];  Service: General;  Laterality: N/A;  Recurrent Incisional Hernia Repair with Mesh  . KNEE SURGERY Right 01/2018   meniscal tear   . PANNICULECTOMY  2008   [facility]  . SHOULDER SURGERY Left   . TENNIS ELBOW RELEASE/NIRSCHEL PROCEDURE Bilateral     Family Psychiatric History: ***  Family History:  Family History  Problem Relation Age of Onset  . Diabetes Mother   . Hypertension Mother   . Hypertension Father   . Hypertension Brother   . Osteoarthritis Brother   . Hypertension Brother   . Osteoarthritis Brother   . Endometriosis Daughter   . Anesthesia problems Neg Hx   . Hypotension Neg Hx   . Pseudochol deficiency Neg Hx   . Malignant hyperthermia Neg Hx     Social History:   Social History   Socioeconomic History  . Marital status: Married    Spouse name: Not on file  . Number of children: Not on file  . Years of education: Not on file  . Highest education level: Not on file  Occupational History  . Not on file  Tobacco Use  . Smoking status: Never Smoker  . Smokeless tobacco: Never Used  Substance and Sexual Activity  . Alcohol use: No  . Drug use: Never  . Sexual activity: Not on file  Other Topics Concern  . Not on file  Social History Narrative  . Not on file   Social Determinants of Health   Financial Resource Strain:   . Difficulty  of Paying Living Expenses:   Food Insecurity:   . Worried About Programme researcher, broadcasting/film/video in the Last Year:   . Barista in the Last Year:   Transportation Needs:   . Freight forwarder (Medical):   Marland Kitchen Lack of Transportation (Non-Medical):   Physical Activity:   . Days of Exercise per Week:   . Minutes of Exercise per Session:   Stress:   . Feeling of Stress :   Social Connections:   . Frequency of Communication with Friends and Family:   . Frequency of Social Gatherings with Friends and Family:   . Attends Religious Services:   . Active  Member of Clubs or Organizations:   . Attends Banker Meetings:   Marland Kitchen Marital Status:     Additional Social History: ***  Allergies:  No Known Allergies  Metabolic Disorder Labs: No results found for: HGBA1C, MPG No results found for: PROLACTIN No results found for: CHOL, TRIG, HDL, CHOLHDL, VLDL, LDLCALC No results found for: TSH  Therapeutic Level Labs: No results found for: LITHIUM No results found for: CBMZ No results found for: VALPROATE  Current Medications: Current Outpatient Medications  Medication Sig Dispense Refill  . buPROPion (WELLBUTRIN XL) 300 MG 24 hr tablet Take 300 mg by mouth daily.     . diclofenac (VOLTAREN) 75 MG EC tablet Take 75 mg by mouth 2 (two) times daily.    . DULoxetine (CYMBALTA) 30 MG capsule Take by mouth at bedtime.     . DULoxetine (CYMBALTA) 60 MG capsule Take 60 mg by mouth daily.  2  . hydrochlorothiazide (HYDRODIURIL) 12.5 MG tablet Take 25 mg by mouth daily.    . hydroxychloroquine (PLAQUENIL) 200 MG tablet Take 1 tablet (200 mg total) by mouth 2 (two) times daily. 180 tablet 0  . ibuprofen (ADVIL) 800 MG tablet Take 800 mg by mouth every 8 (eight) hours as needed. for pain    . lisinopril-hydrochlorothiazide (PRINZIDE,ZESTORETIC) 10-12.5 MG tablet Take 1 tablet by mouth daily.    . Multiple Vitamins-Minerals (MULTIVITAMIN WITH MINERALS) tablet Take 1 tablet by mouth daily.      Marland Kitchen oxyCODONE-acetaminophen (PERCOCET) 10-325 MG tablet TAKE 1 TABLET BY MOUTH EVERY SIX hours AS NEEDED FOR pain    . predniSONE (DELTASONE) 5 MG tablet Take 4 tabs po qd x 7 days, 3  tabs po qd x 7 days, 2  tabs po qd x 7 days, 1  tab po qd x 7 days 70 tablet 0  . pregabalin (LYRICA) 200 MG capsule Take 200 mg by mouth 3 (three) times daily.    Marland Kitchen tiZANidine (ZANAFLEX) 4 MG tablet Take 2-4 mg by mouth every 8 (eight) hours.     Marland Kitchen zolpidem (AMBIEN) 5 MG tablet Take 5 mg by mouth at bedtime.     No current facility-administered medications for this visit.     Musculoskeletal: Strength & Muscle Tone: N/A Gait & Station: N/A Patient leans: N/A  Psychiatric Specialty Exam: Review of Systems  There were no vitals taken for this visit.There is no height or weight on file to calculate BMI.  General Appearance: {Appearance:22683}  Eye Contact:  {BHH EYE CONTACT:22684}  Speech:  Clear and Coherent  Volume:  Normal  Mood:  {BHH MOOD:22306}  Affect:  {Affect (PAA):22687}  Thought Process:  Coherent  Orientation:  Full (Time, Place, and Person)  Thought Content:  Logical  Suicidal Thoughts:  {ST/HT (PAA):22692}  Homicidal Thoughts:  {ST/HT (PAA):22692}  Memory:  Immediate;   Good  Judgement:  {Judgement (PAA):22694}  Insight:  {Insight (PAA):22695}  Psychomotor Activity:  Normal  Concentration:  Concentration: Good and Attention Span: Good  Recall:  Good  Fund of Knowledge:Good  Language: Good  Akathisia:  No  Handed:  Right  AIMS (if indicated):  not done  Assets:  Communication Skills Desire for Improvement  ADL's:  Intact  Cognition: WNL  Sleep:  {BHH GOOD/FAIR/POOR:22877}   Screenings:   Assessment and Plan:  Assessment  Plan  The patient demonstrates the following risk factors for suicide: Chronic risk factors for suicide include: {Chronic Risk Factors for OMBTDHR:41638453}. Acute risk factors for suicide include: {Acute Risk Factors for MIWOEHO:12248250}. Protective factors for this patient include: {Protective Factors for Suicide IBBC:48889169}. Considering these factors, the overall suicide risk at this point appears to be {Desc; low/moderate/high:110033}. Patient {ACTION; IS/IS IHW:38882800} appropriate for outpatient follow up.    Neysa Hotter, MD 3/17/20213:58 PM

## 2019-06-27 ENCOUNTER — Ambulatory Visit (HOSPITAL_COMMUNITY): Payer: Medicare Other | Admitting: Psychiatry

## 2019-06-27 ENCOUNTER — Other Ambulatory Visit: Payer: Self-pay

## 2019-06-30 DIAGNOSIS — M545 Low back pain: Secondary | ICD-10-CM | POA: Diagnosis not present

## 2019-06-30 DIAGNOSIS — G43711 Chronic migraine without aura, intractable, with status migrainosus: Secondary | ICD-10-CM | POA: Diagnosis not present

## 2019-06-30 DIAGNOSIS — M25569 Pain in unspecified knee: Secondary | ICD-10-CM | POA: Diagnosis not present

## 2019-06-30 DIAGNOSIS — G4700 Insomnia, unspecified: Secondary | ICD-10-CM | POA: Diagnosis not present

## 2019-06-30 DIAGNOSIS — M25519 Pain in unspecified shoulder: Secondary | ICD-10-CM | POA: Diagnosis not present

## 2019-07-03 NOTE — Progress Notes (Deleted)
Office Visit Note  Patient: Amanda Hammond             Date of Birth: 10/20/1970           MRN: [id number]             PCP: Leeanne Rio, MD Referring: Leeanne Rio, MD Visit Date: 07/08/2019 Occupation: @GUAROCC @  Subjective:  No chief complaint on file.   History of Present Illness: Amanda Hammond is a 49 y.o. female ***   Activities of Daily Living:  Patient reports morning stiffness for *** {minute/hour:19697}.   Patient {ACTIONS;DENIES/REPORTS:21021675::"Denies"} nocturnal pain.  Difficulty dressing/grooming: {ACTIONS;DENIES/REPORTS:21021675::"Denies"} Difficulty climbing stairs: {ACTIONS;DENIES/REPORTS:21021675::"Denies"} Difficulty getting out of chair: {ACTIONS;DENIES/REPORTS:21021675::"Denies"} Difficulty using hands for taps, buttons, cutlery, and/or writing: {ACTIONS;DENIES/REPORTS:21021675::"Denies"}  No Rheumatology ROS completed.   PMFS History:  Patient Active Problem List   Diagnosis Date Noted  . Rheumatoid factor positive 11/06/2016  . Trochanteric bursitis of both hips 11/06/2016  . History of fibromyalgia 11/06/2016  . DDD (degenerative disc disease), cervical 11/06/2016  . Carpal tunnel syndrome, left upper limb 11/06/2016  . Carpal tunnel syndrome, right upper limb 11/06/2016  . Trigger finger, left middle finger 11/06/2016  . Major depressive disorder, recurrent episode, moderate ([place]) 04/27/2016  . Pain in left hand 04/25/2016  . Pain in right hand 04/25/2016  . Primary osteoarthritis of both hands 04/25/2016  . Primary osteoarthritis of both knees 04/25/2016  . Primary osteoarthritis of both feet 04/25/2016  . Fibromyalgia 02/17/2016  . Other insomnia 02/17/2016  . Fatigue 02/17/2016  . B12 DEFICIENCY 02/01/2010  . Vitamin D deficiency 02/01/2010  . ANEMIA, IRON DEFICIENCY 12/30/2009  . OTHER DYSPNEA AND RESPIRATORY ABNORMALITIES 12/30/2009  . Nonspecific (abnormal) findings on radiological and other examination of body  structure 12/30/2009  . COMPUTERIZED TOMOGRAPHY, CHEST, ABNORMAL 12/30/2009    Past Medical History:  Diagnosis Date  . Anemia   . Fibromyositis   . Hypertension   . Osteoarthritis   . PONV (postoperative nausea and vomiting)   . Vitamin D deficiency     Family History  Problem Relation Age of Onset  . Diabetes Mother   . Hypertension Mother   . Hypertension Father   . Hypertension Brother   . Osteoarthritis Brother   . Hypertension Brother   . Osteoarthritis Brother   . Endometriosis Daughter   . Anesthesia problems Neg Hx   . Hypotension Neg Hx   . Pseudochol deficiency Neg Hx   . Malignant hyperthermia Neg Hx    Past Surgical History:  Procedure Laterality Date  . APPENDECTOMY  2010   [facility]  . CARPAL TUNNEL RELEASE Left   . CHOLECYSTECTOMY  1999   [facility], [place]  . GASTRIC BYPASS  2003   [facility]  . HERNIA REPAIR  08/2010, 02/2010    incisional, [facility], [place]  . INCISIONAL HERNIA REPAIR  10/12/2010   Procedure: HERNIA REPAIR INCISIONAL;  Surgeon: Jamesetta So;  Location: [facility];  Service: General;  Laterality: N/A;  Recurrent Incisional Hernia Repair with Mesh  . KNEE SURGERY Right 01/2018   meniscal tear   . PANNICULECTOMY  2008   [facility]  . SHOULDER SURGERY Left   . TENNIS ELBOW RELEASE/NIRSCHEL PROCEDURE Bilateral    Social History   Social History Narrative  . Not on file    There is no immunization history on file for this patient.   Objective: Vital Signs: There were no vitals taken for this visit.   Physical Exam   Musculoskeletal Exam: ***  CDAI Exam: CDAI Score: -- Patient Global: --; Provider Global: -- Swollen: --; Tender: -- Joint Exam 07/08/2019   No joint exam has been documented for this visit   There is currently no information documented on the homunculus. Go to the Rheumatology activity and complete the homunculus joint exam.  Investigation: No additional findings.  Imaging: XR Shoulder Left  Result Date: 06/05/2019 No glenohumeral  joint space narrowing was noted.  Acromioclavicular joint arthritis was noted.  No chondrocalcinosis was noted. Impression: These findings are consistent with acromioclavicular joint arthritis.   Recent Labs: Lab Results  Component Value Date   WBC 7.1 06/05/2019   HGB 9.9 (L) 06/05/2019   PLT 385 06/05/2019   NA 139 06/05/2019   K 4.2 06/05/2019   CL 104 06/05/2019   CO2 28 06/05/2019   GLUCOSE 84 06/05/2019   BUN 25 06/05/2019   CREATININE 1.20 (H) 06/05/2019   BILITOT 0.3 06/05/2019   ALKPHOS 84 12/05/2009   AST 13 06/05/2019   ALT 11 06/05/2019   PROT 6.5 06/05/2019   ALBUMIN 3.1 (L) 12/05/2009   CALCIUM 8.9 06/05/2019   GFRAA 62 06/05/2019    Speciality Comments: No specialty comments available.  Procedures:  No procedures performed Allergies: Patient has no known allergies.   Assessment / Plan:     Visit Diagnoses: No diagnosis found.  Orders: No orders of the defined types were placed in this encounter.  No orders of the defined types were placed in this encounter.   Face-to-face time spent with patient was *** minutes. Greater than 50% of time was spent in counseling and coordination of care.  Follow-Up Instructions: No follow-ups on file.   Gearldine Bienenstock, PA-C  Note - This record has been created using Dragon software.  Chart creation errors have been sought, but may not always  have been located. Such creation errors do not reflect on  the standard of medical care.

## 2019-07-07 ENCOUNTER — Telehealth: Payer: Self-pay | Admitting: Rheumatology

## 2019-07-07 NOTE — Telephone Encounter (Signed)
Patient called to cancel her appointment scheduled for tomorrow 07/08/19.  Patient states she just started a new medication and was due to come back for labwork and follow-up appointment.  Patient states her mother passed away last Friday, 06-Jul-2019 and is not able to keep the appointment due to being emotionally distraught.  Patient states the funeral is scheduled for next week and is too upset to think about anything else right now.  Please advise.

## 2019-07-07 NOTE — Telephone Encounter (Signed)
Attempted to contact the patient and left message for patient to call the office.  

## 2019-07-07 NOTE — Telephone Encounter (Signed)
It is ok for the patient to postpone her appointment.  She can reschedule her appointment for 1 month (8 weeks after starting on PLQ). We do recommend obtaining lab work 1 month after starting on PLQ but she can try to come next week as a walk in for lab work if that will work better with her schedule at this time.

## 2019-07-08 ENCOUNTER — Ambulatory Visit: Payer: Medicare Other | Admitting: Physician Assistant

## 2019-07-09 NOTE — Telephone Encounter (Signed)
Left message to advise patient it is ok for her to postpone her appointment.  She can reschedule her appointment for 1 month (8 weeks after starting on PLQ). Patient advised we do recommend obtaining lab work 1 month after starting on PLQ but she can try to come next week as a walk in for lab work if that will work better with her schedule at this time. Advised of lab hours.

## 2019-09-10 DIAGNOSIS — M25412 Effusion, left shoulder: Secondary | ICD-10-CM | POA: Diagnosis not present

## 2019-09-10 DIAGNOSIS — M25559 Pain in unspecified hip: Secondary | ICD-10-CM | POA: Diagnosis not present

## 2019-09-10 DIAGNOSIS — M25519 Pain in unspecified shoulder: Secondary | ICD-10-CM | POA: Diagnosis not present

## 2019-09-10 DIAGNOSIS — M25569 Pain in unspecified knee: Secondary | ICD-10-CM | POA: Diagnosis not present

## 2019-09-10 DIAGNOSIS — G43711 Chronic migraine without aura, intractable, with status migrainosus: Secondary | ICD-10-CM | POA: Diagnosis not present

## 2019-09-10 DIAGNOSIS — Z79891 Long term (current) use of opiate analgesic: Secondary | ICD-10-CM | POA: Diagnosis not present

## 2019-09-10 DIAGNOSIS — M545 Low back pain: Secondary | ICD-10-CM | POA: Diagnosis not present

## 2019-09-10 DIAGNOSIS — M542 Cervicalgia: Secondary | ICD-10-CM | POA: Diagnosis not present

## 2019-10-16 NOTE — Progress Notes (Deleted)
Office Visit Note  Patient: Amanda Hammond             Date of Birth: 07-Oct-1970           MRN: [id number]             PCP: Suzan Slick, MD Referring: Suzan Slick, MD Visit Date: 10/21/2019 Occupation: @GUAROCC @  Subjective:  No chief complaint on file.   History of Present Illness: Amanda Hammond is a 49 y.o. female ***   Activities of Daily Living:  Patient reports morning stiffness for *** {minute/hour:19697}.   Patient {ACTIONS;DENIES/REPORTS:21021675::"Denies"} nocturnal pain.  Difficulty dressing/grooming: {ACTIONS;DENIES/REPORTS:21021675::"Denies"} Difficulty climbing stairs: {ACTIONS;DENIES/REPORTS:21021675::"Denies"} Difficulty getting out of chair: {ACTIONS;DENIES/REPORTS:21021675::"Denies"} Difficulty using hands for taps, buttons, cutlery, and/or writing: {ACTIONS;DENIES/REPORTS:21021675::"Denies"}  No Rheumatology ROS completed.   PMFS History:  Patient Active Problem List   Diagnosis Date Noted   Rheumatoid factor positive 11/06/2016   Trochanteric bursitis of both hips 11/06/2016   History of fibromyalgia 11/06/2016   DDD (degenerative disc disease), cervical 11/06/2016   Carpal tunnel syndrome, left upper limb 11/06/2016   Carpal tunnel syndrome, right upper limb 11/06/2016   Trigger finger, left middle finger 11/06/2016   Major depressive disorder, recurrent episode, moderate ([facility]) 04/27/2016   Pain in left hand 04/25/2016   Pain in right hand 04/25/2016   Primary osteoarthritis of both hands 04/25/2016   Primary osteoarthritis of both knees 04/25/2016   Primary osteoarthritis of both feet 04/25/2016   Fibromyalgia 02/17/2016   Other insomnia 02/17/2016   Fatigue 02/17/2016   B12 DEFICIENCY 02/01/2010   Vitamin D deficiency 02/01/2010   ANEMIA, IRON DEFICIENCY 12/30/2009   OTHER DYSPNEA AND RESPIRATORY ABNORMALITIES 12/30/2009   Nonspecific (abnormal) findings on radiological and other examination of body  structure 12/30/2009   COMPUTERIZED TOMOGRAPHY, CHEST, ABNORMAL 12/30/2009    Past Medical History:  Diagnosis Date   Anemia    Fibromyositis    Hypertension    Osteoarthritis    PONV (postoperative nausea and vomiting)    Vitamin D deficiency     Family History  Problem Relation Age of Onset   Diabetes Mother    Hypertension Mother    Hypertension Father    Hypertension Brother    Osteoarthritis Brother    Hypertension Brother    Osteoarthritis Brother    Endometriosis Daughter    Anesthesia problems Neg Hx    Hypotension Neg Hx    Pseudochol deficiency Neg Hx    Malignant hyperthermia Neg Hx    Past Surgical History:  Procedure Laterality Date   APPENDECTOMY  2010   [facility]   CARPAL TUNNEL RELEASE Left    CHOLECYSTECTOMY  1999   [facility], [facility]   GASTRIC BYPASS  2003   [facility]   HERNIA REPAIR  08/2010, 02/2010    incisional, [facility], [facility]   INCISIONAL HERNIA REPAIR  10/12/2010   Procedure: HERNIA REPAIR INCISIONAL;  Surgeon: 12/13/2010;  Location: [facility];  Service: General;  Laterality: N/A;  Recurrent Incisional Hernia Repair with Mesh   KNEE SURGERY Right 01/2018   meniscal tear    PANNICULECTOMY  2008   [facility]   SHOULDER SURGERY Left    TENNIS ELBOW RELEASE/NIRSCHEL PROCEDURE Bilateral    Social History   Social History Narrative   Not on file    There is no immunization history on file for this patient.   Objective: Vital Signs: There were no vitals taken for this visit.   Physical Exam   Musculoskeletal Exam: ***  CDAI Exam: CDAI Score: -- Patient Global: --; Provider Global: -- Swollen: --; Tender: -- Joint Exam 10/21/2019   No joint exam has been documented for this visit   There is currently no information documented on the homunculus. Go to the Rheumatology activity and complete the homunculus joint exam.  Investigation: No additional findings.  Imaging: No results found.  Recent Labs: Lab Results  Component  Value Date   WBC 7.1 06/05/2019   HGB 9.9 (L) 06/05/2019   PLT 385 06/05/2019   NA 139 06/05/2019   K 4.2 06/05/2019   CL 104 06/05/2019   CO2 28 06/05/2019   GLUCOSE 84 06/05/2019   BUN 25 06/05/2019   CREATININE 1.20 (H) 06/05/2019   BILITOT 0.3 06/05/2019   ALKPHOS 84 12/05/2009   AST 13 06/05/2019   ALT 11 06/05/2019   PROT 6.5 06/05/2019   ALBUMIN 3.1 (L) 12/05/2009   CALCIUM 8.9 06/05/2019   GFRAA 62 06/05/2019    Speciality Comments: No specialty comments available.  Procedures:  No procedures performed Allergies: Patient has no known allergies.   Assessment / Plan:     Visit Diagnoses: No diagnosis found.  Orders: No orders of the defined types were placed in this encounter.  No orders of the defined types were placed in this encounter.   Face-to-face time spent with patient was *** minutes. Greater than 50% of time was spent in counseling and coordination of care.  Follow-Up Instructions: No follow-ups on file.   Gearldine Bienenstock, PA-C  Note - This record has been created using Dragon software.  Chart creation errors have been sought, but may not always  have been located. Such creation errors do not reflect on  the standard of medical care.

## 2019-10-21 ENCOUNTER — Ambulatory Visit: Payer: Medicare Other | Admitting: Physician Assistant

## 2019-10-23 ENCOUNTER — Ambulatory Visit: Payer: Medicare Other | Admitting: Rheumatology

## 2019-10-27 ENCOUNTER — Ambulatory Visit: Payer: Self-pay

## 2019-10-27 ENCOUNTER — Encounter: Payer: Self-pay | Admitting: Rheumatology

## 2019-10-27 ENCOUNTER — Other Ambulatory Visit: Payer: Self-pay

## 2019-10-27 ENCOUNTER — Telehealth: Payer: Self-pay

## 2019-10-27 ENCOUNTER — Ambulatory Visit: Payer: Medicare Other | Admitting: Rheumatology

## 2019-10-27 VITALS — BP 116/75 | HR 74 | Resp 18 | Ht 64.0 in | Wt 274.4 lb

## 2019-10-27 DIAGNOSIS — F331 Major depressive disorder, recurrent, moderate: Secondary | ICD-10-CM

## 2019-10-27 DIAGNOSIS — M1990 Unspecified osteoarthritis, unspecified site: Secondary | ICD-10-CM

## 2019-10-27 DIAGNOSIS — M79642 Pain in left hand: Secondary | ICD-10-CM

## 2019-10-27 DIAGNOSIS — R5383 Other fatigue: Secondary | ICD-10-CM | POA: Diagnosis not present

## 2019-10-27 DIAGNOSIS — M5030 Other cervical disc degeneration, unspecified cervical region: Secondary | ICD-10-CM

## 2019-10-27 DIAGNOSIS — E559 Vitamin D deficiency, unspecified: Secondary | ICD-10-CM

## 2019-10-27 DIAGNOSIS — M79641 Pain in right hand: Secondary | ICD-10-CM

## 2019-10-27 DIAGNOSIS — M19042 Primary osteoarthritis, left hand: Secondary | ICD-10-CM

## 2019-10-27 DIAGNOSIS — M19041 Primary osteoarthritis, right hand: Secondary | ICD-10-CM | POA: Diagnosis not present

## 2019-10-27 DIAGNOSIS — M79672 Pain in left foot: Secondary | ICD-10-CM

## 2019-10-27 DIAGNOSIS — M79671 Pain in right foot: Secondary | ICD-10-CM | POA: Diagnosis not present

## 2019-10-27 DIAGNOSIS — M7061 Trochanteric bursitis, right hip: Secondary | ICD-10-CM

## 2019-10-27 DIAGNOSIS — M170 Bilateral primary osteoarthritis of knee: Secondary | ICD-10-CM

## 2019-10-27 DIAGNOSIS — G4709 Other insomnia: Secondary | ICD-10-CM | POA: Diagnosis not present

## 2019-10-27 DIAGNOSIS — M19071 Primary osteoarthritis, right ankle and foot: Secondary | ICD-10-CM

## 2019-10-27 DIAGNOSIS — Z79899 Other long term (current) drug therapy: Secondary | ICD-10-CM | POA: Diagnosis not present

## 2019-10-27 DIAGNOSIS — M797 Fibromyalgia: Secondary | ICD-10-CM

## 2019-10-27 DIAGNOSIS — M7062 Trochanteric bursitis, left hip: Secondary | ICD-10-CM

## 2019-10-27 DIAGNOSIS — M25461 Effusion, right knee: Secondary | ICD-10-CM

## 2019-10-27 DIAGNOSIS — G5601 Carpal tunnel syndrome, right upper limb: Secondary | ICD-10-CM

## 2019-10-27 DIAGNOSIS — Z9225 Personal history of immunosupression therapy: Secondary | ICD-10-CM | POA: Diagnosis not present

## 2019-10-27 DIAGNOSIS — M1380 Other specified arthritis, unspecified site: Secondary | ICD-10-CM

## 2019-10-27 DIAGNOSIS — M19072 Primary osteoarthritis, left ankle and foot: Secondary | ICD-10-CM

## 2019-10-27 MED ORDER — PREDNISONE 5 MG PO TABS
ORAL_TABLET | ORAL | 0 refills | Status: DC
Start: 2019-10-27 — End: 2020-05-03

## 2019-10-27 NOTE — Patient Instructions (Addendum)
Standing Labs We placed an order today for your standing lab work.   Please have your standing labs drawn in 2 weeks and then 2 months after starting arava.   If possible, please have your labs drawn 2 weeks prior to your appointment so that the provider can discuss your results at your appointment.  We have open lab daily Monday through Thursday from 8:30-12:30 PM and 1:30-4:30 PM and Friday from 8:30-12:30 PM and 1:30-4:00 PM at the office of Dr. Pollyann Savoy, [facility].   Please be advised, patients with office appointments requiring lab work will take precedents over walk-in lab work.  If possible, please come for your lab work on Monday and Friday afternoons, as you may experience shorter wait times. The office is located at [address], [address], [place], [place] [address] No appointment is necessary.   Labs are drawn by [facility]. Please bring your co-pay at the time of your lab draw.  You may receive a bill from [facility] for your lab work.  If you wish to have your labs drawn at another location, please call the office 24 hours in advance to send orders.  If you have any questions regarding directions or hours of operation,  please call [phone].   As a reminder, please drink plenty of water prior to coming for your lab work. Thanks!   Leflunomide tablets What is this medicine? LEFLUNOMIDE (le FLOO na mide) is for rheumatoid arthritis. This medicine may be used for other purposes; ask your health care provider or pharmacist if you have questions. COMMON BRAND NAME(S): Arava What should I tell my health care provider before I take this medicine? They need to know if you have any of these conditions:  diabetes  have a fever or infection  high blood pressure  immune system problems  kidney disease  liver disease  low blood cell counts, like low white cell, platelet, or red cell counts  lung or breathing disease, like asthma  recently received or  scheduled to receive a vaccine  receiving treatment for cancer  skin conditions or sensitivity  tingling of the fingers or toes, or other nerve disorder  tuberculosis  an unusual or allergic reaction to leflunomide, teriflunomide, other medicines, food, dyes, or preservatives  pregnant or trying to get pregnant  breast-feeding How should I use this medicine? Take this medicine by mouth with a full glass of water. Follow the directions on the prescription label. Take your medicine at regular intervals. Do not take your medicine more often than directed. Do not stop taking except on your doctor's advice. Talk to your pediatrician regarding the use of this medicine in children. Special care may be needed. Overdosage: If you think you have taken too much of this medicine contact a poison control center or emergency room at once. NOTE: This medicine is only for you. Do not share this medicine with others. What if I miss a dose? If you miss a dose, take it as soon as you can. If it is almost time for your next dose, take only that dose. Do not take double or extra doses. What may interact with this medicine? Do not take this medicine with any of the following medications:  teriflunomide This medicine may also interact with the following medications:  alosetron  birth control pills  caffeine  cefaclor  certain medicines for diabetes like nateglinide, repaglinide, rosiglitazone, pioglitazone  certain medicines for high cholesterol like atorvastatin, pravastatin, rosuvastatin, simvastatin  charcoal  cholestyramine  ciprofloxacin  duloxetine  furosemide  ketoprofen  live virus vaccines  medicines that increase your risk for infection  methotrexate  mitoxantrone  paclitaxel  penicillin  theophylline  tizanidine  warfarin This list may not describe all possible interactions. Give your health care provider a list of all the medicines, herbs, non-prescription  drugs, or dietary supplements you use. Also tell them if you smoke, drink alcohol, or use illegal drugs. Some items may interact with your medicine. What should I watch for while using this medicine? Visit your health care provider for regular checks on your progress. Tell your doctor or health care provider if your symptoms do not start to get better or if they get worse. You may need blood work done while you are taking this medicine. This medicine may cause serious skin reactions. They can happen weeks to months after starting the medicine. Contact your health care provider right away if you notice fevers or flu-like symptoms with a rash. The rash may be red or purple and then turn into blisters or peeling of the skin. Or, you might notice a red rash with swelling of the face, lips or lymph nodes in your neck or under your arms. This medicine may stay in your body for up to 2 years after your last dose. Tell your doctor about any unusual side effects or symptoms. A medicine can be given to help lower your blood levels of this medicine more quickly. Women must use effective birth control with this medicine. There is a potential for serious side effects to an unborn child. Do not become pregnant while taking this medicine. Inform your doctor if you wish to become pregnant. This medicine remains in your blood after you stop taking it. You must continue using effective birth control until the blood levels have been checked and they are low enough. A medicine can be given to help lower your blood levels of this medicine more quickly. Immediately talk to your doctor if you think you may be pregnant. You may need a pregnancy test. Talk to your health care provider or pharmacist for more information. You should not receive certain vaccines during your treatment and for a certain time after your treatment with this medication ends. Talk to your health care provider for more information. What side effects may I  notice from receiving this medicine? Side effects that you should report to your doctor or health care professional as soon as possible:  allergic reactions like skin rash, itching or hives, swelling of the face, lips, or tongue  breathing problems  cough  increased blood pressure  low blood counts - this medicine may decrease the number of white blood cells and platelets. You may be at increased risk for infections and bleeding.  pain, tingling, numbness in the hands or feet  rash, fever, and swollen lymph nodes  redness, blistering, peeing or loosening of the skin, including inside the mouth  signs of decreased platelets or bleeding - bruising, pinpoint red spots on the skin, black, tarry stools, blood in urine  signs of infection - fever or chills, cough, sore throat, pain or trouble passing urine  signs and symptoms of liver injury like dark yellow or brown urine; general ill feeling or flu-like symptoms; light-colored stools; loss of appetite; nausea; right upper belly pain; unusually weak or tired; yellowing of the eyes or skin  trouble passing urine or change in the amount of urine  vomiting Side effects that usually do not require medical attention (report  to your doctor or health care professional if they continue or are bothersome):  diarrhea  hair thinning or loss  headache  nausea  tiredness This list may not describe all possible side effects. Call your doctor for medical advice about side effects. You may report side effects to FDA at 1-800-FDA-1088. Where should I keep my medicine? Keep out of the reach of children. Store at room temperature between 15 and 30 degrees C (59 and 86 degrees F). Protect from moisture and light. Throw away any unused medicine after the expiration date. NOTE: This sheet is a summary. It may not cover all possible information. If you have questions about this medicine, talk to your doctor, pharmacist, or health care provider.   2020 [employer] (2018-06-21 15:06:48)

## 2019-10-27 NOTE — Telephone Encounter (Signed)
Pending lab results, patient will be starting arava, per Dr. Corliss Skains. Thanks!   Consent obtained and sent to the scan center.

## 2019-10-27 NOTE — Progress Notes (Signed)
Office Visit Note  Patient: Amanda Hammond             Date of Birth: 1971/03/14           MRN: [id number]             PCP: Suzan Slick, MD Referring: Suzan Slick, MD Visit Date: 10/27/2019 Occupation: @GUAROCC @  Subjective:  Medication monitoring   History of Present Illness: Amanda Hammond is a 49 y.o. female with history of chronic inflammatory arthritis and fibromyalgia.  After the patient's last office visit on 06/05/2019 she started taking Plaquenil 200 mg 1 tablet by mouth twice daily.  She took Plaquenil for 1 month and discontinued due to her mother passing away at the beginning of April.  She states that she was tolerating Plaquenil and did not have any side effects.  She states over the past several months her depression has been worsening and she has been under tremendous amount of stress.  She states that she is having increased pain and inflammation in multiple joints including both wrists, both hands, and the right knee joint.  She is also having some discomfort in both ankle joints.  She states she plans on following up with Dr. Case to discuss a right knee replacement in the future.   Activities of Daily Living:  Patient reports joint stiffness all day  Patient Reports nocturnal pain.  Difficulty dressing/grooming: Reports Difficulty climbing stairs: Reports Difficulty getting out of chair: Reports Difficulty using hands for taps, buttons, cutlery, and/or writing: Reports  Review of Systems  Constitutional: Positive for fatigue.  HENT: Positive for mouth sores, mouth dryness and nose dryness.   Eyes: Negative for itching and dryness.  Respiratory: Positive for shortness of breath. Negative for difficulty breathing.   Cardiovascular: Negative for chest pain and palpitations.  Gastrointestinal: Negative for blood in stool, constipation and diarrhea.  Endocrine: Negative for increased urination.  Genitourinary: Negative for difficulty urinating.    Musculoskeletal: Positive for arthralgias, joint pain, joint swelling, myalgias, morning stiffness, muscle tenderness and myalgias.  Skin: Positive for color change. Negative for rash and redness.  Allergic/Immunologic: Negative for susceptible to infections.  Neurological: Positive for numbness and weakness. Negative for dizziness, headaches and memory loss.  Hematological: Negative for bruising/bleeding tendency.  Psychiatric/Behavioral: Negative for confusion.    PMFS History:  Patient Active Problem List   Diagnosis Date Noted  . Rheumatoid factor positive 11/06/2016  . Trochanteric bursitis of both hips 11/06/2016  . History of fibromyalgia 11/06/2016  . DDD (degenerative disc disease), cervical 11/06/2016  . Carpal tunnel syndrome, left upper limb 11/06/2016  . Carpal tunnel syndrome, right upper limb 11/06/2016  . Trigger finger, left middle finger 11/06/2016  . Major depressive disorder, recurrent episode, moderate (HCC) 04/27/2016  . Pain in left hand 04/25/2016  . Pain in right hand 04/25/2016  . Primary osteoarthritis of both hands 04/25/2016  . Primary osteoarthritis of both knees 04/25/2016  . Primary osteoarthritis of both feet 04/25/2016  . Fibromyalgia 02/17/2016  . Other insomnia 02/17/2016  . Fatigue 02/17/2016  . B12 DEFICIENCY 02/01/2010  . Vitamin D deficiency 02/01/2010  . ANEMIA, IRON DEFICIENCY 12/30/2009  . OTHER DYSPNEA AND RESPIRATORY ABNORMALITIES 12/30/2009  . Nonspecific (abnormal) findings on radiological and other examination of body structure 12/30/2009  . COMPUTERIZED TOMOGRAPHY, CHEST, ABNORMAL 12/30/2009    Past Medical History:  Diagnosis Date  . Anemia   . Fibromyositis   . Hypertension   . Osteoarthritis   .  PONV (postoperative nausea and vomiting)   . Vitamin D deficiency     Family History  Problem Relation Age of Onset  . Diabetes Mother   . Hypertension Mother   . Hypertension Father   . Hypertension Brother   .  Osteoarthritis Brother   . Hypertension Brother   . Osteoarthritis Brother   . Endometriosis Daughter   . Anesthesia problems Neg Hx   . Hypotension Neg Hx   . Pseudochol deficiency Neg Hx   . Malignant hyperthermia Neg Hx    Past Surgical History:  Procedure Laterality Date  . APPENDECTOMY  2010   [facility]  . CARPAL TUNNEL RELEASE Left   . CHOLECYSTECTOMY  1999   lap, [facility]  . GASTRIC BYPASS  2003   [facility]  . HERNIA REPAIR  08/2010, 02/2010    incisional, [facility], [facility]  . INCISIONAL HERNIA REPAIR  10/12/2010   Procedure: HERNIA REPAIR INCISIONAL;  Surgeon: Dalia Heading;  Location: [facility];  Service: General;  Laterality: N/A;  Recurrent Incisional Hernia Repair with Mesh  . KNEE SURGERY Right 01/2018   meniscal tear   . PANNICULECTOMY  2008   [facility]  . SHOULDER SURGERY Left   . TENNIS ELBOW RELEASE/NIRSCHEL PROCEDURE Bilateral    Social History   Social History Narrative  . Not on file    There is no immunization history on file for this patient.   Objective: Vital Signs: BP 116/75 (BP Location: Right Arm, Patient Position: Sitting, Cuff Size: Normal)   Pulse 74   Resp 18   Ht  (1.626 m)   Wt (!) 274 lb 6.4 oz (124.5 kg)   BMI 47.10 kg/m    Physical Exam Vitals and nursing note reviewed.  Constitutional:      Appearance: She is well-developed.  HENT:     Head: Normocephalic and atraumatic.  Eyes:     Conjunctiva/sclera: Conjunctivae normal.  Pulmonary:     Effort: Pulmonary effort is normal.  Abdominal:     General: Bowel sounds are normal.     Palpations: Abdomen is soft.  Musculoskeletal:     Cervical back: Normal range of motion.  Lymphadenopathy:     Cervical: No cervical adenopathy.  Skin:    General: Skin is warm and dry.     Capillary Refill: Capillary refill takes less than 2 seconds.  Neurological:     Mental Status: She is alert and oriented to person, place, and time.  Psychiatric:        Behavior: Behavior normal.      Musculoskeletal Exam:  Generalized hyperalgesia and positive tender points on exam. C-spine limited ROM. Shoulder joints and elbow joint have good ROM with no discomfort.  Tenderness and synovitis of both wrist joints noted.  Tenderness and synovitis of the right 2nd and 3rd MCP joints and left 2nd MCP joint.  Tenderness and synovitis of the right 5th PIP joint.  Hip joints good ROM with discomfort bilaterally.  Warmth, effusion, and painful ROM of the right knee joint.  No warmth or effusion of the left knee joint. Tenderness of both ankle joints.  Pitting edema noted bilaterally.   CDAI Exam: CDAI Score: -- Patient Global: --; Provider Global: -- Swollen: 7 ; Tender: 7  Joint Exam 10/27/2019      Right  Left  Wrist  Swollen Tender  Swollen Tender  MCP 2  Swollen Tender  Swollen Tender  MCP 3  Swollen Tender     PIP 5  Swollen  Tender     Knee  Swollen Tender        Investigation: No additional findings.  Imaging: XR Foot 2 Views Left  Result Date: 10/27/2019 First MTP, PIP and DIP narrowing was noted.  No intertarsal or tibiotalar joint space narrowing was noted.  Inferior calcaneal spur was noted.  Dorsal spurring was noted.  No erosive changes were noted.  Juxta-articular osteopenia was noted. Impression: These findings are consistent with rheumatoid arthritis and osteoarthritis overlap.  XR Foot 2 Views Right  Result Date: 10/27/2019 First MTP, PIP and DIP narrowing was noted.  Cystic changes were noted in the first metatarsal.  No intertarsal or tibiotalar joint space narrowing was noted.  Inferior calcaneal spur was noted.  Juxta-articular osteopenia was noted. Impression: These findings are consistent with rheumatoid arthritis and osteoarthritis overlap.  XR Hand 2 View Left  Result Date: 10/27/2019 Juxta-articular osteopenia was noted.  PIP and DIP narrowing was noted.  CMC narrowing was noted.  No intercarpal radiocarpal joint space narrowing was noted.  No erosive changes were noted.  No  radiographic progression was noted when compared to the x-rays of September 16, 2018. Impression: These findings are consistent with rheumatoid arthritis and osteoarthritis overlap.  XR Hand 2 View Right  Result Date: 10/27/2019 Juxta-articular osteopenia was noted.  Soft tissue swelling was noted around the second and third MCP joints.  PIP and DIP narrowing was noted.  No intercarpal radiocarpal joint space narrowing was noted.  Ulnar styloid erosion was noted.  The ulnar styloid erosion was new when compared to the x-rays of September 16, 2018. Impression: These findings are consistent with erosive rheumatoid arthritis.   Recent Labs: Lab Results  Component Value Date   WBC 7.1 06/05/2019   HGB 9.9 (L) 06/05/2019   PLT 385 06/05/2019   NA 139 06/05/2019   K 4.2 06/05/2019   CL 104 06/05/2019   CO2 28 06/05/2019   GLUCOSE 84 06/05/2019   BUN 25 06/05/2019   CREATININE 1.20 (H) 06/05/2019   BILITOT 0.3 06/05/2019   ALKPHOS 84 12/05/2009   AST 13 06/05/2019   ALT 11 06/05/2019   PROT 6.5 06/05/2019   ALBUMIN 3.1 (L) 12/05/2009   CALCIUM 8.9 06/05/2019   GFRAA 62 06/05/2019    Speciality Comments: No specialty comments available.  Procedures:  No procedures performed Allergies: Patient has no known allergies.   Assessment / Plan:     Visit Diagnoses: Chronic inflammatory arthritis: RF negative, 14 3 3  eta negative, anti-CCP negative: Patient presents today with pain and inflammation of multiple joints.  She has tenderness and synovitis of both wrist joints, right second and third MCPs, left second MCP, and right fifth PIP joint.  She also has warmth and swelling of the right knee joint on exam.  She has been having difficulty ambulating due to the severity of pain.  She is also been experiencing increased nocturnal pain.  After her last office visit on 06/05/2019 she was started on Plaquenil 200 mg 1 tablet by mouth twice daily which she took for 1 month.  According to the patient she  tolerated Plaquenil without any side effects but discontinued due to her mother passing away in April 2021.  She has been under a tremendous amount of stress over the past several months.  Due to the severity of pain and inflammation she is experiencing we do not feel that Plaquenil be effective at managing her arthritis.  We discussed starting her on Arava 20 mg 1 tablet  by mouth daily.  Indications, contraindications, and potential side effects of Arava were discussed today.  All questions were addressed and consent was obtained.  We will obtain baseline immunosuppressive labs.  Prescription for Ranae Plumber is pending lab results.  We discussed that raised the may not be effective at controlling her arthritis we may need to add an additional medication as combination therapy.  She was given a handout of information about Humira to review and we will further discuss at her follow-up visit if necessary. We will also be updating x-rays of hands and feet today.  We will send in a prednisone taper starting at 20 mg tapering by 5 mg every week.  She will follow-up in the office in 1 month.  Medication counseling:   Baseline Immunosuppressant Therapy Labs Patient was counseled on the purpose, proper use, and adverse effects of leflunomide including risk of infection, nausea/diarrhea/weight loss, increase in blood pressure, rash, hair loss, tingling in the hands and feet, and signs and symptoms of interstitial lung disease.   Also counseled on Black Box warning of liver injury and importance of avoiding alcohol while on therapy. Discussed that there is the possibility of an increased risk of malignancy but it is not well understood if this increased risk is due to the medication or the disease state.  Counseled patient to avoid live vaccines. Recommend annual influenza, Pneumovax 23, Prevnar 13, and Shingrix as indicated.   Discussed the importance of frequent monitoring of liver function and blood count.  Standing  orders placed.  Discussed importance of birth control while on leflunomide due to risk of congenital abnormalities.   Provided patient with educational materials on leflunomide and answered all questions.  Patient consented to [place] use, and consent will be uploaded into the media tab.   Patient dose will be 20 mg 1 tablet by mouth daily.  Prescription pending lab results and/or insurance approval.  Counseled patient that Humira is a TNF blocking agent.  Counseled patient on purpose, proper use, and adverse effects of Humira.    High risk medication use -She we will be starting on Arava 20 mg 1 tablet by mouth daily. She previously took Plaquenil for 1 month but discontinued due to not updating lab work and then grieving the loss of her mother.  Plaquenil will not adequately control her arthritis she will not be restarting at this time.  She will return for lab work in 2 weeks and 2 months then every 3 months to monitor for drug toxicity.  Standing orders for CBC and CMP will be placed today.  We will also obtain baseline immunosuppressive labs.  She may require more aggressive treatment with combination therapy in the future.  She was given a handout about Humira to also review.- Plan: CBC with Differential/Platelet, COMPLETE METABOLIC PANEL WITH GFR, HIV Antibody (routine testing w rflx), QuantiFERON-TB Gold Plus, Serum protein electrophoresis with reflex, IgG, IgA, IgM, Hepatitis panel, acute  Pain in both hands - She presents today with increased pain and inflammation of both wrist joints and both hands.  She has difficulty making a complete fist due to the discomfort.  X-rays of both hands were obtained on 10/16/2018 and were reviewed with the patient today in the office.  At that time she had PIP and DIP narrowing but no MCP joint narrowing or erosive changes were noted.  X-rays of both hands were updated again today.  She will be starting on a prednisone taper starting 20 mg tapering by 5 mg every  7  days.  She will also be starting on Arava as discussed above.  Plan: XR Hand 2 View Right, XR Hand 2 View Left  Pain in both feet - She has been experiencing increased pain and stiffness in both feet.  She has tenderness of both ankle joints on exam.  Pitting edema was noted.  X-rays of both feet were obtained today.  Plan: XR Foot 2 Views Left, XR Foot 2 Views Right  Fibromyalgia: She has generalized hyperalgesia and positive tender points on exam.  She has been having frequent and severe fibromyalgia flares.  She has been under tremendous amount of stress and has had worsening depression since her mother passing away in April 2021.  Her fatigue and insomnia have also been worsening.  She continues to take Cymbalta 90 mg by mouth daily, Wellbutrin 300 mg daily, Lyrica 200 mg 3 times daily, and tizanidine 2 to 4 mg by mouth every 8 hours as needed for muscle spasms.  We discussed importance of regular exercise and good sleep hygiene.  She said difficulty exercising due to the severity of pain she has been experiencing.  Other insomnia: She has been having increased difficulty sleeping at night.  She has had worsening nocturnal pain and has been under tremendous amount of stress which has worsened her insomnia.  She is no longer taking Ambien at bedtime.  She takes Zanaflex 2 to 4 mg by mouth every 8 hours as needed for muscle spasms.  Other fatigue: Chronic and severe currently.   DDD (degenerative disc disease), cervical: She has painful ROM of the C-spine on exam. She has trapezius muscle tension and tenderness bilaterally.  No symptoms of radiculopathy.   Primary osteoarthritis of both hands: PIP and DIP thickening consistent with osteoarthritis of both hands.  Difficulty making a complete fist bilaterally.    Primary osteoarthritis of both knees: She has chronic right knee joint pain and inflammation.  She plans on scheduling an appointment with Dr. Case to discuss proceeding with a right knee  replacement in the future.   Effusion, right knee: She has chronic severe pain in the right knee joint.  Warmth and an effusion was noted.  She has been having difficulty ambulating due to the discomfort.  She will be starting on a prednisone taper starting at 20 mg tapering by 5 mg every week.   Trochanteric bursitis of both hips: She has tenderness to palpation over bilateral trochanteric bursa.    Primary osteoarthritis of both feet: She has been having increased discomfort in both ankle joints.  Tenderness to palpation noted.  Pitting edema bilaterally.    Vitamin D deficiency  Carpal tunnel syndrome, right upper limb: Asymptomatic at this time.   Major depressive disorder, recurrent episode, moderate (HCC): Her depression has been worsening over the past several months due to grieving over her mother passing away in April 2021. She is taking wellbutrin 300 mg daily and cymbalta 90 mg daily.      Orders: Orders Placed This Encounter  Procedures  . XR Hand 2 View Right  . XR Hand 2 View Left  . XR Foot 2 Views Left  . XR Foot 2 Views Right  . CBC with Differential/Platelet  . COMPLETE METABOLIC PANEL WITH GFR  . HIV Antibody (routine testing w rflx)  . QuantiFERON-TB Gold Plus  . Serum protein electrophoresis with reflex  . IgG, IgA, IgM  . Hepatitis panel, acute   Meds ordered this encounter  Medications  . predniSONE (DELTASONE)  5 MG tablet    Sig: Take 4 tablets by mouth daily x1 week, 3 tablets by mouth daily x1 week, 2 tablets by mouth daily x1 week, 1 tablet by mouth daily x1 week.    Dispense:  70 tablet    Refill:  0    Face-to-face time spent with patient was 30 minutes. Greater than 50% of time was spent in counseling and coordination of care.  Follow-Up Instructions: Return in 4 weeks (on 11/24/2019) for Chronic inflammatory arthritis , Fibromyalgia.   Sherron Ales, PA-C  I examined and evaluated the patient with Sherron Ales PA.  Patient was having a severe  flare of rheumatoid arthritis today.  She has synovitis in multiple joints on examination as described above.  We discussed different treatment options.  She was given a prednisone taper.  We will start her on leflunomide after the lab results are available.  She may need more aggressive therapy.  The plan of care was discussed as noted above.  Pollyann Savoy, MD  Note - This record has been created using Animal nutritionist.  Chart creation errors have been sought, but may not always  have been located. Such creation errors do not reflect on  the standard of medical care.

## 2019-10-28 NOTE — Progress Notes (Signed)
Hgb and hct are low and trending down.  Please notify the patient and forward lab work to PCP.  She will require further workup for the cause of her anemia.    Creatinine is elevated and trending up.  GFR is 48.  Please advise the patient to avoid taking NSAIDs.

## 2019-10-29 DIAGNOSIS — E611 Iron deficiency: Secondary | ICD-10-CM | POA: Diagnosis not present

## 2019-10-29 DIAGNOSIS — E871 Hypo-osmolality and hyponatremia: Secondary | ICD-10-CM | POA: Diagnosis not present

## 2019-10-29 DIAGNOSIS — M2550 Pain in unspecified joint: Secondary | ICD-10-CM | POA: Diagnosis not present

## 2019-10-29 LAB — COMPLETE METABOLIC PANEL WITH GFR
AG Ratio: 1.6 (calc) (ref 1.0–2.5)
ALT: 13 U/L (ref 6–29)
AST: 15 U/L (ref 10–35)
Albumin: 3.9 g/dL (ref 3.6–5.1)
Alkaline phosphatase (APISO): 104 U/L (ref 31–125)
BUN/Creatinine Ratio: 17 (calc) (ref 6–22)
BUN: 22 mg/dL (ref 7–25)
CO2: 25 mmol/L (ref 20–32)
Calcium: 8.7 mg/dL (ref 8.6–10.2)
Chloride: 99 mmol/L (ref 98–110)
Creat: 1.31 mg/dL — ABNORMAL HIGH (ref 0.50–1.10)
GFR, Est African American: 56 mL/min/{1.73_m2} — ABNORMAL LOW (ref >=60)
GFR, Est Non African American: 48 mL/min/{1.73_m2} — ABNORMAL LOW (ref >=60)
Globulin: 2.5 g/dL (calc) (ref 1.9–3.7)
Glucose, Bld: 89 mg/dL (ref 65–99)
Potassium: 3.6 mmol/L (ref 3.5–5.3)
Sodium: 133 mmol/L — ABNORMAL LOW (ref 135–146)
Total Bilirubin: 0.5 mg/dL (ref 0.2–1.2)
Total Protein: 6.4 g/dL (ref 6.1–8.1)

## 2019-10-29 LAB — CBC WITH DIFFERENTIAL/PLATELET
Absolute Monocytes: 428 cells/uL (ref 200–950)
Basophils Absolute: 32 cells/uL (ref 0–200)
Basophils Relative: 0.5 %
Eosinophils Absolute: 120 cells/uL (ref 15–500)
Eosinophils Relative: 1.9 %
HCT: 28.4 % — ABNORMAL LOW (ref 35.0–45.0)
Hemoglobin: 8.6 g/dL — ABNORMAL LOW (ref 11.7–15.5)
Lymphs Abs: 2281 cells/uL (ref 850–3900)
MCH: 22.2 pg — ABNORMAL LOW (ref 27.0–33.0)
MCHC: 30.3 g/dL — ABNORMAL LOW (ref 32.0–36.0)
MCV: 73.4 fL — ABNORMAL LOW (ref 80.0–100.0)
MPV: 10 fL (ref 7.5–12.5)
Monocytes Relative: 6.8 %
Neutro Abs: 3440 cells/uL (ref 1500–7800)
Neutrophils Relative %: 54.6 %
Platelets: 286 10*3/uL (ref 140–400)
RBC: 3.87 10*6/uL (ref 3.80–5.10)
RDW: 16.3 % — ABNORMAL HIGH (ref 11.0–15.0)
Total Lymphocyte: 36.2 %
WBC: 6.3 10*3/uL (ref 3.8–10.8)

## 2019-10-29 LAB — HEPATITIS PANEL, ACUTE
Hep A IgM: NONREACTIVE
Hep B C IgM: NONREACTIVE
Hepatitis B Surface Ag: NONREACTIVE
Hepatitis C Ab: NONREACTIVE
SIGNAL TO CUT-OFF: 0.01 (ref <1.00)

## 2019-10-29 LAB — PROTEIN ELECTROPHORESIS, SERUM, WITH REFLEX
Albumin ELP: 3.5 g/dL — ABNORMAL LOW (ref 3.8–4.8)
Alpha 1: 0.4 g/dL — ABNORMAL HIGH (ref 0.2–0.3)
Alpha 2: 0.9 g/dL (ref 0.5–0.9)
Beta 2: 0.3 g/dL (ref 0.2–0.5)
Beta Globulin: 0.5 g/dL (ref 0.4–0.6)
Gamma Globulin: 0.8 g/dL (ref 0.8–1.7)
Total Protein: 6.4 g/dL (ref 6.1–8.1)

## 2019-10-29 LAB — IGG, IGA, IGM
IgG (Immunoglobin G), Serum: 865 mg/dL (ref 600–1640)
IgM, Serum: 37 mg/dL — ABNORMAL LOW (ref 50–300)
Immunoglobulin A: 203 mg/dL (ref 47–310)

## 2019-10-29 LAB — HIV ANTIBODY (ROUTINE TESTING W REFLEX): HIV 1&2 Ab, 4th Generation: NONREACTIVE

## 2019-10-29 LAB — QUANTIFERON-TB GOLD PLUS
Mitogen-NIL: >10 IU/mL
NIL: 0.03 IU/mL
QuantiFERON-TB Gold Plus: NEGATIVE
TB1-NIL: 0.01 IU/mL
TB2-NIL: 0.01 IU/mL

## 2019-10-29 NOTE — Progress Notes (Signed)
HIV negative. Hepatitis panel negative. SPEP revealed findings consistent with an acute inflammatory pattern.  TB gold negative.  IgM borderline low.  Rest of immunoglobulins WNL.

## 2019-10-30 MED ORDER — LEFLUNOMIDE 20 MG PO TABS: 20.0000 mg | ORAL_TABLET | Freq: Every day | ORAL | 0 refills | Status: DC

## 2019-10-30 NOTE — Telephone Encounter (Signed)
Labs resulted HIV negative. Hepatitis panel negative. SPEP revealed findings consistent with an acute inflammatory pattern. TB gold negative. IgM borderline low. Rest of immunoglobulins WNL.   Per office note on 10/27/2019: Arava 20 mg 1 tablet by mouth daily.

## 2019-11-03 ENCOUNTER — Telehealth: Payer: Self-pay | Admitting: Rheumatology

## 2019-11-03 NOTE — Telephone Encounter (Signed)
Patient calling in reference to COVID vaccine. Patient wants to get that tomorrow. Patient wants to know if she should hold off on starting new medication, and hold off on current Prednisone. Please call to advise.

## 2019-11-03 NOTE — Telephone Encounter (Signed)
I returned patient's call and discussed that there are no recommendations to stop leflunomide for COVID-19 vaccination.  As long as she is taking prednisone less than 20 mg p.o. daily should be okay for her to take the vaccination.  She should not take Tylenol or anti-inflammatories 24 hours prior to the vaccination.

## 2019-11-05 DIAGNOSIS — M25559 Pain in unspecified hip: Secondary | ICD-10-CM | POA: Diagnosis not present

## 2019-11-05 DIAGNOSIS — G43711 Chronic migraine without aura, intractable, with status migrainosus: Secondary | ICD-10-CM | POA: Diagnosis not present

## 2019-11-05 DIAGNOSIS — M25569 Pain in unspecified knee: Secondary | ICD-10-CM | POA: Diagnosis not present

## 2019-11-05 DIAGNOSIS — M25519 Pain in unspecified shoulder: Secondary | ICD-10-CM | POA: Diagnosis not present

## 2019-11-05 DIAGNOSIS — G4700 Insomnia, unspecified: Secondary | ICD-10-CM | POA: Diagnosis not present

## 2019-11-10 NOTE — Progress Notes (Deleted)
Office Visit Note  Patient: Amanda Hammond             Date of Birth: 1970-10-17           MRN: [id number]             PCP: Suzan Slick, MD Referring: Suzan Slick, MD Visit Date: 11/24/2019 Occupation: @GUAROCC @  Subjective:  No chief complaint on file.   History of Present Illness: Amanda Hammond is a 49 y.o. female ***   Activities of Daily Living:  Patient reports morning stiffness for *** {minute/hour:19697}.   Patient {ACTIONS;DENIES/REPORTS:21021675::"Denies"} nocturnal pain.  Difficulty dressing/grooming: {ACTIONS;DENIES/REPORTS:21021675::"Denies"} Difficulty climbing stairs: {ACTIONS;DENIES/REPORTS:21021675::"Denies"} Difficulty getting out of chair: {ACTIONS;DENIES/REPORTS:21021675::"Denies"} Difficulty using hands for taps, buttons, cutlery, and/or writing: {ACTIONS;DENIES/REPORTS:21021675::"Denies"}  No Rheumatology ROS completed.   PMFS History:  Patient Active Problem List   Diagnosis Date Noted  . Rheumatoid factor positive 11/06/2016  . Trochanteric bursitis of both hips 11/06/2016  . History of fibromyalgia 11/06/2016  . DDD (degenerative disc disease), cervical 11/06/2016  . Carpal tunnel syndrome, left upper limb 11/06/2016  . Carpal tunnel syndrome, right upper limb 11/06/2016  . Trigger finger, left middle finger 11/06/2016  . Major depressive disorder, recurrent episode, moderate (HCC) 04/27/2016  . Pain in left hand 04/25/2016  . Pain in right hand 04/25/2016  . Primary osteoarthritis of both hands 04/25/2016  . Primary osteoarthritis of both knees 04/25/2016  . Primary osteoarthritis of both feet 04/25/2016  . Fibromyalgia 02/17/2016  . Other insomnia 02/17/2016  . Fatigue 02/17/2016  . B12 DEFICIENCY 02/01/2010  . Vitamin D deficiency 02/01/2010  . ANEMIA, IRON DEFICIENCY 12/30/2009  . OTHER DYSPNEA AND RESPIRATORY ABNORMALITIES 12/30/2009  . Nonspecific (abnormal) findings on radiological and other examination of body  structure 12/30/2009  . COMPUTERIZED TOMOGRAPHY, CHEST, ABNORMAL 12/30/2009    Past Medical History:  Diagnosis Date  . Anemia   . Fibromyositis   . Hypertension   . Osteoarthritis   . PONV (postoperative nausea and vomiting)   . Vitamin D deficiency     Family History  Problem Relation Age of Onset  . Diabetes Mother   . Hypertension Mother   . Hypertension Father   . Hypertension Brother   . Osteoarthritis Brother   . Hypertension Brother   . Osteoarthritis Brother   . Endometriosis Daughter   . Anesthesia problems Neg Hx   . Hypotension Neg Hx   . Pseudochol deficiency Neg Hx   . Malignant hyperthermia Neg Hx    Past Surgical History:  Procedure Laterality Date  . APPENDECTOMY  2010   [facility]  . CARPAL TUNNEL RELEASE Left   . CHOLECYSTECTOMY  1999   lap, [facility]  . GASTRIC BYPASS  2003   [facility]  . HERNIA REPAIR  08/2010, 02/2010    incisional, [facility], [facility]  . INCISIONAL HERNIA REPAIR  10/12/2010   Procedure: HERNIA REPAIR INCISIONAL;  Surgeon: 12/13/2010;  Location: [facility];  Service: General;  Laterality: N/A;  Recurrent Incisional Hernia Repair with Mesh  . KNEE SURGERY Right 01/2018   meniscal tear   . PANNICULECTOMY  2008   [facility]  . SHOULDER SURGERY Left   . TENNIS ELBOW RELEASE/NIRSCHEL PROCEDURE Bilateral    Social History   Social History Narrative  . Not on file    There is no immunization history on file for this patient.   Objective: Vital Signs: There were no vitals taken for this visit.   Physical Exam   Musculoskeletal Exam: ***  CDAI Exam: CDAI Score: -- Patient Global: --; Provider Global: -- Swollen: --; Tender: -- Joint Exam 11/24/2019   No joint exam has been documented for this visit   There is currently no information documented on the homunculus. Go to the Rheumatology activity and complete the homunculus joint exam.  Investigation: No additional findings.  Imaging: XR Foot 2 Views Left  Result Date: 10/27/2019 First MTP, PIP  and DIP narrowing was noted.  No intertarsal or tibiotalar joint space narrowing was noted.  Inferior calcaneal spur was noted.  Dorsal spurring was noted.  No erosive changes were noted.  Juxta-articular osteopenia was noted. Impression: These findings are consistent with rheumatoid arthritis and osteoarthritis overlap.  XR Foot 2 Views Right  Result Date: 10/27/2019 First MTP, PIP and DIP narrowing was noted.  Cystic changes were noted in the first metatarsal.  No intertarsal or tibiotalar joint space narrowing was noted.  Inferior calcaneal spur was noted.  Juxta-articular osteopenia was noted. Impression: These findings are consistent with rheumatoid arthritis and osteoarthritis overlap.  XR Hand 2 View Left  Result Date: 10/27/2019 Juxta-articular osteopenia was noted.  PIP and DIP narrowing was noted.  CMC narrowing was noted.  No intercarpal radiocarpal joint space narrowing was noted.  No erosive changes were noted.  No radiographic progression was noted when compared to the x-rays of September 16, 2018. Impression: These findings are consistent with rheumatoid arthritis and osteoarthritis overlap.  XR Hand 2 View Right  Result Date: 10/27/2019 Juxta-articular osteopenia was noted.  Soft tissue swelling was noted around the second and third MCP joints.  PIP and DIP narrowing was noted.  No intercarpal radiocarpal joint space narrowing was noted.  Ulnar styloid erosion was noted.  The ulnar styloid erosion was new when compared to the x-rays of September 16, 2018. Impression: These findings are consistent with erosive rheumatoid arthritis.   Recent Labs: Lab Results  Component Value Date   WBC 6.3 10/27/2019   HGB 8.6 (L) 10/27/2019   PLT 286 10/27/2019   NA 133 (L) 10/27/2019   K 3.6 10/27/2019   CL 99 10/27/2019   CO2 25 10/27/2019   GLUCOSE 89 10/27/2019   BUN 22 10/27/2019   CREATININE 1.31 (H) 10/27/2019   BILITOT 0.5 10/27/2019   ALKPHOS 84 12/05/2009   AST 15 10/27/2019   ALT 13  10/27/2019   PROT 6.4 10/27/2019   PROT 6.4 10/27/2019   ALBUMIN 3.1 (L) 12/05/2009   CALCIUM 8.7 10/27/2019   GFRAA 56 (L) 10/27/2019   QFTBGOLDPLUS NEGATIVE 10/27/2019    Speciality Comments: No specialty comments available.  Procedures:  No procedures performed Allergies: Patient has no known allergies.   Assessment / Plan:     Visit Diagnoses: No diagnosis found.  Orders: No orders of the defined types were placed in this encounter.  No orders of the defined types were placed in this encounter.   Face-to-face time spent with patient was *** minutes. Greater than 50% of time was spent in counseling and coordination of care.  Follow-Up Instructions: No follow-ups on file.   Gearldine Bienenstock, PA-C  Note - This record has been created using Dragon software.  Chart creation errors have been sought, but may not always  have been located. Such creation errors do not reflect on  the standard of medical care.

## 2019-11-24 ENCOUNTER — Ambulatory Visit: Payer: Medicare Other | Admitting: Rheumatology

## 2019-11-26 DIAGNOSIS — M25461 Effusion, right knee: Secondary | ICD-10-CM | POA: Diagnosis not present

## 2019-11-26 DIAGNOSIS — M1711 Unilateral primary osteoarthritis, right knee: Secondary | ICD-10-CM | POA: Diagnosis not present

## 2019-11-26 DIAGNOSIS — S83271D Complex tear of lateral meniscus, current injury, right knee, subsequent encounter: Secondary | ICD-10-CM | POA: Diagnosis not present

## 2019-11-26 DIAGNOSIS — M778 Other enthesopathies, not elsewhere classified: Secondary | ICD-10-CM | POA: Diagnosis not present

## 2019-11-28 ENCOUNTER — Other Ambulatory Visit: Payer: Self-pay | Admitting: Rheumatology

## 2019-11-28 MED ORDER — LEFLUNOMIDE 20 MG PO TABS: 20.0000 mg | ORAL_TABLET | Freq: Every day | ORAL | 0 refills | Status: DC

## 2019-11-28 NOTE — Telephone Encounter (Signed)
Patient advised GFR is low.  Patient advised to discontinue Voltaren and ibuprofen. GFR could be low also due to diuretic use.

## 2019-11-28 NOTE — Telephone Encounter (Signed)
Patient calling because she missed her last office visit. Patient rescheduled to Tuesday, 12/02/19. Patient has been out of Arava since yesterday. Please call to advise.

## 2019-11-28 NOTE — Progress Notes (Deleted)
Office Visit Note  Patient: Amanda Hammond             Date of Birth: March 05, 1971           MRN: [id number]             PCP: Suzan Slick, MD Referring: Suzan Slick, MD Visit Date: 12/02/2019 Occupation: @GUAROCC @  Subjective:  No chief complaint on file.   History of Present Illness: Amanda Hammond is a 49 y.o. female ***   Activities of Daily Living:  Patient reports morning stiffness for *** {minute/hour:19697}.   Patient {ACTIONS;DENIES/REPORTS:21021675::"Denies"} nocturnal pain.  Difficulty dressing/grooming: {ACTIONS;DENIES/REPORTS:21021675::"Denies"} Difficulty climbing stairs: {ACTIONS;DENIES/REPORTS:21021675::"Denies"} Difficulty getting out of chair: {ACTIONS;DENIES/REPORTS:21021675::"Denies"} Difficulty using hands for taps, buttons, cutlery, and/or writing: {ACTIONS;DENIES/REPORTS:21021675::"Denies"}  No Rheumatology ROS completed.   PMFS History:  Patient Active Problem List   Diagnosis Date Noted  . Rheumatoid factor positive 11/06/2016  . Trochanteric bursitis of both hips 11/06/2016  . History of fibromyalgia 11/06/2016  . DDD (degenerative disc disease), cervical 11/06/2016  . Carpal tunnel syndrome, left upper limb 11/06/2016  . Carpal tunnel syndrome, right upper limb 11/06/2016  . Trigger finger, left middle finger 11/06/2016  . Major depressive disorder, recurrent episode, moderate (HCC) 04/27/2016  . Pain in left hand 04/25/2016  . Pain in right hand 04/25/2016  . Primary osteoarthritis of both hands 04/25/2016  . Primary osteoarthritis of both knees 04/25/2016  . Primary osteoarthritis of both feet 04/25/2016  . Fibromyalgia 02/17/2016  . Other insomnia 02/17/2016  . Fatigue 02/17/2016  . B12 DEFICIENCY 02/01/2010  . Vitamin D deficiency 02/01/2010  . ANEMIA, IRON DEFICIENCY 12/30/2009  . OTHER DYSPNEA AND RESPIRATORY ABNORMALITIES 12/30/2009  . Nonspecific (abnormal) findings on radiological and other examination of body  structure 12/30/2009  . COMPUTERIZED TOMOGRAPHY, CHEST, ABNORMAL 12/30/2009    Past Medical History:  Diagnosis Date  . Anemia   . Fibromyositis   . Hypertension   . Osteoarthritis   . PONV (postoperative nausea and vomiting)   . Vitamin D deficiency     Family History  Problem Relation Age of Onset  . Diabetes Mother   . Hypertension Mother   . Hypertension Father   . Hypertension Brother   . Osteoarthritis Brother   . Hypertension Brother   . Osteoarthritis Brother   . Endometriosis Daughter   . Anesthesia problems Neg Hx   . Hypotension Neg Hx   . Pseudochol deficiency Neg Hx   . Malignant hyperthermia Neg Hx    Past Surgical History:  Procedure Laterality Date  . APPENDECTOMY  2010   [facility]  . CARPAL TUNNEL RELEASE Left   . CHOLECYSTECTOMY  1999   lap, [facility]  . GASTRIC BYPASS  2003   [facility]  . HERNIA REPAIR  08/2010, 02/2010    incisional, [facility], [facility]  . INCISIONAL HERNIA REPAIR  10/12/2010   Procedure: HERNIA REPAIR INCISIONAL;  Surgeon: 12/13/2010;  Location: [facility];  Service: General;  Laterality: N/A;  Recurrent Incisional Hernia Repair with Mesh  . KNEE SURGERY Right 01/2018   meniscal tear   . PANNICULECTOMY  2008   [facility]  . SHOULDER SURGERY Left   . TENNIS ELBOW RELEASE/NIRSCHEL PROCEDURE Bilateral    Social History   Social History Narrative  . Not on file    There is no immunization history on file for this patient.   Objective: Vital Signs: There were no vitals taken for this visit.   Physical Exam   Musculoskeletal Exam: ***  CDAI Exam: CDAI Score: -- Patient Global: --; Provider Global: -- Swollen: --; Tender: -- Joint Exam 12/02/2019   No joint exam has been documented for this visit   There is currently no information documented on the homunculus. Go to the Rheumatology activity and complete the homunculus joint exam.  Investigation: No additional findings.  Imaging: No results found.  Recent Labs: Lab Results  Component  Value Date   WBC 6.3 10/27/2019   HGB 8.6 (L) 10/27/2019   PLT 286 10/27/2019   NA 133 (L) 10/27/2019   K 3.6 10/27/2019   CL 99 10/27/2019   CO2 25 10/27/2019   GLUCOSE 89 10/27/2019   BUN 22 10/27/2019   CREATININE 1.31 (H) 10/27/2019   BILITOT 0.5 10/27/2019   ALKPHOS 84 12/05/2009   AST 15 10/27/2019   ALT 13 10/27/2019   PROT 6.4 10/27/2019   PROT 6.4 10/27/2019   ALBUMIN 3.1 (L) 12/05/2009   CALCIUM 8.7 10/27/2019   GFRAA 56 (L) 10/27/2019   QFTBGOLDPLUS NEGATIVE 10/27/2019    Speciality Comments: No specialty comments available.  Procedures:  No procedures performed Allergies: Patient has no known allergies.   Assessment / Plan:     Visit Diagnoses: Chronic inflammatory arthritis  High risk medication use  Fibromyalgia  Other insomnia  Other fatigue  DDD (degenerative disc disease), cervical  Primary osteoarthritis of both hands  Primary osteoarthritis of both knees  Effusion, right knee  Trochanteric bursitis of both hips  Primary osteoarthritis of both feet  Vitamin D deficiency  Carpal tunnel syndrome, right upper limb  Major depressive disorder, recurrent episode, moderate (HCC)  Orders: No orders of the defined types were placed in this encounter.  No orders of the defined types were placed in this encounter.   Face-to-face time spent with patient was *** minutes. Greater than 50% of time was spent in counseling and coordination of care.  Follow-Up Instructions: No follow-ups on file.   Gearldine Bienenstock, PA-C  Note - This record has been created using Dragon software.  Chart creation errors have been sought, but may not always  have been located. Such creation errors do not reflect on  the standard of medical care.

## 2019-11-28 NOTE — Telephone Encounter (Signed)
Last Visit: 10/27/2019 Next Visit: 12/02/2019 Labs: 10/27/2019 Hgb and hct are low and trending down. Creatinine is elevated and trending up. GFR is 48.  Current Dose per office note on 10/27/2019: Arava 20 mg 1 tablet by mouth daily Dx: Chronic inflammatory arthritis  Okay to refill Arava?

## 2019-11-28 NOTE — Telephone Encounter (Signed)
GFR is low.  Please advise patient to discontinue Voltaren and ibuprofen.  GFR could be low also due to diuretic use.

## 2019-12-02 ENCOUNTER — Ambulatory Visit: Payer: Medicare Other | Admitting: Physician Assistant

## 2019-12-03 HISTORY — PX: KNEE SURGERY: SHX244

## 2019-12-09 DIAGNOSIS — Z01812 Encounter for preprocedural laboratory examination: Secondary | ICD-10-CM | POA: Diagnosis not present

## 2019-12-09 DIAGNOSIS — Z01818 Encounter for other preprocedural examination: Secondary | ICD-10-CM | POA: Diagnosis not present

## 2019-12-11 DIAGNOSIS — Z9884 Bariatric surgery status: Secondary | ICD-10-CM | POA: Diagnosis not present

## 2019-12-11 DIAGNOSIS — S83241A Other tear of medial meniscus, current injury, right knee, initial encounter: Secondary | ICD-10-CM | POA: Diagnosis not present

## 2019-12-11 DIAGNOSIS — M23203 Derangement of unspecified medial meniscus due to old tear or injury, right knee: Secondary | ICD-10-CM | POA: Diagnosis not present

## 2019-12-11 DIAGNOSIS — S83271D Complex tear of lateral meniscus, current injury, right knee, subsequent encounter: Secondary | ICD-10-CM | POA: Diagnosis not present

## 2019-12-11 DIAGNOSIS — M5020 Other cervical disc displacement, unspecified cervical region: Secondary | ICD-10-CM | POA: Diagnosis not present

## 2019-12-11 DIAGNOSIS — Z86711 Personal history of pulmonary embolism: Secondary | ICD-10-CM | POA: Diagnosis not present

## 2019-12-11 DIAGNOSIS — G4733 Obstructive sleep apnea (adult) (pediatric): Secondary | ICD-10-CM | POA: Diagnosis not present

## 2019-12-11 DIAGNOSIS — M23200 Derangement of unspecified lateral meniscus due to old tear or injury, right knee: Secondary | ICD-10-CM | POA: Diagnosis not present

## 2019-12-11 DIAGNOSIS — S83271A Complex tear of lateral meniscus, current injury, right knee, initial encounter: Secondary | ICD-10-CM | POA: Diagnosis not present

## 2019-12-11 DIAGNOSIS — I10 Essential (primary) hypertension: Secondary | ICD-10-CM | POA: Diagnosis not present

## 2019-12-11 DIAGNOSIS — M1711 Unilateral primary osteoarthritis, right knee: Secondary | ICD-10-CM | POA: Diagnosis not present

## 2019-12-11 DIAGNOSIS — S83281A Other tear of lateral meniscus, current injury, right knee, initial encounter: Secondary | ICD-10-CM | POA: Diagnosis not present

## 2019-12-22 DIAGNOSIS — M25461 Effusion, right knee: Secondary | ICD-10-CM | POA: Diagnosis not present

## 2019-12-23 DIAGNOSIS — M25461 Effusion, right knee: Secondary | ICD-10-CM | POA: Diagnosis not present

## 2020-02-03 DIAGNOSIS — M25569 Pain in unspecified knee: Secondary | ICD-10-CM | POA: Diagnosis not present

## 2020-02-03 DIAGNOSIS — M545 Low back pain, unspecified: Secondary | ICD-10-CM | POA: Diagnosis not present

## 2020-02-03 DIAGNOSIS — Z79891 Long term (current) use of opiate analgesic: Secondary | ICD-10-CM | POA: Diagnosis not present

## 2020-02-03 DIAGNOSIS — M25519 Pain in unspecified shoulder: Secondary | ICD-10-CM | POA: Diagnosis not present

## 2020-02-03 DIAGNOSIS — M542 Cervicalgia: Secondary | ICD-10-CM | POA: Diagnosis not present

## 2020-02-10 DIAGNOSIS — S83271D Complex tear of lateral meniscus, current injury, right knee, subsequent encounter: Secondary | ICD-10-CM | POA: Diagnosis not present

## 2020-03-11 DIAGNOSIS — M25461 Effusion, right knee: Secondary | ICD-10-CM | POA: Diagnosis not present

## 2020-04-29 ENCOUNTER — Ambulatory Visit: Payer: Medicare Other | Admitting: Rheumatology

## 2020-04-30 ENCOUNTER — Ambulatory Visit: Payer: Medicare Other | Admitting: Rheumatology

## 2020-05-03 ENCOUNTER — Other Ambulatory Visit: Payer: Self-pay

## 2020-05-03 ENCOUNTER — Ambulatory Visit: Payer: Medicare Other | Admitting: Rheumatology

## 2020-05-03 ENCOUNTER — Encounter: Payer: Self-pay | Admitting: Rheumatology

## 2020-05-03 VITALS — BP 80/48 | HR 96 | Ht 64.0 in | Wt 269.0 lb

## 2020-05-03 DIAGNOSIS — M5030 Other cervical disc degeneration, unspecified cervical region: Secondary | ICD-10-CM

## 2020-05-03 DIAGNOSIS — M7061 Trochanteric bursitis, right hip: Secondary | ICD-10-CM

## 2020-05-03 DIAGNOSIS — Z79899 Other long term (current) drug therapy: Secondary | ICD-10-CM

## 2020-05-03 DIAGNOSIS — E559 Vitamin D deficiency, unspecified: Secondary | ICD-10-CM

## 2020-05-03 DIAGNOSIS — M797 Fibromyalgia: Secondary | ICD-10-CM

## 2020-05-03 DIAGNOSIS — G5601 Carpal tunnel syndrome, right upper limb: Secondary | ICD-10-CM

## 2020-05-03 DIAGNOSIS — M0600 Rheumatoid arthritis without rheumatoid factor, unspecified site: Secondary | ICD-10-CM | POA: Diagnosis not present

## 2020-05-03 DIAGNOSIS — M19042 Primary osteoarthritis, left hand: Secondary | ICD-10-CM

## 2020-05-03 DIAGNOSIS — R5383 Other fatigue: Secondary | ICD-10-CM

## 2020-05-03 DIAGNOSIS — F331 Major depressive disorder, recurrent, moderate: Secondary | ICD-10-CM

## 2020-05-03 DIAGNOSIS — Z7189 Other specified counseling: Secondary | ICD-10-CM

## 2020-05-03 DIAGNOSIS — Z806 Family history of leukemia: Secondary | ICD-10-CM

## 2020-05-03 DIAGNOSIS — M19071 Primary osteoarthritis, right ankle and foot: Secondary | ICD-10-CM

## 2020-05-03 DIAGNOSIS — M7062 Trochanteric bursitis, left hip: Secondary | ICD-10-CM

## 2020-05-03 DIAGNOSIS — Z9119 Patient's noncompliance with other medical treatment and regimen: Secondary | ICD-10-CM

## 2020-05-03 DIAGNOSIS — M19041 Primary osteoarthritis, right hand: Secondary | ICD-10-CM | POA: Diagnosis not present

## 2020-05-03 DIAGNOSIS — G4709 Other insomnia: Secondary | ICD-10-CM

## 2020-05-03 DIAGNOSIS — Z91199 Patient's noncompliance with other medical treatment and regimen due to unspecified reason: Secondary | ICD-10-CM

## 2020-05-03 DIAGNOSIS — M19072 Primary osteoarthritis, left ankle and foot: Secondary | ICD-10-CM

## 2020-05-03 DIAGNOSIS — M170 Bilateral primary osteoarthritis of knee: Secondary | ICD-10-CM

## 2020-05-03 MED ORDER — PREDNISONE 5 MG PO TABS: ORAL_TABLET | ORAL | 0 refills | Status: DC

## 2020-05-03 NOTE — Progress Notes (Signed)
Office Visit Note  Patient: Amanda Hammond             Date of Birth: Nov 25, 1970           MRN: [id number]             PCP: Suzan Slick, MD Referring: Suzan Slick, MD Visit Date: 05/03/2020 Occupation: @GUAROCC @  Subjective:  Pain in multiple joints.   History of Present Illness: Amanda Hammond is a 50 y.o. female with the history of seronegative inflammatory arthritis.  She was seen last in the office on October 27, 2019.  At the time she was on Plaquenil which she is stopped.  When she presented she had severe inflammatory arthritis involving her bilateral wrists, her hands and her right knee joint.  She was a started on leflunomide.  He states she took the medication for 1 month she is uncertain about the response to the medication.  She did not get her labs a month later and lost for follow-up.  Has been off all the medications during this duration.  She states she has been having pain and discomfort in multiple joints which include her hands, her knee joints and her feet.  She has been noticing swelling in her hands.  She has been having a lot of discomfort in her right knee joint.  She has been followed by orthopedic surgeon.  She states she had arthroscopic surgery on her right knee joint.  She is still having discomfort in her right knee and has been limping.  She states she had x-rays by her orthopedic surgeon and she was advised total knee replacement which she will consider in the near future.  Activities of Daily Living:  Patient reports morning stiffness for 1-3 hours.   Patient Reports nocturnal pain.  Difficulty dressing/grooming: Reports Difficulty climbing stairs: Reports Difficulty getting out of chair: Reports Difficulty using hands for taps, buttons, cutlery, and/or writing: Reports  Review of Systems  Constitutional: Positive for fatigue. Negative for night sweats, weight gain and weight loss.  HENT: Positive for mouth dryness and nose dryness. Negative  for mouth sores, trouble swallowing and trouble swallowing.   Eyes: Positive for dryness. Negative for pain, redness, itching and visual disturbance.  Respiratory: Negative for cough, hemoptysis, shortness of breath and difficulty breathing.   Cardiovascular: Positive for swelling in legs/feet. Negative for chest pain, palpitations, hypertension and irregular heartbeat.  Gastrointestinal: Positive for constipation. Negative for abdominal pain, blood in stool and diarrhea.  Endocrine: Negative for increased urination.  Genitourinary: Negative for painful urination and vaginal dryness.  Musculoskeletal: Positive for arthralgias, joint pain, joint swelling, myalgias, morning stiffness, muscle tenderness and myalgias. Negative for muscle weakness.  Skin: Negative for color change, rash, hair loss, redness, skin tightness, ulcers and sensitivity to sunlight.  Allergic/Immunologic: Negative for susceptible to infections.  Neurological: Positive for numbness and headaches. Negative for dizziness, memory loss, night sweats and weakness.  Hematological: Negative for swollen glands.  Psychiatric/Behavioral: Positive for depressed mood and sleep disturbance. Negative for confusion. The patient is nervous/anxious.     PMFS History:  Patient Active Problem List   Diagnosis Date Noted  . Rheumatoid factor positive 11/06/2016  . Trochanteric bursitis of both hips 11/06/2016  . History of fibromyalgia 11/06/2016  . DDD (degenerative disc disease), cervical 11/06/2016  . Carpal tunnel syndrome, left upper limb 11/06/2016  . Carpal tunnel syndrome, right upper limb 11/06/2016  . Trigger finger, left middle finger 11/06/2016  . Major depressive disorder,  recurrent episode, moderate (HCC) 04/27/2016  . Pain in left hand 04/25/2016  . Pain in right hand 04/25/2016  . Primary osteoarthritis of both hands 04/25/2016  . Primary osteoarthritis of both knees 04/25/2016  . Primary osteoarthritis of both feet  04/25/2016  . Fibromyalgia 02/17/2016  . Other insomnia 02/17/2016  . Fatigue 02/17/2016  . B12 DEFICIENCY 02/01/2010  . Vitamin D deficiency 02/01/2010  . ANEMIA, IRON DEFICIENCY 12/30/2009  . OTHER DYSPNEA AND RESPIRATORY ABNORMALITIES 12/30/2009  . Nonspecific (abnormal) findings on radiological and other examination of body structure 12/30/2009  . COMPUTERIZED TOMOGRAPHY, CHEST, ABNORMAL 12/30/2009    Past Medical History:  Diagnosis Date  . Anemia   . Fibromyositis   . Hypertension   . Osteoarthritis   . PONV (postoperative nausea and vomiting)   . Vitamin D deficiency     Family History  Problem Relation Age of Onset  . Diabetes Mother   . Hypertension Mother   . Hypertension Father   . Hypertension Brother   . Osteoarthritis Brother   . Hypertension Brother   . Osteoarthritis Brother   . Endometriosis Daughter   . Anesthesia problems Neg Hx   . Hypotension Neg Hx   . Pseudochol deficiency Neg Hx   . Malignant hyperthermia Neg Hx    Past Surgical History:  Procedure Laterality Date  . APPENDECTOMY  2010   [facility]  . CARPAL TUNNEL RELEASE Left   . CHOLECYSTECTOMY  1999   lap, [facility]  . GASTRIC BYPASS  2003   [facility]  . HERNIA REPAIR  08/2010, 02/2010    incisional, [facility], [facility]  . INCISIONAL HERNIA REPAIR  10/12/2010   Procedure: HERNIA REPAIR INCISIONAL;  Surgeon: Dalia Heading;  Location: [facility];  Service: General;  Laterality: N/A;  Recurrent Incisional Hernia Repair with Mesh  . KNEE SURGERY Right 01/2018   meniscal tear   . KNEE SURGERY Right 12/2019  . PANNICULECTOMY  2008   [facility]  . SHOULDER SURGERY Left   . TENNIS ELBOW RELEASE/NIRSCHEL PROCEDURE Bilateral    Social History   Social History Narrative  . Not on file    There is no immunization history on file for this patient.   Objective: Vital Signs: BP (!) 80/48 (BP Location: Left Arm, Patient Position: Sitting, Cuff Size: Normal)   Pulse 96   Ht  (1.626 m)   Wt 269 lb (122 kg)   BMI 46.17  kg/m    Physical Exam Vitals and nursing note reviewed.  Constitutional:      Appearance: She is well-developed and well-nourished.  HENT:     Head: Normocephalic and atraumatic.  Eyes:     Extraocular Movements: EOM normal.     Conjunctiva/sclera: Conjunctivae normal.  Cardiovascular:     Rate and Rhythm: Normal rate and regular rhythm.     Pulses: Intact distal pulses.     Heart sounds: Normal heart sounds.  Pulmonary:     Effort: Pulmonary effort is normal.     Breath sounds: Normal breath sounds.  Abdominal:     General: Bowel sounds are normal.     Palpations: Abdomen is soft.  Musculoskeletal:     Cervical back: Normal range of motion.  Lymphadenopathy:     Cervical: No cervical adenopathy.  Skin:    General: Skin is warm and dry.     Capillary Refill: Capillary refill takes less than 2 seconds.  Neurological:     Mental Status: She is alert and oriented to person, place,  and time.  Psychiatric:        Mood and Affect: Mood and affect normal.        Behavior: Behavior normal.      Musculoskeletal Exam: She has good range of motion of her cervical spine.  She has painful limited range of motion of her bilateral shoulder joints.  Elbow joints and wrist joints with good range of motion.  She tenderness over bilateral wrist joints, MCPs and PIPs as described below.  She had good range of motion of her hip joints.  She has warmth and decreased range of motion of her right knee joint.  Left knee joint was in good range of motion.  She has warmth on palpation of her right ankle joint.  She has tenderness across MTPs.  CDAI Exam: CDAI Score: 16.6  Patient Global: 8 mm; Provider Global: 8 mm Swollen: 5 ; Tender: 22  Joint Exam 05/03/2020      Right  Left  Glenohumeral   Tender   Tender  Wrist   Tender   Tender  MCP 2  Swollen Tender  Swollen Tender  MCP 3  Swollen Tender   Tender  MCP 4   Tender   Tender  Knee  Swollen Tender     Ankle  Swollen Tender     MTP 1    Tender   Tender  MTP 2   Tender   Tender  MTP 3   Tender   Tender  MTP 4   Tender   Tender  MTP 5   Tender   Tender     Investigation: No additional findings.  Imaging: No results found.  Recent Labs: Lab Results  Component Value Date   WBC 6.3 10/27/2019   HGB 8.6 (L) 10/27/2019   PLT 286 10/27/2019   NA 133 (L) 10/27/2019   K 3.6 10/27/2019   CL 99 10/27/2019   CO2 25 10/27/2019   GLUCOSE 89 10/27/2019   BUN 22 10/27/2019   CREATININE 1.31 (H) 10/27/2019   BILITOT 0.5 10/27/2019   ALKPHOS 84 12/05/2009   AST 15 10/27/2019   ALT 13 10/27/2019   PROT 6.4 10/27/2019   PROT 6.4 10/27/2019   ALBUMIN 3.1 (L) 12/05/2009   CALCIUM 8.7 10/27/2019   GFRAA 56 (L) 10/27/2019   QFTBGOLDPLUS NEGATIVE 10/27/2019    Speciality Comments: No specialty comments available.  Procedures:  No procedures performed Allergies: Patient has no known allergies.   Assessment / Plan:     Visit Diagnoses: Seronegative rheumatoid arthritis (HCC) - Erosive disease.  Patient has severe erosive rheumatoid arthritis involving multiple joints.  Noncompliance and hesitancy to take medication has been a major issue in her treatment.  She was initially on Plaquenil which she stopped.  She was placed on leflunomide which she took for a month and then stopped.  She did not come for a follow-up visit since July 2021.  She continues to have pain and swelling in multiple joints.  She has synovitis in her joints as described above.  I detailed discussion regarding different treatment options and their side effects.  Use of Biologics was also discussed.  I think she will be a good candidate for an office Cimzia for the compliance reasons.  But she declined the medication.  She wants to go back on leflunomide.  I will give her prednisone as a bridging therapy starting at 20 mg and taper by 5 mg every week.  She will also initiate leflunomide 10 mg for 2  weeks once her labs are available and normal then we can  increase it to 20mg  p.o. daily.  We will check labs in 2 weeks and then every 2 months to monitor for drug toxicity.  We will call in leflunomide after we get the results of her initial labs obtained today.  High risk medication use - Plan: CBC with Differential/Platelet, COMPLETE METABOLIC PANEL WITH GFR, Sedimentation rate today and then every 2 weeks x 2 and then every 3 months.  Primary osteoarthritis of both hands-she has osteoarthritis and rheumatoid arthritis overlap.  She continues to have discomfort in her hands.  Carpal tunnel syndrome, right upper limb-she still has carpal tunnel syndrome symptoms.  I believe some of the symptoms will improve as the inflammation goes down.  Trochanteric bursitis of both hips-she continues to have some tenderness over trochanteric bursa but is not very painful.  Primary osteoarthritis of both knees-she has pain and discomfort in her bilateral knee joints.  She has been followed by orthopedic surgeon.  She states she will need right total knee replacement.  I do not have x-rays available.  She had arthroscopic surgery on her right knee joint last year.  She had warmth and swelling in her right knee today.  Primary osteoarthritis of both feet-she had swelling over her right ankle joint.  She had tenderness across the MTPs.  DDD (degenerative disc disease), cervical-she has some limitation with range of motion and discomfort.  Fibromyalgia-she continues to have generalized pain and discomfort from fibromyalgia.  Other insomnia-she has chronic insomnia.  Good sleep hygiene was discussed.  Other fatigue-elated to fibromyalgia and insomnia.  Vitamin D deficiency  Major depressive disorder, recurrent episode, moderate (HCC)  Noncompliance  Educated about COVID-19 virus infection-I had detailed discussion regarding immunization against COVID-19 infection.  Benefits were discussed.  Instructions were placed in the handout.  Family history of leukemia -  Brother, according to the patient.  Orders: Orders Placed This Encounter  Procedures  . CBC with Differential/Platelet  . COMPLETE METABOLIC PANEL WITH GFR  . Sedimentation rate   Meds ordered this encounter  Medications  . predniSONE (DELTASONE) 5 MG tablet    Sig: Take 4 tabs po qd x 7 days, 3  tabs po qd x 7 days, 2  tabs po qd x 7 days, 1  tab po qd x 7 days    Dispense:  70 tablet    Refill:  0     Follow-Up Instructions: Return in about 6 weeks (around 06/14/2020) for Rheumatoid arthritis.   06/16/2020, MD  Note - This record has been created using Pollyann Savoy.  Chart creation errors have been sought, but may not always  have been located. Such creation errors do not reflect on  the standard of medical care.

## 2020-05-03 NOTE — Patient Instructions (Addendum)
Standing Labs We placed an order today for your standing lab work.   Please have your standing labs drawn in 2 weeks x 2 and then every 3 months  If possible, please have your labs drawn 2 weeks prior to your appointment so that the provider can discuss your results at your appointment.  We have open lab daily Monday through Thursday from 8:30-12:30 PM and 1:30-4:30 PM and Friday from 8:30-12:30 PM and 1:30-4:00 PM at the office of Dr. Pollyann Savoy, [facility].   Please be advised, all patients with office appointments requiring lab work will take precedents over walk-in lab work.  If possible, please come for your lab work on Monday and Friday afternoons, as you may experience shorter wait times. The office is located at [address], [place], [place] [address] No appointment is necessary.   Labs are drawn by [facility]. Please bring your co-pay at the time of your lab draw.  You may receive a bill from [facility] for your lab work.  If you wish to have your labs drawn at another location, please call the office 24 hours in advance to send orders.  If you have any questions regarding directions or hours of operation,  please call [phone].   As a reminder, please drink plenty of water prior to coming for your lab work. Thanks!   Leflunomide tablets What is this medicine? LEFLUNOMIDE (le FLOO na mide) is for rheumatoid arthritis. This medicine may be used for other purposes; ask your health care provider or pharmacist if you have questions. COMMON BRAND NAME(S): Arava What should I tell my health care provider before I take this medicine? They need to know if you have any of these conditions:  diabetes  have a fever or infection  high blood pressure  immune system problems  kidney disease  liver disease  low blood cell counts, like low white cell, platelet, or red cell counts  lung or breathing disease, like asthma  recently received or  scheduled to receive a vaccine  receiving treatment for cancer  skin conditions or sensitivity  tingling of the fingers or toes, or other nerve disorder  tuberculosis  an unusual or allergic reaction to leflunomide, teriflunomide, other medicines, food, dyes, or preservatives  pregnant or trying to get pregnant  breast-feeding How should I use this medicine? Take this medicine by mouth with a full glass of water. Follow the directions on the prescription label. Take your medicine at regular intervals. Do not take your medicine more often than directed. Do not stop taking except on your doctor's advice. Talk to your pediatrician regarding the use of this medicine in children. Special care may be needed. Overdosage: If you think you have taken too much of this medicine contact a poison control center or emergency room at once. NOTE: This medicine is only for you. Do not share this medicine with others. What if I miss a dose? If you miss a dose, take it as soon as you can. If it is almost time for your next dose, take only that dose. Do not take double or extra doses. What may interact with this medicine? Do not take this medicine with any of the following medications:  teriflunomide This medicine may also interact with the following medications:  alosetron  birth control pills  caffeine  cefaclor  certain medicines for diabetes like nateglinide, repaglinide, rosiglitazone, pioglitazone  certain medicines for high cholesterol like atorvastatin, pravastatin, rosuvastatin, simvastatin  charcoal  cholestyramine  ciprofloxacin  duloxetine  furosemide  ketoprofen  live virus vaccines  medicines that increase your risk for infection  methotrexate  mitoxantrone  paclitaxel  penicillin  theophylline  tizanidine  warfarin This list may not describe all possible interactions. Give your health care provider a list of all the medicines, herbs, non-prescription  drugs, or dietary supplements you use. Also tell them if you smoke, drink alcohol, or use illegal drugs. Some items may interact with your medicine. What should I watch for while using this medicine? Visit your health care provider for regular checks on your progress. Tell your doctor or health care provider if your symptoms do not start to get better or if they get worse. You may need blood work done while you are taking this medicine. This medicine may cause serious skin reactions. They can happen weeks to months after starting the medicine. Contact your health care provider right away if you notice fevers or flu-like symptoms with a rash. The rash may be red or purple and then turn into blisters or peeling of the skin. Or, you might notice a red rash with swelling of the face, lips or lymph nodes in your neck or under your arms. This medicine may stay in your body for up to 2 years after your last dose. Tell your doctor about any unusual side effects or symptoms. A medicine can be given to help lower your blood levels of this medicine more quickly. Women must use effective birth control with this medicine. There is a potential for serious side effects to an unborn child. Do not become pregnant while taking this medicine. Inform your doctor if you wish to become pregnant. This medicine remains in your blood after you stop taking it. You must continue using effective birth control until the blood levels have been checked and they are low enough. A medicine can be given to help lower your blood levels of this medicine more quickly. Immediately talk to your doctor if you think you may be pregnant. You may need a pregnancy test. Talk to your health care provider or pharmacist for more information. You should not receive certain vaccines during your treatment and for a certain time after your treatment with this medication ends. Talk to your health care provider for more information. What side effects may I  notice from receiving this medicine? Side effects that you should report to your doctor or health care professional as soon as possible:  allergic reactions like skin rash, itching or hives, swelling of the face, lips, or tongue  breathing problems  cough  increased blood pressure  low blood counts - this medicine may decrease the number of white blood cells and platelets. You may be at increased risk for infections and bleeding.  pain, tingling, numbness in the hands or feet  rash, fever, and swollen lymph nodes  redness, blistering, peeing or loosening of the skin, including inside the mouth  signs of decreased platelets or bleeding - bruising, pinpoint red spots on the skin, black, tarry stools, blood in urine  signs of infection - fever or chills, cough, sore throat, pain or trouble passing urine  signs and symptoms of liver injury like dark yellow or brown urine; general ill feeling or flu-like symptoms; light-colored stools; loss of appetite; nausea; right upper belly pain; unusually weak or tired; yellowing of the eyes or skin  trouble passing urine or change in the amount of urine  vomiting Side effects that usually do not require medical attention (report  to your doctor or health care professional if they continue or are bothersome):  diarrhea  hair thinning or loss  headache  nausea  tiredness This list may not describe all possible side effects. Call your doctor for medical advice about side effects. You may report side effects to FDA at 1-800-FDA-1088. Where should I keep my medicine? Keep out of the reach of children. Store at room temperature between 15 and 30 degrees C (59 and 86 degrees F). Protect from moisture and light. Throw away any unused medicine after the expiration date. NOTE: This sheet is a summary. It may not cover all possible information. If you have questions about this medicine, talk to your doctor, pharmacist, or health care provider.   2021 Elsevier/Gold Standard (2018-06-21 15:06:48)  COVID-19 vaccine recommendations:   COVID-19 vaccine is recommended for everyone (unless you are allergic to a vaccine component), even if you are on a medication that suppresses your immune system.   If you are on Methotrexate, Cellcept (mycophenolate), Rinvoq, Harriette Ohara, and Olumiant- hold the medication for 1 week after each vaccine. Hold Methotrexate for 2 weeks after the single dose COVID-19 vaccine.   If you are on Orencia subcutaneous injection - hold medication one week prior to and one week after the first COVID-19 vaccine dose (only).   If you are on Orencia IV infusions- time vaccination administration so that the first COVID-19 vaccination will occur four weeks after the infusion and postpone the subsequent infusion by one week.   If you are on Cyclophosphamide or Rituxan infusions please contact your doctor prior to receiving the COVID-19 vaccine.   Do not take Tylenol or any anti-inflammatory medications (NSAIDs) 24 hours prior to the COVID-19 vaccination.   For immunocompromised individuals the recommendations are to get COVID-19 vaccine, first 3 doses 1 month apart and then a booster 5 months later.  There is no direct evidence about the efficacy of the COVID-19 vaccine in individuals who are on medications that suppress the immune system.   Even if you are fully vaccinated, and you are on any medications that suppress your immune system, please continue to wear a mask, maintain at least six feet social distance and practice hand hygiene.   If you develop a COVID-19 infection, please contact your PCP or our office to determine if you need monoclonal antibody infusion.  The booster vaccine is now available for immunocompromised patients.   Please see the following web sites for updated information.   [email or web address]

## 2020-05-03 NOTE — Telephone Encounter (Signed)
Pending lab results, patient will be starting arava, per Dr. Corliss Skains. Thanks!   Consent is already under the media tab.

## 2020-05-04 LAB — CBC WITH DIFFERENTIAL/PLATELET
Absolute Monocytes: 599 cells/uL (ref 200–950)
Basophils Absolute: 49 cells/uL (ref 0–200)
Basophils Relative: 0.6 %
Eosinophils Absolute: 138 cells/uL (ref 15–500)
Eosinophils Relative: 1.7 %
HCT: 31.1 % — ABNORMAL LOW (ref 35.0–45.0)
Hemoglobin: 9.5 g/dL — ABNORMAL LOW (ref 11.7–15.5)
Lymphs Abs: 3434 cells/uL (ref 850–3900)
MCH: 22 pg — ABNORMAL LOW (ref 27.0–33.0)
MCHC: 30.5 g/dL — ABNORMAL LOW (ref 32.0–36.0)
MCV: 72.2 fL — ABNORMAL LOW (ref 80.0–100.0)
MPV: 10.3 fL (ref 7.5–12.5)
Monocytes Relative: 7.4 %
Neutro Abs: 3880 cells/uL (ref 1500–7800)
Neutrophils Relative %: 47.9 %
Platelets: 351 10*3/uL (ref 140–400)
RBC: 4.31 10*6/uL (ref 3.80–5.10)
RDW: 15.5 % — ABNORMAL HIGH (ref 11.0–15.0)
Total Lymphocyte: 42.4 %
WBC: 8.1 10*3/uL (ref 3.8–10.8)

## 2020-05-04 LAB — COMPLETE METABOLIC PANEL WITH GFR
AG Ratio: 1.6 (calc) (ref 1.0–2.5)
ALT: 11 U/L (ref 6–29)
AST: 12 U/L (ref 10–35)
Albumin: 4.1 g/dL (ref 3.6–5.1)
Alkaline phosphatase (APISO): 110 U/L (ref 31–125)
BUN/Creatinine Ratio: 16 (calc) (ref 6–22)
BUN: 20 mg/dL (ref 7–25)
CO2: 29 mmol/L (ref 20–32)
Calcium: 9 mg/dL (ref 8.6–10.2)
Chloride: 100 mmol/L (ref 98–110)
Creat: 1.27 mg/dL — ABNORMAL HIGH (ref 0.50–1.10)
GFR, Est African American: 57 mL/min/{1.73_m2} — ABNORMAL LOW (ref >=60)
GFR, Est Non African American: 50 mL/min/{1.73_m2} — ABNORMAL LOW (ref >=60)
Globulin: 2.6 g/dL (calc) (ref 1.9–3.7)
Glucose, Bld: 92 mg/dL (ref 65–99)
Potassium: 3.7 mmol/L (ref 3.5–5.3)
Sodium: 136 mmol/L (ref 135–146)
Total Bilirubin: 0.3 mg/dL (ref 0.2–1.2)
Total Protein: 6.7 g/dL (ref 6.1–8.1)

## 2020-05-04 LAB — SEDIMENTATION RATE: Sed Rate: 14 mm/h (ref 0–20)

## 2020-05-04 MED ORDER — LEFLUNOMIDE 10 MG PO TABS: ORAL_TABLET | ORAL | 0 refills | Status: DC

## 2020-05-04 NOTE — Progress Notes (Signed)
Anemia persists.  Creatinine is elevated but better than the previous value.  Sed rate is normal.  Patient should see her PCP or hematologist for the evaluation of anemia.

## 2020-05-04 NOTE — Telephone Encounter (Signed)
Anemia persists. Creatinine is elevated but better than the previous value. Sed rate is normal. Patient should see her PCP or hematologist for the evaluation of anemia.   initiate leflunomide 10 mg for 2 weeks once her labs are available and normal then we can increase it to 20mg  p.o. daily.

## 2020-06-01 NOTE — Progress Notes (Deleted)
Office Visit Note  Patient: Amanda Hammond             Date of Birth: May 30, 1970           MRN: [id number]             PCP: Suzan Slick, MD Referring: Suzan Slick, MD Visit Date: 06/15/2020 Occupation: @GUAROCC @  Subjective:  No chief complaint on file.   History of Present Illness: Amanda Hammond is a 50 y.o. female ***   Activities of Daily Living:  Patient reports morning stiffness for *** {minute/hour:19697}.   Patient {ACTIONS;DENIES/REPORTS:21021675::"Denies"} nocturnal pain.  Difficulty dressing/grooming: {ACTIONS;DENIES/REPORTS:21021675::"Denies"} Difficulty climbing stairs: {ACTIONS;DENIES/REPORTS:21021675::"Denies"} Difficulty getting out of chair: {ACTIONS;DENIES/REPORTS:21021675::"Denies"} Difficulty using hands for taps, buttons, cutlery, and/or writing: {ACTIONS;DENIES/REPORTS:21021675::"Denies"}  No Rheumatology ROS completed.   PMFS History:  Patient Active Problem List   Diagnosis Date Noted  . Rheumatoid factor positive 11/06/2016  . Trochanteric bursitis of both hips 11/06/2016  . History of fibromyalgia 11/06/2016  . DDD (degenerative disc disease), cervical 11/06/2016  . Carpal tunnel syndrome, left upper limb 11/06/2016  . Carpal tunnel syndrome, right upper limb 11/06/2016  . Trigger finger, left middle finger 11/06/2016  . Major depressive disorder, recurrent episode, moderate (HCC) 04/27/2016  . Pain in left hand 04/25/2016  . Pain in right hand 04/25/2016  . Primary osteoarthritis of both hands 04/25/2016  . Primary osteoarthritis of both knees 04/25/2016  . Primary osteoarthritis of both feet 04/25/2016  . Fibromyalgia 02/17/2016  . Other insomnia 02/17/2016  . Fatigue 02/17/2016  . B12 DEFICIENCY 02/01/2010  . Vitamin D deficiency 02/01/2010  . ANEMIA, IRON DEFICIENCY 12/30/2009  . OTHER DYSPNEA AND RESPIRATORY ABNORMALITIES 12/30/2009  . Nonspecific (abnormal) findings on radiological and other examination of body  structure 12/30/2009  . COMPUTERIZED TOMOGRAPHY, CHEST, ABNORMAL 12/30/2009    Past Medical History:  Diagnosis Date  . Anemia   . Fibromyositis   . Hypertension   . Osteoarthritis   . PONV (postoperative nausea and vomiting)   . Vitamin D deficiency     Family History  Problem Relation Age of Onset  . Diabetes Mother   . Hypertension Mother   . Hypertension Father   . Hypertension Brother   . Osteoarthritis Brother   . Hypertension Brother   . Osteoarthritis Brother   . Endometriosis Daughter   . Anesthesia problems Neg Hx   . Hypotension Neg Hx   . Pseudochol deficiency Neg Hx   . Malignant hyperthermia Neg Hx    Past Surgical History:  Procedure Laterality Date  . APPENDECTOMY  2010   [facility]  . CARPAL TUNNEL RELEASE Left   . CHOLECYSTECTOMY  1999   lap, [facility]  . GASTRIC BYPASS  2003   [facility]  . HERNIA REPAIR  08/2010, 02/2010    incisional, [facility], [facility]  . INCISIONAL HERNIA REPAIR  10/12/2010   Procedure: HERNIA REPAIR INCISIONAL;  Surgeon: 12/13/2010;  Location: [facility];  Service: General;  Laterality: N/A;  Recurrent Incisional Hernia Repair with Mesh  . KNEE SURGERY Right 01/2018   meniscal tear   . KNEE SURGERY Right 12/2019  . PANNICULECTOMY  2008   [facility]  . SHOULDER SURGERY Left   . TENNIS ELBOW RELEASE/NIRSCHEL PROCEDURE Bilateral    Social History   Social History Narrative  . Not on file    There is no immunization history on file for this patient.   Objective: Vital Signs: There were no vitals taken for this visit.  Physical Exam   Musculoskeletal Exam: ***  CDAI Exam: CDAI Score: -- Patient Global: --; Provider Global: -- Swollen: --; Tender: -- Joint Exam 06/15/2020   No joint exam has been documented for this visit   There is currently no information documented on the homunculus. Go to the Rheumatology activity and complete the homunculus joint exam.  Investigation: No additional findings.  Imaging: No results found.  Recent  Labs: Lab Results  Component Value Date   WBC 8.1 05/03/2020   HGB 9.5 (L) 05/03/2020   PLT 351 05/03/2020   NA 136 05/03/2020   K 3.7 05/03/2020   CL 100 05/03/2020   CO2 29 05/03/2020   GLUCOSE 92 05/03/2020   BUN 20 05/03/2020   CREATININE 1.27 (H) 05/03/2020   BILITOT 0.3 05/03/2020   ALKPHOS 84 12/05/2009   AST 12 05/03/2020   ALT 11 05/03/2020   PROT 6.7 05/03/2020   ALBUMIN 3.1 (L) 12/05/2009   CALCIUM 9.0 05/03/2020   GFRAA 57 (L) 05/03/2020   QFTBGOLDPLUS NEGATIVE 10/27/2019    Speciality Comments: No specialty comments available.  Procedures:  No procedures performed Allergies: Patient has no known allergies.   Assessment / Plan:     Visit Diagnoses: Seronegative rheumatoid arthritis (HCC)  High risk medication use  Primary osteoarthritis of both hands  Carpal tunnel syndrome, right upper limb  Trochanteric bursitis of both hips  Primary osteoarthritis of both knees  Primary osteoarthritis of both feet  DDD (degenerative disc disease), cervical  Fibromyalgia  Other insomnia  Other fatigue  Vitamin D deficiency  Major depressive disorder, recurrent episode, moderate (HCC)  Family history of leukemia  Orders: No orders of the defined types were placed in this encounter.  No orders of the defined types were placed in this encounter.   Face-to-face time spent with patient was *** minutes. Greater than 50% of time was spent in counseling and coordination of care.  Follow-Up Instructions: No follow-ups on file.   Gearldine Bienenstock, PA-C  Note - This record has been created using Dragon software.  Chart creation errors have been sought, but may not always  have been located. Such creation errors do not reflect on  the standard of medical care.

## 2020-06-15 ENCOUNTER — Ambulatory Visit: Payer: Medicare Other | Admitting: Physician Assistant

## 2020-06-15 DIAGNOSIS — R5383 Other fatigue: Secondary | ICD-10-CM

## 2020-06-15 DIAGNOSIS — G4709 Other insomnia: Secondary | ICD-10-CM

## 2020-06-15 DIAGNOSIS — M19041 Primary osteoarthritis, right hand: Secondary | ICD-10-CM

## 2020-06-15 DIAGNOSIS — E559 Vitamin D deficiency, unspecified: Secondary | ICD-10-CM

## 2020-06-15 DIAGNOSIS — M5030 Other cervical disc degeneration, unspecified cervical region: Secondary | ICD-10-CM

## 2020-06-15 DIAGNOSIS — M797 Fibromyalgia: Secondary | ICD-10-CM

## 2020-06-15 DIAGNOSIS — M170 Bilateral primary osteoarthritis of knee: Secondary | ICD-10-CM

## 2020-06-15 DIAGNOSIS — Z806 Family history of leukemia: Secondary | ICD-10-CM

## 2020-06-15 DIAGNOSIS — G5601 Carpal tunnel syndrome, right upper limb: Secondary | ICD-10-CM

## 2020-06-15 DIAGNOSIS — M7061 Trochanteric bursitis, right hip: Secondary | ICD-10-CM

## 2020-06-15 DIAGNOSIS — M19071 Primary osteoarthritis, right ankle and foot: Secondary | ICD-10-CM

## 2020-06-15 DIAGNOSIS — F331 Major depressive disorder, recurrent, moderate: Secondary | ICD-10-CM

## 2020-06-15 DIAGNOSIS — M0600 Rheumatoid arthritis without rheumatoid factor, unspecified site: Secondary | ICD-10-CM

## 2020-06-15 DIAGNOSIS — Z79899 Other long term (current) drug therapy: Secondary | ICD-10-CM

## 2020-06-15 NOTE — Progress Notes (Deleted)
Office Visit Note  Patient: Amanda Hammond             Date of Birth: 1971/03/12           MRN: [id number]             PCP: Suzan Slick, MD Referring: Suzan Slick, MD Visit Date: 06/29/2020 Occupation: @GUAROCC @  Subjective:  No chief complaint on file.   History of Present Illness: Amanda Hammond is a 50 y.o. female ***   Activities of Daily Living:  Patient reports morning stiffness for *** {minute/hour:19697}.   Patient {ACTIONS;DENIES/REPORTS:21021675::"Denies"} nocturnal pain.  Difficulty dressing/grooming: {ACTIONS;DENIES/REPORTS:21021675::"Denies"} Difficulty climbing stairs: {ACTIONS;DENIES/REPORTS:21021675::"Denies"} Difficulty getting out of chair: {ACTIONS;DENIES/REPORTS:21021675::"Denies"} Difficulty using hands for taps, buttons, cutlery, and/or writing: {ACTIONS;DENIES/REPORTS:21021675::"Denies"}  No Rheumatology ROS completed.   PMFS History:  Patient Active Problem List   Diagnosis Date Noted  . Rheumatoid factor positive 11/06/2016  . Trochanteric bursitis of both hips 11/06/2016  . History of fibromyalgia 11/06/2016  . DDD (degenerative disc disease), cervical 11/06/2016  . Carpal tunnel syndrome, left upper limb 11/06/2016  . Carpal tunnel syndrome, right upper limb 11/06/2016  . Trigger finger, left middle finger 11/06/2016  . Major depressive disorder, recurrent episode, moderate (HCC) 04/27/2016  . Pain in left hand 04/25/2016  . Pain in right hand 04/25/2016  . Primary osteoarthritis of both hands 04/25/2016  . Primary osteoarthritis of both knees 04/25/2016  . Primary osteoarthritis of both feet 04/25/2016  . Fibromyalgia 02/17/2016  . Other insomnia 02/17/2016  . Fatigue 02/17/2016  . B12 DEFICIENCY 02/01/2010  . Vitamin D deficiency 02/01/2010  . ANEMIA, IRON DEFICIENCY 12/30/2009  . OTHER DYSPNEA AND RESPIRATORY ABNORMALITIES 12/30/2009  . Nonspecific (abnormal) findings on radiological and other examination of body  structure 12/30/2009  . COMPUTERIZED TOMOGRAPHY, CHEST, ABNORMAL 12/30/2009    Past Medical History:  Diagnosis Date  . Anemia   . Fibromyositis   . Hypertension   . Osteoarthritis   . PONV (postoperative nausea and vomiting)   . Vitamin D deficiency     Family History  Problem Relation Age of Onset  . Diabetes Mother   . Hypertension Mother   . Hypertension Father   . Hypertension Brother   . Osteoarthritis Brother   . Hypertension Brother   . Osteoarthritis Brother   . Endometriosis Daughter   . Anesthesia problems Neg Hx   . Hypotension Neg Hx   . Pseudochol deficiency Neg Hx   . Malignant hyperthermia Neg Hx    Past Surgical History:  Procedure Laterality Date  . APPENDECTOMY  2010   [facility]  . CARPAL TUNNEL RELEASE Left   . CHOLECYSTECTOMY  1999   lap, [facility]  . GASTRIC BYPASS  2003   [facility]  . HERNIA REPAIR  08/2010, 02/2010    incisional, [facility], [facility]  . INCISIONAL HERNIA REPAIR  10/12/2010   Procedure: HERNIA REPAIR INCISIONAL;  Surgeon: 12/13/2010;  Location: [facility];  Service: General;  Laterality: N/A;  Recurrent Incisional Hernia Repair with Mesh  . KNEE SURGERY Right 01/2018   meniscal tear   . KNEE SURGERY Right 12/2019  . PANNICULECTOMY  2008   [facility]  . SHOULDER SURGERY Left   . TENNIS ELBOW RELEASE/NIRSCHEL PROCEDURE Bilateral    Social History   Social History Narrative  . Not on file    There is no immunization history on file for this patient.   Objective: Vital Signs: There were no vitals taken for this visit.  Physical Exam   Musculoskeletal Exam: ***  CDAI Exam: CDAI Score: -- Patient Global: --; Provider Global: -- Swollen: --; Tender: -- Joint Exam 06/29/2020   No joint exam has been documented for this visit   There is currently no information documented on the homunculus. Go to the Rheumatology activity and complete the homunculus joint exam.  Investigation: No additional findings.  Imaging: No results found.  Recent  Labs: Lab Results  Component Value Date   WBC 8.1 05/03/2020   HGB 9.5 (L) 05/03/2020   PLT 351 05/03/2020   NA 136 05/03/2020   K 3.7 05/03/2020   CL 100 05/03/2020   CO2 29 05/03/2020   GLUCOSE 92 05/03/2020   BUN 20 05/03/2020   CREATININE 1.27 (H) 05/03/2020   BILITOT 0.3 05/03/2020   ALKPHOS 84 12/05/2009   AST 12 05/03/2020   ALT 11 05/03/2020   PROT 6.7 05/03/2020   ALBUMIN 3.1 (L) 12/05/2009   CALCIUM 9.0 05/03/2020   GFRAA 57 (L) 05/03/2020   QFTBGOLDPLUS NEGATIVE 10/27/2019    Speciality Comments: No specialty comments available.  Procedures:  No procedures performed Allergies: Patient has no known allergies.   Assessment / Plan:     Visit Diagnoses: No diagnosis found.  Orders: No orders of the defined types were placed in this encounter.  No orders of the defined types were placed in this encounter.   Face-to-face time spent with patient was *** minutes. Greater than 50% of time was spent in counseling and coordination of care.  Follow-Up Instructions: No follow-ups on file.   Ellen Henri, CMA  Note - This record has been created using Animal nutritionist.  Chart creation errors have been sought, but may not always  have been located. Such creation errors do not reflect on  the standard of medical care.

## 2020-06-29 ENCOUNTER — Ambulatory Visit: Payer: Medicare Other | Admitting: Physician Assistant

## 2020-06-29 DIAGNOSIS — R5383 Other fatigue: Secondary | ICD-10-CM

## 2020-06-29 DIAGNOSIS — Z9119 Patient's noncompliance with other medical treatment and regimen: Secondary | ICD-10-CM

## 2020-06-29 DIAGNOSIS — M7062 Trochanteric bursitis, left hip: Secondary | ICD-10-CM

## 2020-06-29 DIAGNOSIS — M0600 Rheumatoid arthritis without rheumatoid factor, unspecified site: Secondary | ICD-10-CM

## 2020-06-29 DIAGNOSIS — Z79899 Other long term (current) drug therapy: Secondary | ICD-10-CM

## 2020-06-29 DIAGNOSIS — G4709 Other insomnia: Secondary | ICD-10-CM

## 2020-06-29 DIAGNOSIS — F331 Major depressive disorder, recurrent, moderate: Secondary | ICD-10-CM

## 2020-06-29 DIAGNOSIS — M797 Fibromyalgia: Secondary | ICD-10-CM

## 2020-06-29 DIAGNOSIS — M5030 Other cervical disc degeneration, unspecified cervical region: Secondary | ICD-10-CM

## 2020-06-29 DIAGNOSIS — M170 Bilateral primary osteoarthritis of knee: Secondary | ICD-10-CM

## 2020-06-29 DIAGNOSIS — Z806 Family history of leukemia: Secondary | ICD-10-CM

## 2020-06-29 DIAGNOSIS — E559 Vitamin D deficiency, unspecified: Secondary | ICD-10-CM

## 2020-06-29 DIAGNOSIS — M19072 Primary osteoarthritis, left ankle and foot: Secondary | ICD-10-CM

## 2020-06-29 DIAGNOSIS — M19041 Primary osteoarthritis, right hand: Secondary | ICD-10-CM

## 2020-06-29 DIAGNOSIS — G5601 Carpal tunnel syndrome, right upper limb: Secondary | ICD-10-CM

## 2020-08-02 ENCOUNTER — Ambulatory Visit: Payer: Medicare Other | Admitting: Physician Assistant

## 2020-08-12 ENCOUNTER — Ambulatory Visit: Payer: Medicare Other | Admitting: Physician Assistant

## 2020-08-24 NOTE — Progress Notes (Deleted)
Office Visit Note  Patient: Amanda Hammond             Date of Birth: 08-18-1970           MRN: [id number]             PCP: Suzan Slick, MD Referring: Suzan Slick, MD Visit Date: 08/25/2020 Occupation: @GUAROCC @  Subjective:  No chief complaint on file.   History of Present Illness: Amanda Hammond is a 50 y.o. female ***   Activities of Daily Living:  Patient reports morning stiffness for *** {minute/hour:19697}.   Patient {ACTIONS;DENIES/REPORTS:21021675::"Denies"} nocturnal pain.  Difficulty dressing/grooming: {ACTIONS;DENIES/REPORTS:21021675::"Denies"} Difficulty climbing stairs: {ACTIONS;DENIES/REPORTS:21021675::"Denies"} Difficulty getting out of chair: {ACTIONS;DENIES/REPORTS:21021675::"Denies"} Difficulty using hands for taps, buttons, cutlery, and/or writing: {ACTIONS;DENIES/REPORTS:21021675::"Denies"}  No Rheumatology ROS completed.   PMFS History:  Patient Active Problem List   Diagnosis Date Noted  . Rheumatoid factor positive 11/06/2016  . Trochanteric bursitis of both hips 11/06/2016  . History of fibromyalgia 11/06/2016  . DDD (degenerative disc disease), cervical 11/06/2016  . Carpal tunnel syndrome, left upper limb 11/06/2016  . Carpal tunnel syndrome, right upper limb 11/06/2016  . Trigger finger, left middle finger 11/06/2016  . Major depressive disorder, recurrent episode, moderate (HCC) 04/27/2016  . Pain in left hand 04/25/2016  . Pain in right hand 04/25/2016  . Primary osteoarthritis of both hands 04/25/2016  . Primary osteoarthritis of both knees 04/25/2016  . Primary osteoarthritis of both feet 04/25/2016  . Fibromyalgia 02/17/2016  . Other insomnia 02/17/2016  . Fatigue 02/17/2016  . B12 DEFICIENCY 02/01/2010  . Vitamin D deficiency 02/01/2010  . ANEMIA, IRON DEFICIENCY 12/30/2009  . OTHER DYSPNEA AND RESPIRATORY ABNORMALITIES 12/30/2009  . Nonspecific (abnormal) findings on radiological and other examination of body  structure 12/30/2009  . COMPUTERIZED TOMOGRAPHY, CHEST, ABNORMAL 12/30/2009    Past Medical History:  Diagnosis Date  . Anemia   . Fibromyositis   . Hypertension   . Osteoarthritis   . PONV (postoperative nausea and vomiting)   . Vitamin D deficiency     Family History  Problem Relation Age of Onset  . Diabetes Mother   . Hypertension Mother   . Hypertension Father   . Hypertension Brother   . Osteoarthritis Brother   . Hypertension Brother   . Osteoarthritis Brother   . Endometriosis Daughter   . Anesthesia problems Neg Hx   . Hypotension Neg Hx   . Pseudochol deficiency Neg Hx   . Malignant hyperthermia Neg Hx    Past Surgical History:  Procedure Laterality Date  . APPENDECTOMY  2010   [facility]  . CARPAL TUNNEL RELEASE Left   . CHOLECYSTECTOMY  1999   lap, [facility]  . GASTRIC BYPASS  2003   [facility]  . HERNIA REPAIR  08/2010, 02/2010    incisional, [facility], [facility]  . INCISIONAL HERNIA REPAIR  10/12/2010   Procedure: HERNIA REPAIR INCISIONAL;  Surgeon: 12/13/2010;  Location: [facility];  Service: General;  Laterality: N/A;  Recurrent Incisional Hernia Repair with Mesh  . KNEE SURGERY Right 01/2018   meniscal tear   . KNEE SURGERY Right 12/2019  . PANNICULECTOMY  2008   [facility]  . SHOULDER SURGERY Left   . TENNIS ELBOW RELEASE/NIRSCHEL PROCEDURE Bilateral    Social History   Social History Narrative  . Not on file    There is no immunization history on file for this patient.   Objective: Vital Signs: There were no vitals taken for this visit.  Physical Exam   Musculoskeletal Exam: ***  CDAI Exam: CDAI Score: -- Patient Global: --; Provider Global: -- Swollen: --; Tender: -- Joint Exam 08/25/2020   No joint exam has been documented for this visit   There is currently no information documented on the homunculus. Go to the Rheumatology activity and complete the homunculus joint exam.  Investigation: No additional findings.  Imaging: No results found.  Recent  Labs: Lab Results  Component Value Date   WBC 8.1 05/03/2020   HGB 9.5 (L) 05/03/2020   PLT 351 05/03/2020   NA 136 05/03/2020   K 3.7 05/03/2020   CL 100 05/03/2020   CO2 29 05/03/2020   GLUCOSE 92 05/03/2020   BUN 20 05/03/2020   CREATININE 1.27 (H) 05/03/2020   BILITOT 0.3 05/03/2020   ALKPHOS 84 12/05/2009   AST 12 05/03/2020   ALT 11 05/03/2020   PROT 6.7 05/03/2020   ALBUMIN 3.1 (L) 12/05/2009   CALCIUM 9.0 05/03/2020   GFRAA 57 (L) 05/03/2020   QFTBGOLDPLUS NEGATIVE 10/27/2019    Speciality Comments: No specialty comments available.  Procedures:  No procedures performed Allergies: Patient has no known allergies.   Assessment / Plan:     Visit Diagnoses: No diagnosis found.  Orders: No orders of the defined types were placed in this encounter.  No orders of the defined types were placed in this encounter.   Face-to-face time spent with patient was *** minutes. Greater than 50% of time was spent in counseling and coordination of care.  Follow-Up Instructions: No follow-ups on file.   Gearldine Bienenstock, PA-C  Note - This record has been created using Dragon software.  Chart creation errors have been sought, but may not always  have been located. Such creation errors do not reflect on  the standard of medical care.

## 2020-08-25 ENCOUNTER — Ambulatory Visit: Payer: Medicare Other | Admitting: Physician Assistant

## 2020-08-25 DIAGNOSIS — M797 Fibromyalgia: Secondary | ICD-10-CM

## 2020-08-25 DIAGNOSIS — R5383 Other fatigue: Secondary | ICD-10-CM

## 2020-08-25 DIAGNOSIS — G4709 Other insomnia: Secondary | ICD-10-CM

## 2020-08-25 DIAGNOSIS — M7061 Trochanteric bursitis, right hip: Secondary | ICD-10-CM

## 2020-08-25 DIAGNOSIS — M5030 Other cervical disc degeneration, unspecified cervical region: Secondary | ICD-10-CM

## 2020-08-25 DIAGNOSIS — E559 Vitamin D deficiency, unspecified: Secondary | ICD-10-CM

## 2020-08-25 DIAGNOSIS — Z79899 Other long term (current) drug therapy: Secondary | ICD-10-CM

## 2020-08-25 DIAGNOSIS — G5601 Carpal tunnel syndrome, right upper limb: Secondary | ICD-10-CM

## 2020-08-25 DIAGNOSIS — M19071 Primary osteoarthritis, right ankle and foot: Secondary | ICD-10-CM

## 2020-08-25 DIAGNOSIS — F331 Major depressive disorder, recurrent, moderate: Secondary | ICD-10-CM

## 2020-08-25 DIAGNOSIS — Z9119 Patient's noncompliance with other medical treatment and regimen: Secondary | ICD-10-CM

## 2020-08-25 DIAGNOSIS — M19041 Primary osteoarthritis, right hand: Secondary | ICD-10-CM

## 2020-08-25 DIAGNOSIS — M170 Bilateral primary osteoarthritis of knee: Secondary | ICD-10-CM

## 2020-08-25 DIAGNOSIS — M0600 Rheumatoid arthritis without rheumatoid factor, unspecified site: Secondary | ICD-10-CM

## 2020-09-01 NOTE — Progress Notes (Signed)
Office Visit Note  Patient: Amanda Hammond             Date of Birth: [birth year]           MRN: [id number]             PCP: Suzan Slick, MD Referring: Suzan Slick, MD Visit Date: 09/02/2020 Occupation: @  Subjective:  Discuss restarting on Arava    History of Present Illness: Amanda Hammond is a 50 y.o. female with history of seronegative rheumatoid arthritis and osteoarthritis.  After the patient's last office visit she took Arava 10 mg 1 tablet by mouth daily for about 1 month.  According to the patient she noticed significant clinical improvement while taking Arava.  She did not return for lab work so she did not increase the dose of Arava to 20 mg as instructed.  She ran out of the prescription about 1 month ago and has noticed increased joint pain and inflammation since then.  She states her fibromyalgia has also been flaring.  She is currently having pain in the left shoulder, right wrist, both hands, and both knee joints.  She has ongoing swelling in both hands and the right knee joint.  She has also been experiencing increased lower back pain.  She denies any symptoms of radiculopathy.  She has been taking tramadol and zanaflex as needed for pain relief.  She remains on cymbalta and lyrica as prescribed.    Activities of Daily Living:  Patient reports morning stiffness for 1 hour.   Patient Reports nocturnal pain.  Difficulty dressing/grooming: Denies Difficulty climbing stairs: Reports Difficulty getting out of chair: Denies Difficulty using hands for taps, buttons, cutlery, and/or writing: Reports  Review of Systems  Constitutional: Positive for fatigue.  HENT: Positive for mouth dryness and nose dryness. Negative for mouth sores.   Eyes: Positive for visual disturbance and dryness. Negative for pain and itching.  Respiratory: Positive for shortness of breath. Negative for difficulty breathing.   Cardiovascular: Negative for chest pain and  palpitations.  Gastrointestinal: Negative for blood in stool, constipation and diarrhea.  Endocrine: Negative for increased urination.  Genitourinary: Negative for difficulty urinating.  Musculoskeletal: Positive for arthralgias, joint pain, joint swelling, myalgias, morning stiffness, muscle tenderness and myalgias.  Skin: Negative for color change, rash and redness.  Allergic/Immunologic: Negative for susceptible to infections.  Neurological: Positive for dizziness, numbness, headaches and weakness. Negative for memory loss.  Hematological: Negative for bruising/bleeding tendency.  Psychiatric/Behavioral: Negative for confusion.    PMFS History:  Patient Active Problem List   Diagnosis Date Noted  . Rheumatoid factor positive 11/06/2016  . Trochanteric bursitis of both hips 11/06/2016  . History of fibromyalgia 11/06/2016  . DDD (degenerative disc disease), cervical 11/06/2016  . Carpal tunnel syndrome, left upper limb 11/06/2016  . Carpal tunnel syndrome, right upper limb 11/06/2016  . Trigger finger, left middle finger 11/06/2016  . Major depressive disorder, recurrent episode, moderate (HCC) 04/27/2016  . Pain in left hand 04/25/2016  . Pain in right hand 04/25/2016  . Primary osteoarthritis of both hands 04/25/2016  . Primary osteoarthritis of both knees 04/25/2016  . Primary osteoarthritis of both feet 04/25/2016  . Fibromyalgia 02/17/2016  . Other insomnia 02/17/2016  . Fatigue 02/17/2016  . B12 DEFICIENCY 02/01/2010  . Vitamin D deficiency 02/01/2010  . ANEMIA, IRON DEFICIENCY 12/30/2009  . OTHER DYSPNEA AND RESPIRATORY ABNORMALITIES 12/30/2009  . Nonspecific (abnormal) findings on radiological and other examination of body structure 12/30/2009  .  COMPUTERIZED TOMOGRAPHY, CHEST, ABNORMAL 12/30/2009    Past Medical History:  Diagnosis Date  . Anemia   . Fibromyositis   . Hypertension   . Osteoarthritis   . PONV (postoperative nausea and vomiting)   . Vitamin D  deficiency     Family History  Problem Relation Age of Onset  . Diabetes Mother   . Hypertension Mother   . Hypertension Father   . Hypertension Brother   . Osteoarthritis Brother   . Hypertension Brother   . Osteoarthritis Brother   . Endometriosis Daughter   . Anesthesia problems Neg Hx   . Hypotension Neg Hx   . Pseudochol deficiency Neg Hx   . Malignant hyperthermia Neg Hx    Past Surgical History:  Procedure Laterality Date  . APPENDECTOMY  2010   [facility]  . CARPAL TUNNEL RELEASE Left   . CHOLECYSTECTOMY  1999   lap, [facility]  . GASTRIC BYPASS  2003   [facility]  . HERNIA REPAIR  08/2010, 02/2010    incisional, [facility], [facility]  . INCISIONAL HERNIA REPAIR  10/12/2010   Procedure: HERNIA REPAIR INCISIONAL;  Surgeon: Dalia Heading;  Location: [facility];  Service: General;  Laterality: N/A;  Recurrent Incisional Hernia Repair with Mesh  . KNEE SURGERY Right 01/2018   meniscal tear   . KNEE SURGERY Right 12/2019  . PANNICULECTOMY  2008   [facility]  . SHOULDER SURGERY Left   . TENNIS ELBOW RELEASE/NIRSCHEL PROCEDURE Bilateral    Social History   Social History Narrative  . Not on file    There is no immunization history on file for this patient.   Objective: Vital Signs: BP 109/74 (BP Location: Left Arm, Patient Position: Sitting, Cuff Size: Normal)   Pulse 77   Resp 18   Ht  (1.626 m)   Wt 252 lb 12.8 oz (114.7 kg)   BMI 43.39 kg/m    Physical Exam Vitals and nursing note reviewed.  Constitutional:      Appearance: She is well-developed.  HENT:     Head: Normocephalic and atraumatic.  Eyes:     Conjunctiva/sclera: Conjunctivae normal.  Pulmonary:     Effort: Pulmonary effort is normal.  Abdominal:     Palpations: Abdomen is soft.  Musculoskeletal:     Cervical back: Normal range of motion.  Skin:    General: Skin is warm and dry.     Capillary Refill: Capillary refill takes less than 2 seconds.  Neurological:     Mental Status: She is alert and oriented to person,  place, and time.  Psychiatric:        Behavior: Behavior normal.      Musculoskeletal Exam: Generalized hyperalgesia and positive tender points on exam.  C-spine slightly limited ROM with lateral rotation.  Trapezius muscle tension and tenderness bilaterally.  Midline spinal tenderness in the thoracic and lumbar region.  Painful and limited ROM of the left shoulder joint.  Abduction to about 120 degrees and limited internal rotation of the left shoulder.  Right shoulder has good ROM with no discomfort.  Elbow joints good ROM with no tenderness or inflammation.  Painful ROM of the right wrist.  Synovitis over the right wrist noted.  Tenderness and synovitis of the right 2nd MCP and PIP joint.  Tenderness over the right 3rd MCP joint.  Painful ROM of the right knee. Warmth and swelling of the right knee noted.  Left knee has good ROM with some discomfort.  No warmth or effusion of  the left knee joint.  Ankle joints good ROM with no tenderness or joint swelling.   CDAI Exam: CDAI Score: 11.2  Patient Global: 6 mm; Provider Global: 6 mm Swollen: 4 ; Tender: 6  Joint Exam 09/02/2020      Right  Left  Glenohumeral      Tender  Wrist  Swollen Tender     MCP 2  Swollen Tender     MCP 3   Tender     PIP 2  Swollen Tender     Knee  Swollen Tender        Investigation: No additional findings.  Imaging: No results found.  Recent Labs: Lab Results  Component Value Date   WBC 8.1 05/03/2020   HGB 9.5 (L) 05/03/2020   PLT 351 05/03/2020   NA 136 05/03/2020   K 3.7 05/03/2020   CL 100 05/03/2020   CO2 29 05/03/2020   GLUCOSE 92 05/03/2020   BUN 20 05/03/2020   CREATININE 1.27 (H) 05/03/2020   BILITOT 0.3 05/03/2020   ALKPHOS 84 12/05/2009   AST 12 05/03/2020   ALT 11 05/03/2020   PROT 6.7 05/03/2020   ALBUMIN 3.1 (L) 12/05/2009   CALCIUM 9.0 05/03/2020   GFRAA 57 (L) 05/03/2020   QFTBGOLDPLUS NEGATIVE 10/27/2019    Speciality Comments: No specialty comments  available.  Procedures:  No procedures performed Allergies: Patient has no known allergies.   Assessment / Plan:     Visit Diagnoses: Seronegative rheumatoid arthritis (HCC): She presents today with pain and inflammation in multiple joints as described above.  She has tenderness and synovitis over the right wrist joint and right knee joint.  Tenderness and synovitis over the right second MCP and PIP joint.  After her last office visit she took Arava 10 mg 1 tablet by mouth daily for about 1 month.  She tolerated Arava without any side effects.  She has noticed significant clinical improvement on Arava but was unable to have updated lab work or return office visit so she ran out of the prescription.  She has been off of Arava for about 1 month and her joint pain and inflammation has returned.  We will update CBC and CMP today.  A prescription for Arava 20 mg 1 tablet by mouth daily will be sent to the pharmacy tomorrow pending lab results.  A prednisone taper starting 20 mg tapering by 5 mg every 4 days was sent to the pharmacy today.  We discussed the importance of staying compliant taking Arava as prescribed as well as returning for lab monitoring as discussed.  She will follow-up in the office in 2 months to assess her response.  Medication counseling:   Baseline Immunosuppressant Therapy Labs  Quantiferon TB Gold Latest Ref Rng & Units 10/27/2019  Quantiferon TB Gold Plus NEGATIVE NEGATIVE    Hepatitis Latest Ref Rng & Units 10/27/2019  Hep B Surface Ag NON-REACTI NON-REACTIVE  Hep B IgM NON-REACTI NON-REACTIVE  Hep C Ab NON-REACTI NON-REACTIVE  Hep C Ab NON-REACTI NON-REACTIVE  Hep A IgM NON-REACTI NON-REACTIVE    Lab Results  Component Value Date   HIV NON-REACTIVE 10/27/2019    Immunoglobulin Electrophoresis Latest Ref Rng & Units 10/27/2019  IgA  47 - 310 mg/dL 623  IgG 762 - 8,315 mg/dL 176  IgM 50 - 160 mg/dL 73(X)    Serum Protein Electrophoresis Latest Ref Rng & Units  05/03/2020  Total Protein 6.1 - 8.1 g/dL 6.7  Albumin 3.8 - 4.8 g/dL -  Alpha-1 0.2 - 0.3 g/dL -  Alpha-2 0.5 - 0.9 g/dL -  Beta Globulin 0.4 - 0.6 g/dL -  Beta 2 0.2 - 0.5 g/dL -  Gamma Globulin 0.8 - 1.7 g/dL -    Lab Results  Component Value Date   G6PDH 26.6 (H) 06/05/2019   Patient was counseled on the purpose, proper use, and adverse effects of leflunomide including risk of infection, nausea/diarrhea/weight loss, increase in blood pressure, rash, hair loss, tingling in the hands and feet, and signs and symptoms of interstitial lung disease.   Also counseled on Black Box warning of liver injury and importance of avoiding alcohol while on therapy. Discussed that there is the possibility of an increased risk of malignancy but it is not well understood if this increased risk is due to the medication or the disease state.  Counseled patient to avoid live vaccines. Recommend annual influenza, Pneumovax 23, Prevnar 13, and Shingrix as indicated.   Discussed the importance of frequent monitoring of liver function and blood count.  Standing orders placed.   Provided patient with educational materials on leflunomide and answered all questions.  Patient consented to [place] use, and consent will be uploaded into the media tab.   Patient dose will be 20 mg 1 tablet daily.  Prescription pending lab results.   High risk medication use -She will be restarting on Arava 20 mg 1 tablet by mouth daily pending lab results.  CBC and CMP were drawn today.  Her next lab work will be due in 2 weeks, 2 months, then every 3 months.  Standing orders for CBC and CMP were placed today.  Plan: CBC with Differential/Platelet, COMPLETE METABOLIC PANEL WITH GFR, CBC with Differential/Platelet, COMPLETE METABOLIC PANEL WITH GFR She has not had any recent infections.  We discussed the importance of holding Arava if she develops signs or symptoms of an infection and to resume once the infection has completely cleared. She has  not received the COVID-19 vaccines and does not plan to at this time.  Primary osteoarthritis of both hands: She has PIP and DIP thickening consistent with osteoarthritis.  She has been experiencing severe pain and intermittent inflammation in the right wrist joint and both hands due to uncontrolled rheumatoid arthritis.  She has been having to wear her right carpal tunnel splint for support.  Discussed the importance of joint protection and muscle strengthening.   Carpal tunnel syndrome, right upper limb: She wears a carpal tunnel splint as needed when her symptoms flare.  Trochanteric bursitis of both hips: She has ongoing discomfort due to trochanteric bursitis of both hips. Discussed the importance of performing daily stretching exercises.   Primary osteoarthritis of both knees: Chronic pain.  She has painful, limited ROM of the right knee joint on exam today.  Slightly limited extension with warmth and swelling in the right knee.  Left knee has good ROM with some discomfort but no warmth or effusion noted. She has been using a cane to assist with ambulation.   Primary osteoarthritis of both feet: She is not experiencing any increased discomfort in her feet at this time.  No tenderness to palpation over MTP joints on exam.  Ankle joints have good ROM with no discomfort or tenderness.   DDD (degenerative disc disease), cervical: She has limited ROM with lateral rotation.  No symptoms of radiculopathy.  She has trapezius muscle tension and tenderness bilaterally.    Fibromyalgia: She has generalized hyperalgesia and positive tender points on exam.  She has  been having frequent and severe fibromyalgia flares over the past several months.  She has generalized myalgias and muscle tenderness. Trapezius muscle tension and tenderness bilaterally.  She has ongoing discomfort due to trochanteric bursitis.  She has been experiencing increased discomfort in her lower back over the past 1 month.  She had a fall  3 weeks ago which exacerbated her discomfort.  According to the patient she has a bulging disc but she has not seen her orthopedist recently.  She declined x-rays today.  She continues to take Lyrica 200 mg 3 times daily, tizanidine 4 mg every 8 hours as needed for muscle spasms, tramadol 50 mg 1 tablet by mouth 4 times daily as needed for pain relief.  She remains on Cymbalta 90 mg daily.  We discussed the importance of regular exercise and good sleep hygiene.  Other insomnia: She has difficulty sleeping at night due to nocturnal pain.  She has been taking Topamax 100 mg at bedtime, tramadol 50 mg 1 tablet 4 times daily as needed for pain relief and tizanidine 4 mg every 8 hours as needed for muscle spasms.  Other fatigue: She continues to have significant fatigue secondary to insomnia.  We discussed the importance of regular exercise.  Vitamin D deficiency  Major depressive disorder, recurrent episode, moderate (HCC): She is taking Cymbalta 90 mg daily.  Noncompliance: Discussed the importance of staying compliant taking her medications as well as coming back for frequent lab monitoring.  Orders: Orders Placed This Encounter  Procedures  . CBC with Differential/Platelet  . COMPLETE METABOLIC PANEL WITH GFR  . CBC with Differential/Platelet  . COMPLETE METABOLIC PANEL WITH GFR   Meds ordered this encounter  Medications  . predniSONE (DELTASONE) 5 MG tablet    Sig: Take 4 tablets by mouth daily x4 days, 3 tablets by mouth daily x4 days, 2 tablets by mouth daily x4 days, 1 tablet by mouth daily x4 days.    Dispense:  40 tablet    Refill:  0    Follow-Up Instructions: Return in about 2 months (around 11/02/2020) for Rheumatoid arthritis.   Gearldine Bienenstock, PA-C  Note - This record has been created using Dragon software.  Chart creation errors have been sought, but may not always  have been located. Such creation errors do not reflect on  the standard of medical care.

## 2020-09-02 ENCOUNTER — Ambulatory Visit (INDEPENDENT_AMBULATORY_CARE_PROVIDER_SITE_OTHER): Payer: Medicare Other | Admitting: Physician Assistant

## 2020-09-02 ENCOUNTER — Encounter: Payer: Self-pay | Admitting: Physician Assistant

## 2020-09-02 ENCOUNTER — Other Ambulatory Visit: Payer: Self-pay

## 2020-09-02 VITALS — BP 109/74 | HR 77 | Resp 18 | Ht 64.0 in | Wt 252.8 lb

## 2020-09-02 DIAGNOSIS — M797 Fibromyalgia: Secondary | ICD-10-CM

## 2020-09-02 DIAGNOSIS — M7061 Trochanteric bursitis, right hip: Secondary | ICD-10-CM

## 2020-09-02 DIAGNOSIS — Z79899 Other long term (current) drug therapy: Secondary | ICD-10-CM

## 2020-09-02 DIAGNOSIS — Z91199 Patient's noncompliance with other medical treatment and regimen due to unspecified reason: Secondary | ICD-10-CM

## 2020-09-02 DIAGNOSIS — F331 Major depressive disorder, recurrent, moderate: Secondary | ICD-10-CM

## 2020-09-02 DIAGNOSIS — M0600 Rheumatoid arthritis without rheumatoid factor, unspecified site: Secondary | ICD-10-CM | POA: Diagnosis not present

## 2020-09-02 DIAGNOSIS — M170 Bilateral primary osteoarthritis of knee: Secondary | ICD-10-CM

## 2020-09-02 DIAGNOSIS — M5030 Other cervical disc degeneration, unspecified cervical region: Secondary | ICD-10-CM

## 2020-09-02 DIAGNOSIS — M7062 Trochanteric bursitis, left hip: Secondary | ICD-10-CM

## 2020-09-02 DIAGNOSIS — G5601 Carpal tunnel syndrome, right upper limb: Secondary | ICD-10-CM | POA: Diagnosis not present

## 2020-09-02 DIAGNOSIS — G4709 Other insomnia: Secondary | ICD-10-CM

## 2020-09-02 DIAGNOSIS — Z9119 Patient's noncompliance with other medical treatment and regimen: Secondary | ICD-10-CM

## 2020-09-02 DIAGNOSIS — E559 Vitamin D deficiency, unspecified: Secondary | ICD-10-CM

## 2020-09-02 DIAGNOSIS — M19042 Primary osteoarthritis, left hand: Secondary | ICD-10-CM

## 2020-09-02 DIAGNOSIS — M19071 Primary osteoarthritis, right ankle and foot: Secondary | ICD-10-CM

## 2020-09-02 DIAGNOSIS — R5383 Other fatigue: Secondary | ICD-10-CM

## 2020-09-02 DIAGNOSIS — M19072 Primary osteoarthritis, left ankle and foot: Secondary | ICD-10-CM

## 2020-09-02 DIAGNOSIS — M19041 Primary osteoarthritis, right hand: Secondary | ICD-10-CM | POA: Diagnosis not present

## 2020-09-02 MED ORDER — PREDNISONE 5 MG PO TABS
ORAL_TABLET | ORAL | 0 refills | Status: DC
Start: 2020-09-02 — End: 2020-10-19

## 2020-09-02 NOTE — Patient Instructions (Addendum)
Standing Labs We placed an order today for your standing lab work.   Please have your standing labs drawn in 2 weeks, 2 months, and every 3 months   If possible, please have your labs drawn 2 weeks prior to your appointment so that the provider can discuss your results at your appointment.  Please note that you may see your imaging and lab results in MyChart before we have reviewed them. We may be awaiting multiple results to interpret others before contacting you. Please allow our office up to 72 hours to thoroughly review all of the results before contacting the office for clarification of your results.  We have open lab daily: Monday through Thursday from 1:30-4:30 PM and Friday from 1:30-4:00 PM at the office of Dr. Pollyann Savoy, [facility].   Please be advised, all patients with office appointments requiring lab work will take precedent over walk-in lab work.  If possible, please come for your lab work on Monday and Friday afternoons, as you may experience shorter wait times. The office is located at [address], [place], [place] [address] No appointment is necessary.   Labs are drawn by [facility]. Please bring your co-pay at the time of your lab draw.  You may receive a bill from [facility] for your lab work.  If you wish to have your labs drawn at another location, please call the office 24 hours in advance to send orders.  If you have any questions regarding directions or hours of operation,  please call [phone].   As a reminder, please drink plenty of water prior to coming for your lab work. Thanks! Leflunomide tablets What is this medicine? LEFLUNOMIDE (le FLOO na mide) is for rheumatoid arthritis. This medicine may be used for other purposes; ask your health care provider or pharmacist if you have questions. COMMON BRAND NAME(S): Arava What should I tell my health care provider before I take this medicine? They need to know if you have any of  these conditions:  diabetes  have a fever or infection  high blood pressure  immune system problems  kidney disease  liver disease  low blood cell counts, like low white cell, platelet, or red cell counts  lung or breathing disease, like asthma  recently received or scheduled to receive a vaccine  receiving treatment for cancer  skin conditions or sensitivity  tingling of the fingers or toes, or other nerve disorder  tuberculosis  an unusual or allergic reaction to leflunomide, teriflunomide, other medicines, food, dyes, or preservatives  pregnant or trying to get pregnant  breast-feeding How should I use this medicine? Take this medicine by mouth with a full glass of water. Follow the directions on the prescription label. Take your medicine at regular intervals. Do not take your medicine more often than directed. Do not stop taking except on your doctor's advice. Talk to your pediatrician regarding the use of this medicine in children. Special care may be needed. Overdosage: If you think you have taken too much of this medicine contact a poison control center or emergency room at once. NOTE: This medicine is only for you. Do not share this medicine with others. What if I miss a dose? If you miss a dose, take it as soon as you can. If it is almost time for your next dose, take only that dose. Do not take double or extra doses. What may interact with this medicine? Do not take this medicine with any of the following medications:  teriflunomide This medicine may also interact with the following medications:  alosetron  birth control pills  caffeine  cefaclor  certain medicines for diabetes like nateglinide, repaglinide, rosiglitazone, pioglitazone  certain medicines for high cholesterol like atorvastatin, pravastatin, rosuvastatin, simvastatin  charcoal  cholestyramine  ciprofloxacin  duloxetine  furosemide  ketoprofen  live virus  vaccines  medicines that increase your risk for infection  methotrexate  mitoxantrone  paclitaxel  penicillin  theophylline  tizanidine  warfarin This list may not describe all possible interactions. Give your health care provider a list of all the medicines, herbs, non-prescription drugs, or dietary supplements you use. Also tell them if you smoke, drink alcohol, or use illegal drugs. Some items may interact with your medicine. What should I watch for while using this medicine? Visit your health care provider for regular checks on your progress. Tell your doctor or health care provider if your symptoms do not start to get better or if they get worse. You may need blood work done while you are taking this medicine. This medicine may cause serious skin reactions. They can happen weeks to months after starting the medicine. Contact your health care provider right away if you notice fevers or flu-like symptoms with a rash. The rash may be red or purple and then turn into blisters or peeling of the skin. Or, you might notice a red rash with swelling of the face, lips or lymph nodes in your neck or under your arms. This medicine may stay in your body for up to 2 years after your last dose. Tell your doctor about any unusual side effects or symptoms. A medicine can be given to help lower your blood levels of this medicine more quickly. Women must use effective birth control with this medicine. There is a potential for serious side effects to an unborn child. Do not become pregnant while taking this medicine. Inform your doctor if you wish to become pregnant. This medicine remains in your blood after you stop taking it. You must continue using effective birth control until the blood levels have been checked and they are low enough. A medicine can be given to help lower your blood levels of this medicine more quickly. Immediately talk to your doctor if you think you may be pregnant. You may need a  pregnancy test. Talk to your health care provider or pharmacist for more information. You should not receive certain vaccines during your treatment and for a certain time after your treatment with this medication ends. Talk to your health care provider for more information. What side effects may I notice from receiving this medicine? Side effects that you should report to your doctor or health care professional as soon as possible:  allergic reactions like skin rash, itching or hives, swelling of the face, lips, or tongue  breathing problems  cough  increased blood pressure  low blood counts - this medicine may decrease the number of white blood cells and platelets. You may be at increased risk for infections and bleeding.  pain, tingling, numbness in the hands or feet  rash, fever, and swollen lymph nodes  redness, blistering, peeing or loosening of the skin, including inside the mouth  signs of decreased platelets or bleeding - bruising, pinpoint red spots on the skin, black, tarry stools, blood in urine  signs of infection - fever or chills, cough, sore throat, pain or trouble passing urine  signs and symptoms of liver injury like dark yellow or brown urine; general ill feeling  or flu-like symptoms; light-colored stools; loss of appetite; nausea; right upper belly pain; unusually weak or tired; yellowing of the eyes or skin  trouble passing urine or change in the amount of urine  vomiting Side effects that usually do not require medical attention (report to your doctor or health care professional if they continue or are bothersome):  diarrhea  hair thinning or loss  headache  nausea  tiredness This list may not describe all possible side effects. Call your doctor for medical advice about side effects. You may report side effects to FDA at 1-800-FDA-1088. Where should I keep my medicine? Keep out of the reach of children. Store at room temperature between 15 and 30  degrees C (59 and 86 degrees F). Protect from moisture and light. Throw away any unused medicine after the expiration date. NOTE: This sheet is a summary. It may not cover all possible information. If you have questions about this medicine, talk to your doctor, pharmacist, or health care provider.  2021 Elsevier/Gold Standard (2018-06-21 15:06:48)

## 2020-09-03 ENCOUNTER — Other Ambulatory Visit: Payer: Self-pay | Admitting: *Deleted

## 2020-09-03 DIAGNOSIS — D649 Anemia, unspecified: Secondary | ICD-10-CM

## 2020-09-03 LAB — CBC WITH DIFFERENTIAL/PLATELET
Absolute Monocytes: 615 cells/uL (ref 200–950)
Basophils Absolute: 57 cells/uL (ref 0–200)
Basophils Relative: 0.7 %
Eosinophils Absolute: 189 cells/uL (ref 15–500)
Eosinophils Relative: 2.3 %
HCT: 29.3 % — ABNORMAL LOW (ref 35.0–45.0)
Hemoglobin: 8.9 g/dL — ABNORMAL LOW (ref 11.7–15.5)
Lymphs Abs: 3190 cells/uL (ref 850–3900)
MCH: 21.8 pg — ABNORMAL LOW (ref 27.0–33.0)
MCHC: 30.4 g/dL — ABNORMAL LOW (ref 32.0–36.0)
MCV: 71.6 fL — ABNORMAL LOW (ref 80.0–100.0)
MPV: 10.1 fL (ref 7.5–12.5)
Monocytes Relative: 7.5 %
Neutro Abs: 4149 cells/uL (ref 1500–7800)
Neutrophils Relative %: 50.6 %
Platelets: 343 10*3/uL (ref 140–400)
RBC: 4.09 10*6/uL (ref 3.80–5.10)
RDW: 17 % — ABNORMAL HIGH (ref 11.0–15.0)
Total Lymphocyte: 38.9 %
WBC: 8.2 10*3/uL (ref 3.8–10.8)

## 2020-09-03 LAB — COMPLETE METABOLIC PANEL WITH GFR
AG Ratio: 1.6 (calc) (ref 1.0–2.5)
ALT: 11 U/L (ref 6–29)
AST: 12 U/L (ref 10–35)
Albumin: 3.8 g/dL (ref 3.6–5.1)
Alkaline phosphatase (APISO): 99 U/L (ref 31–125)
BUN/Creatinine Ratio: 16 (calc) (ref 6–22)
BUN: 18 mg/dL (ref 7–25)
CO2: 26 mmol/L (ref 20–32)
Calcium: 9 mg/dL (ref 8.6–10.2)
Chloride: 104 mmol/L (ref 98–110)
Creat: 1.12 mg/dL — ABNORMAL HIGH (ref 0.50–1.10)
GFR, Est African American: 67 mL/min/{1.73_m2} (ref >=60)
GFR, Est Non African American: 58 mL/min/{1.73_m2} — ABNORMAL LOW (ref >=60)
Globulin: 2.4 g/dL (calc) (ref 1.9–3.7)
Glucose, Bld: 86 mg/dL (ref 65–99)
Potassium: 4 mmol/L (ref 3.5–5.3)
Sodium: 138 mmol/L (ref 135–146)
Total Bilirubin: 0.3 mg/dL (ref 0.2–1.2)
Total Protein: 6.2 g/dL (ref 6.1–8.1)

## 2020-09-03 MED ORDER — LEFLUNOMIDE 10 MG PO TABS: 10.0000 mg | ORAL_TABLET | Freq: Every day | ORAL | 0 refills | Status: DC

## 2020-09-03 NOTE — Progress Notes (Signed)
Creatinine is slightly elevated-1.12 and GFR is borderline low-58.  Hemoglobin and hematocrit are very low.  Please clarify if the patient has seen an oncologist in the past or if she is undergoing a workup by her PCP for the cause of her anemia?  Please refer the patient to hematology if she has not seen one in the patient.   Reviewed lab work with Dr. Corliss Skains.  She is ok with the patient restarting on arava, but we recommend starting on 10 mg daily.  Return for lab work in 2 weeks.

## 2020-09-16 ENCOUNTER — Encounter (HOSPITAL_COMMUNITY): Payer: Self-pay

## 2020-09-16 NOTE — Progress Notes (Signed)
Patient is going to reschedule new patient appointment.

## 2020-09-17 ENCOUNTER — Ambulatory Visit (HOSPITAL_COMMUNITY): Payer: Medicare Other | Admitting: Hematology

## 2020-09-29 ENCOUNTER — Ambulatory Visit (HOSPITAL_COMMUNITY): Payer: Medicare Other | Admitting: Hematology

## 2020-10-19 ENCOUNTER — Other Ambulatory Visit: Payer: Self-pay

## 2020-10-19 ENCOUNTER — Encounter (HOSPITAL_COMMUNITY): Payer: Self-pay | Admitting: Surgery

## 2020-10-20 NOTE — Progress Notes (Deleted)
Office Visit Note  Patient: Amanda Hammond             Date of Birth: [birth year]           MRN: [id number]             PCP: Suzan Slick, MD Referring: Suzan Slick, MD Visit Date: 11/03/2020 Occupation: @GUAROCC @  Subjective:  No chief complaint on file.   History of Present Illness: [name] is a 50 y.o. female ***   Activities of Daily Living:  Patient reports morning stiffness for *** {minute/hour:19697}.   Patient {ACTIONS;DENIES/REPORTS:21021675::"Denies"} nocturnal pain.  Difficulty dressing/grooming: {ACTIONS;DENIES/REPORTS:21021675::"Denies"} Difficulty climbing stairs: {ACTIONS;DENIES/REPORTS:21021675::"Denies"} Difficulty getting out of chair: {ACTIONS;DENIES/REPORTS:21021675::"Denies"} Difficulty using hands for taps, buttons, cutlery, and/or writing: {ACTIONS;DENIES/REPORTS:21021675::"Denies"}  No Rheumatology ROS completed.   PMFS History:  Patient Active Problem List   Diagnosis Date Noted   Rheumatoid factor positive 11/06/2016   Trochanteric bursitis of both hips 11/06/2016   History of fibromyalgia 11/06/2016   DDD (degenerative disc disease), cervical 11/06/2016   Carpal tunnel syndrome, left upper limb 11/06/2016   Carpal tunnel syndrome, right upper limb 11/06/2016   Trigger finger, left middle finger 11/06/2016   Major depressive disorder, recurrent episode, moderate (HCC) 04/27/2016   Pain in left hand 04/25/2016   Pain in right hand 04/25/2016   Primary osteoarthritis of both hands 04/25/2016   Primary osteoarthritis of both knees 04/25/2016   Primary osteoarthritis of both feet 04/25/2016   Fibromyalgia 02/17/2016   Other insomnia 02/17/2016   Fatigue 02/17/2016   B12 DEFICIENCY 02/01/2010   Vitamin D deficiency 02/01/2010   ANEMIA, IRON DEFICIENCY 12/30/2009   OTHER DYSPNEA AND RESPIRATORY ABNORMALITIES 12/30/2009   Nonspecific (abnormal) findings on radiological and other examination of body structure 12/30/2009    COMPUTERIZED TOMOGRAPHY, CHEST, ABNORMAL 12/30/2009    Past Medical History:  Diagnosis Date   Anemia    Fibromyalgia    Hypertension    Osteoarthritis    PONV (postoperative nausea and vomiting)    Rheumatoid arthritis (HCC)    Vitamin D deficiency     Family History  Problem Relation Age of Onset   Diabetes Mother    Hypertension Mother    Hypertension Father    Hypertension Brother    Osteoarthritis Brother    Hypertension Brother    Osteoarthritis Brother    Endometriosis Daughter    Anesthesia problems Neg Hx    Hypotension Neg Hx    Pseudochol deficiency Neg Hx    Malignant hyperthermia Neg Hx    Past Surgical History:  Procedure Laterality Date   APPENDECTOMY  2010   [facility]   CARPAL TUNNEL RELEASE Left    CHOLECYSTECTOMY  1999   lap, [facility]   GASTRIC BYPASS  2003   [facility]   HERNIA REPAIR  08/2010, 02/2010    incisional, [facility], [facility]   INCISIONAL HERNIA REPAIR  10/12/2010   Procedure: HERNIA REPAIR INCISIONAL;  Surgeon: 12/13/2010;  Location: [facility];  Service: General;  Laterality: N/A;  Recurrent Incisional Hernia Repair with Mesh   KNEE SURGERY Right 01/2018   meniscal tear    KNEE SURGERY Right 12/2019   PANNICULECTOMY  2008   [facility]   SHOULDER SURGERY Left    TENNIS ELBOW RELEASE/NIRSCHEL PROCEDURE Bilateral    Social History   Social History Narrative   Not on file    There is no immunization history on file for this patient.   Objective: Vital Signs: There were no vitals  taken for this visit.   Physical Exam   Musculoskeletal Exam: ***  CDAI Exam: CDAI Score: -- Patient Global: --; Provider Global: -- Swollen: --; Tender: -- Joint Exam 11/03/2020   No joint exam has been documented for this visit   There is currently no information documented on the homunculus. Go to the Rheumatology activity and complete the homunculus joint exam.  Investigation: No additional findings.  Imaging: No results found.  Recent Labs: Lab Results   Component Value Date   WBC 8.2 09/02/2020   HGB 8.9 (L) 09/02/2020   PLT 343 09/02/2020   NA 138 09/02/2020   K 4.0 09/02/2020   CL 104 09/02/2020   CO2 26 09/02/2020   GLUCOSE 86 09/02/2020   BUN 18 09/02/2020   CREATININE 1.12 (H) 09/02/2020   BILITOT 0.3 09/02/2020   ALKPHOS 84 12/05/2009   AST 12 09/02/2020   ALT 11 09/02/2020   PROT 6.2 09/02/2020   ALBUMIN 3.1 (L) 12/05/2009   CALCIUM 9.0 09/02/2020   GFRAA 67 09/02/2020   QFTBGOLDPLUS NEGATIVE 10/27/2019    Speciality Comments: No specialty comments available.  Procedures:  No procedures performed Allergies: Patient has no known allergies.   Assessment / Plan:     Visit Diagnoses: No diagnosis found.  Orders: No orders of the defined types were placed in this encounter.  No orders of the defined types were placed in this encounter.   Face-to-face time spent with patient was *** minutes. Greater than 50% of time was spent in counseling and coordination of care.  Follow-Up Instructions: No follow-ups on file.   Ellen Henri, CMA  Note - This record has been created using Animal nutritionist.  Chart creation errors have been sought, but may not always  have been located. Such creation errors do not reflect on  the standard of medical care.

## 2020-10-22 ENCOUNTER — Ambulatory Visit (HOSPITAL_COMMUNITY): Payer: Medicare Other | Admitting: Hematology and Oncology

## 2020-11-02 NOTE — Progress Notes (Deleted)
Office Visit Note  Patient: Amanda Hammond             Date of Birth: 1971/02/10           MRN: [id number]             PCP: Suzan Slick, MD Referring: Suzan Slick, MD Visit Date: 11/09/2020 Occupation: @GUAROCC @  Subjective:  No chief complaint on file.   History of Present Illness: Amanda Hammond is a 50 y.o. female ***   Activities of Daily Living:  Patient reports morning stiffness for *** {minute/hour:19697}.   Patient {ACTIONS;DENIES/REPORTS:21021675::"Denies"} nocturnal pain.  Difficulty dressing/grooming: {ACTIONS;DENIES/REPORTS:21021675::"Denies"} Difficulty climbing stairs: {ACTIONS;DENIES/REPORTS:21021675::"Denies"} Difficulty getting out of chair: {ACTIONS;DENIES/REPORTS:21021675::"Denies"} Difficulty using hands for taps, buttons, cutlery, and/or writing: {ACTIONS;DENIES/REPORTS:21021675::"Denies"}  No Rheumatology ROS completed.   PMFS History:  Patient Active Problem List   Diagnosis Date Noted   Rheumatoid factor positive 11/06/2016   Trochanteric bursitis of both hips 11/06/2016   History of fibromyalgia 11/06/2016   DDD (degenerative disc disease), cervical 11/06/2016   Carpal tunnel syndrome, left upper limb 11/06/2016   Carpal tunnel syndrome, right upper limb 11/06/2016   Trigger finger, left middle finger 11/06/2016   Major depressive disorder, recurrent episode, moderate (HCC) 04/27/2016   Pain in left hand 04/25/2016   Pain in right hand 04/25/2016   Primary osteoarthritis of both hands 04/25/2016   Primary osteoarthritis of both knees 04/25/2016   Primary osteoarthritis of both feet 04/25/2016   Fibromyalgia 02/17/2016   Other insomnia 02/17/2016   Fatigue 02/17/2016   B12 DEFICIENCY 02/01/2010   Vitamin D deficiency 02/01/2010   ANEMIA, IRON DEFICIENCY 12/30/2009   OTHER DYSPNEA AND RESPIRATORY ABNORMALITIES 12/30/2009   Nonspecific (abnormal) findings on radiological and other examination of body structure 12/30/2009    COMPUTERIZED TOMOGRAPHY, CHEST, ABNORMAL 12/30/2009    Past Medical History:  Diagnosis Date   Anemia    Fibromyalgia    Hypertension    Osteoarthritis    PONV (postoperative nausea and vomiting)    Rheumatoid arthritis (HCC)    Vitamin D deficiency     Family History  Problem Relation Age of Onset   Diabetes Mother    Hypertension Mother    Hypertension Father    Hypertension Brother    Osteoarthritis Brother    Hypertension Brother    Osteoarthritis Brother    Endometriosis Daughter    Anesthesia problems Neg Hx    Hypotension Neg Hx    Pseudochol deficiency Neg Hx    Malignant hyperthermia Neg Hx    Past Surgical History:  Procedure Laterality Date   APPENDECTOMY  2010   [facility]   CARPAL TUNNEL RELEASE Left    CHOLECYSTECTOMY  1999   lap, [facility]   GASTRIC BYPASS  2003   [facility]   HERNIA REPAIR  08/2010, 02/2010    incisional, [facility], [facility]   INCISIONAL HERNIA REPAIR  10/12/2010   Procedure: HERNIA REPAIR INCISIONAL;  Surgeon: 12/13/2010;  Location: [facility];  Service: General;  Laterality: N/A;  Recurrent Incisional Hernia Repair with Mesh   KNEE SURGERY Right 01/2018   meniscal tear    KNEE SURGERY Right 12/2019   PANNICULECTOMY  2008   [facility]   SHOULDER SURGERY Left    TENNIS ELBOW RELEASE/NIRSCHEL PROCEDURE Bilateral    Social History   Social History Narrative   Not on file    There is no immunization history on file for this patient.   Objective: Vital Signs: There were no vitals  taken for this visit.   Physical Exam   Musculoskeletal Exam: ***  CDAI Exam: CDAI Score: -- Patient Global: --; Provider Global: -- Swollen: --; Tender: -- Joint Exam 11/09/2020   No joint exam has been documented for this visit   There is currently no information documented on the homunculus. Go to the Rheumatology activity and complete the homunculus joint exam.  Investigation: No additional findings.  Imaging: No results found.  Recent Labs: Lab Results   Component Value Date   WBC 8.2 09/02/2020   HGB 8.9 (L) 09/02/2020   PLT 343 09/02/2020   NA 138 09/02/2020   K 4.0 09/02/2020   CL 104 09/02/2020   CO2 26 09/02/2020   GLUCOSE 86 09/02/2020   BUN 18 09/02/2020   CREATININE 1.12 (H) 09/02/2020   BILITOT 0.3 09/02/2020   ALKPHOS 84 12/05/2009   AST 12 09/02/2020   ALT 11 09/02/2020   PROT 6.2 09/02/2020   ALBUMIN 3.1 (L) 12/05/2009   CALCIUM 9.0 09/02/2020   GFRAA 67 09/02/2020   QFTBGOLDPLUS NEGATIVE 10/27/2019    Speciality Comments: No specialty comments available.  Procedures:  No procedures performed Allergies: Patient has no known allergies.   Assessment / Plan:     Visit Diagnoses: No diagnosis found.  Orders: No orders of the defined types were placed in this encounter.  No orders of the defined types were placed in this encounter.   Face-to-face time spent with patient was *** minutes. Greater than 50% of time was spent in counseling and coordination of care.  Follow-Up Instructions: No follow-ups on file.   Ellen Henri, CMA  Note - This record has been created using Animal nutritionist.  Chart creation errors have been sought, but may not always  have been located. Such creation errors do not reflect on  the standard of medical care.

## 2020-11-03 ENCOUNTER — Ambulatory Visit: Payer: Medicare Other | Admitting: Rheumatology

## 2020-11-03 DIAGNOSIS — E559 Vitamin D deficiency, unspecified: Secondary | ICD-10-CM

## 2020-11-03 DIAGNOSIS — M19071 Primary osteoarthritis, right ankle and foot: Secondary | ICD-10-CM

## 2020-11-03 DIAGNOSIS — M5030 Other cervical disc degeneration, unspecified cervical region: Secondary | ICD-10-CM

## 2020-11-03 DIAGNOSIS — Z9119 Patient's noncompliance with other medical treatment and regimen: Secondary | ICD-10-CM

## 2020-11-03 DIAGNOSIS — Z79899 Other long term (current) drug therapy: Secondary | ICD-10-CM

## 2020-11-03 DIAGNOSIS — M0600 Rheumatoid arthritis without rheumatoid factor, unspecified site: Secondary | ICD-10-CM

## 2020-11-03 DIAGNOSIS — M170 Bilateral primary osteoarthritis of knee: Secondary | ICD-10-CM

## 2020-11-03 DIAGNOSIS — M797 Fibromyalgia: Secondary | ICD-10-CM

## 2020-11-03 DIAGNOSIS — G4709 Other insomnia: Secondary | ICD-10-CM

## 2020-11-03 DIAGNOSIS — F331 Major depressive disorder, recurrent, moderate: Secondary | ICD-10-CM

## 2020-11-03 DIAGNOSIS — R5383 Other fatigue: Secondary | ICD-10-CM

## 2020-11-03 DIAGNOSIS — G5601 Carpal tunnel syndrome, right upper limb: Secondary | ICD-10-CM

## 2020-11-03 DIAGNOSIS — M19041 Primary osteoarthritis, right hand: Secondary | ICD-10-CM

## 2020-11-03 DIAGNOSIS — M7062 Trochanteric bursitis, left hip: Secondary | ICD-10-CM

## 2020-11-09 ENCOUNTER — Ambulatory Visit: Payer: Medicare Other | Admitting: Physician Assistant

## 2020-11-09 DIAGNOSIS — F331 Major depressive disorder, recurrent, moderate: Secondary | ICD-10-CM

## 2020-11-09 DIAGNOSIS — M19041 Primary osteoarthritis, right hand: Secondary | ICD-10-CM

## 2020-11-09 DIAGNOSIS — Z9119 Patient's noncompliance with other medical treatment and regimen: Secondary | ICD-10-CM

## 2020-11-09 DIAGNOSIS — M797 Fibromyalgia: Secondary | ICD-10-CM

## 2020-11-09 DIAGNOSIS — G5601 Carpal tunnel syndrome, right upper limb: Secondary | ICD-10-CM

## 2020-11-09 DIAGNOSIS — M19071 Primary osteoarthritis, right ankle and foot: Secondary | ICD-10-CM

## 2020-11-09 DIAGNOSIS — M0600 Rheumatoid arthritis without rheumatoid factor, unspecified site: Secondary | ICD-10-CM

## 2020-11-09 DIAGNOSIS — Z79899 Other long term (current) drug therapy: Secondary | ICD-10-CM

## 2020-11-09 DIAGNOSIS — R5383 Other fatigue: Secondary | ICD-10-CM

## 2020-11-09 DIAGNOSIS — M5030 Other cervical disc degeneration, unspecified cervical region: Secondary | ICD-10-CM

## 2020-11-09 DIAGNOSIS — G4709 Other insomnia: Secondary | ICD-10-CM

## 2020-11-09 DIAGNOSIS — M170 Bilateral primary osteoarthritis of knee: Secondary | ICD-10-CM

## 2020-11-09 DIAGNOSIS — M7061 Trochanteric bursitis, right hip: Secondary | ICD-10-CM

## 2020-11-09 DIAGNOSIS — E559 Vitamin D deficiency, unspecified: Secondary | ICD-10-CM

## 2020-12-01 ENCOUNTER — Ambulatory Visit: Payer: Medicare HMO | Admitting: Physician Assistant

## 2020-12-01 ENCOUNTER — Encounter: Payer: Self-pay | Admitting: Physician Assistant

## 2020-12-01 ENCOUNTER — Other Ambulatory Visit: Payer: Self-pay

## 2020-12-01 VITALS — BP 107/74 | HR 80 | Ht 65.0 in | Wt 241.0 lb

## 2020-12-01 DIAGNOSIS — M19042 Primary osteoarthritis, left hand: Secondary | ICD-10-CM

## 2020-12-01 DIAGNOSIS — M0600 Rheumatoid arthritis without rheumatoid factor, unspecified site: Secondary | ICD-10-CM | POA: Diagnosis not present

## 2020-12-01 DIAGNOSIS — F331 Major depressive disorder, recurrent, moderate: Secondary | ICD-10-CM

## 2020-12-01 DIAGNOSIS — G5601 Carpal tunnel syndrome, right upper limb: Secondary | ICD-10-CM | POA: Diagnosis not present

## 2020-12-01 DIAGNOSIS — M7062 Trochanteric bursitis, left hip: Secondary | ICD-10-CM

## 2020-12-01 DIAGNOSIS — M797 Fibromyalgia: Secondary | ICD-10-CM

## 2020-12-01 DIAGNOSIS — M19071 Primary osteoarthritis, right ankle and foot: Secondary | ICD-10-CM

## 2020-12-01 DIAGNOSIS — M19041 Primary osteoarthritis, right hand: Secondary | ICD-10-CM | POA: Diagnosis not present

## 2020-12-01 DIAGNOSIS — Z9119 Patient's noncompliance with other medical treatment and regimen: Secondary | ICD-10-CM

## 2020-12-01 DIAGNOSIS — G4709 Other insomnia: Secondary | ICD-10-CM

## 2020-12-01 DIAGNOSIS — Z79899 Other long term (current) drug therapy: Secondary | ICD-10-CM | POA: Diagnosis not present

## 2020-12-01 DIAGNOSIS — E559 Vitamin D deficiency, unspecified: Secondary | ICD-10-CM

## 2020-12-01 DIAGNOSIS — Z91199 Patient's noncompliance with other medical treatment and regimen due to unspecified reason: Secondary | ICD-10-CM

## 2020-12-01 DIAGNOSIS — M62838 Other muscle spasm: Secondary | ICD-10-CM | POA: Diagnosis not present

## 2020-12-01 DIAGNOSIS — R5383 Other fatigue: Secondary | ICD-10-CM

## 2020-12-01 DIAGNOSIS — M19072 Primary osteoarthritis, left ankle and foot: Secondary | ICD-10-CM

## 2020-12-01 DIAGNOSIS — M7061 Trochanteric bursitis, right hip: Secondary | ICD-10-CM

## 2020-12-01 DIAGNOSIS — M170 Bilateral primary osteoarthritis of knee: Secondary | ICD-10-CM

## 2020-12-01 DIAGNOSIS — M5030 Other cervical disc degeneration, unspecified cervical region: Secondary | ICD-10-CM

## 2020-12-01 MED ORDER — LIDOCAINE HCL 1 % IJ SOLN
0.5000 mL | INTRAMUSCULAR | Status: AC | PRN
Start: 2020-12-01 — End: 2020-12-01
  Administered 2020-12-01: .5 mL

## 2020-12-01 MED ORDER — LIDOCAINE HCL 1 % IJ SOLN
0.5000 mL | INTRAMUSCULAR | Status: AC | PRN
  Administered 2020-12-01: .5 mL

## 2020-12-01 MED ORDER — TRIAMCINOLONE ACETONIDE 40 MG/ML IJ SUSP
10.0000 mg | INTRAMUSCULAR | Status: AC | PRN
  Administered 2020-12-01: 10 mg via INTRAMUSCULAR

## 2020-12-01 MED ORDER — PREDNISONE 5 MG PO TABS: ORAL_TABLET | ORAL | 0 refills | Status: DC

## 2020-12-01 NOTE — Progress Notes (Signed)
Office Visit Note  Patient: Amanda Hammond             Date of Birth: [birth year]           MRN: [id number]             PCP: Suzan Slick, MD Referring: Suzan Slick, MD Visit Date: 12/01/2020 Occupation: @  Subjective:  Discuss increasing the dose of arava  History of Present Illness: Amanda Hammond is a 50 y.o. female with history of seronegative rheumatoid, osteoarthritis, and fibromyalgia.  She is taking Arava 10 mg 1 tablet by mouth daily.  She denies missing any doses of Arava recently.  She has noticed about a 15% improvement in her joint pain or inflammation since starting on Arava.  She did not return for lab work after her last office visit so she has not increased the dose of Arava as previously discussed.  She would like to have updated lab work today in order to determine if we can increase the dose of Arava.  She continues to have severe pain and intermittent swelling in both hands, both wrists, both feet.  She has been taking tramadol and oxycodone for pain relief and remains on Lyrica and Cymbalta to help manage her fibromyalgia.  She continues to have generalized myalgias and muscle tenderness on a daily basis.  Her level of fatigue remains high.  She has interrupted sleep at night due to nocturnal pain.  Activities of Daily Living:  Patient reports morning stiffness for 1 hour.   Patient Reports nocturnal pain.  Difficulty dressing/grooming: Denies Difficulty climbing stairs: Reports Difficulty getting out of chair: Reports Difficulty using hands for taps, buttons, cutlery, and/or writing: Reports  Review of Systems  Constitutional:  Positive for fatigue.  HENT:  Positive for mouth dryness and nose dryness. Negative for mouth sores.   Eyes:  Negative for pain, itching and dryness.  Respiratory:  Positive for shortness of breath. Negative for difficulty breathing.   Cardiovascular:  Negative for chest pain and palpitations.  Gastrointestinal:   Positive for constipation and diarrhea. Negative for blood in stool.  Endocrine: Negative for increased urination.  Genitourinary:  Negative for difficulty urinating.  Musculoskeletal:  Positive for joint pain, joint pain, joint swelling, myalgias, morning stiffness, muscle tenderness and myalgias.  Skin:  Negative for color change, rash and redness.  Allergic/Immunologic: Negative for susceptible to infections.  Neurological:  Positive for dizziness, numbness, headaches and weakness. Negative for memory loss.  Hematological:  Negative for bruising/bleeding tendency.  Psychiatric/Behavioral:  Negative for confusion.    PMFS History:  Patient Active Problem List   Diagnosis Date Noted   Rheumatoid factor positive 11/06/2016   Trochanteric bursitis of both hips 11/06/2016   History of fibromyalgia 11/06/2016   DDD (degenerative disc disease), cervical 11/06/2016   Carpal tunnel syndrome, left upper limb 11/06/2016   Carpal tunnel syndrome, right upper limb 11/06/2016   Trigger finger, left middle finger 11/06/2016   Major depressive disorder, recurrent episode, moderate (HCC) 04/27/2016   Pain in left hand 04/25/2016   Pain in right hand 04/25/2016   Primary osteoarthritis of both hands 04/25/2016   Primary osteoarthritis of both knees 04/25/2016   Primary osteoarthritis of both feet 04/25/2016   Fibromyalgia 02/17/2016   Other insomnia 02/17/2016   Fatigue 02/17/2016   B12 DEFICIENCY 02/01/2010   Vitamin D deficiency 02/01/2010   ANEMIA, IRON DEFICIENCY 12/30/2009   OTHER DYSPNEA AND RESPIRATORY ABNORMALITIES 12/30/2009   Nonspecific (abnormal) findings on  radiological and other examination of body structure 12/30/2009   COMPUTERIZED TOMOGRAPHY, CHEST, ABNORMAL 12/30/2009    Past Medical History:  Diagnosis Date   Anemia    Fibromyalgia    Hypertension    Osteoarthritis    PONV (postoperative nausea and vomiting)    Rheumatoid arthritis (HCC)    Vitamin D deficiency      Family History  Problem Relation Age of Onset   Diabetes Mother    Hypertension Mother    Hypertension Father    Hypertension Brother    Osteoarthritis Brother    Hypertension Brother    Osteoarthritis Brother    Endometriosis Daughter    Anesthesia problems Neg Hx    Hypotension Neg Hx    Pseudochol deficiency Neg Hx    Malignant hyperthermia Neg Hx    Past Surgical History:  Procedure Laterality Date   APPENDECTOMY  2010   [facility]   CARPAL TUNNEL RELEASE Left    CHOLECYSTECTOMY  1999   lap, [facility]   GASTRIC BYPASS  2003   [facility]   HERNIA REPAIR  08/2010, 02/2010    incisional, [facility], [facility]   INCISIONAL HERNIA REPAIR  10/12/2010   Procedure: HERNIA REPAIR INCISIONAL;  Surgeon: Dalia Heading;  Location: [facility];  Service: General;  Laterality: N/A;  Recurrent Incisional Hernia Repair with Mesh   KNEE SURGERY Right 01/2018   meniscal tear    KNEE SURGERY Right 12/2019   PANNICULECTOMY  2008   [facility]   SHOULDER SURGERY Left    TENNIS ELBOW RELEASE/NIRSCHEL PROCEDURE Bilateral    Social History   Social History Narrative   Not on file    There is no immunization history on file for this patient.   Objective: Vital Signs: BP 107/74 (BP Location: Right Arm, Patient Position: Sitting, Cuff Size: Normal)   Pulse 80   Ht  (1.651 m)   Wt 241 lb (109.3 kg)   BMI 40.10 kg/m    Physical Exam Vitals and nursing note reviewed.  Constitutional:      Appearance: She is well-developed.  HENT:     Head: Normocephalic and atraumatic.  Eyes:     Conjunctiva/sclera: Conjunctivae normal.  Pulmonary:     Effort: Pulmonary effort is normal.  Abdominal:     Palpations: Abdomen is soft.  Musculoskeletal:     Cervical back: Normal range of motion.  Skin:    General: Skin is warm and dry.     Capillary Refill: Capillary refill takes less than 2 seconds.  Neurological:     Mental Status: She is alert and oriented to person, place, and time.  Psychiatric:        Behavior: Behavior  normal.     Musculoskeletal Exam: C-spine limited ROM with lateral rotation.  Trapezius muscle tension and tenderness bilaterally.  Shoulder joints have painful and limited ROM.  Elbow joints have good ROM with no tenderness.  Tenderness and synovitis of both wrist joints, right > left.  Tenderness and synovitis of the right 2nd and 3rd MCP joint.  Tenderness of all MCP joints.  PIP and DIP thickening consistent with OA of both hands.  Difficulty making a complete fist.  Limited extension of the right knee.  Warmth and synovitis of the right knee.  Left knee has good ROM with no warmth or effusion.  Ankle joints have good ROM with no tenderness or swelling.   CDAI Exam: CDAI Score: 17.4  Patient Global: 8 mm; Provider Global: 6 mm Swollen: 5 ;  Tender: 11  Joint Exam 12/01/2020      Right  Left  Wrist  Swollen Tender  Swollen Tender  MCP 2  Swollen Tender   Tender  MCP 3  Swollen Tender   Tender  MCP 4   Tender   Tender  MCP 5   Tender   Tender  Knee  Swollen Tender        Investigation: No additional findings.  Imaging: No results found.  Recent Labs: Lab Results  Component Value Date   WBC 8.2 09/02/2020   HGB 8.9 (L) 09/02/2020   PLT 343 09/02/2020   NA 138 09/02/2020   K 4.0 09/02/2020   CL 104 09/02/2020   CO2 26 09/02/2020   GLUCOSE 86 09/02/2020   BUN 18 09/02/2020   CREATININE 1.12 (H) 09/02/2020   BILITOT 0.3 09/02/2020   ALKPHOS 84 12/05/2009   AST 12 09/02/2020   ALT 11 09/02/2020   PROT 6.2 09/02/2020   ALBUMIN 3.1 (L) 12/05/2009   CALCIUM 9.0 09/02/2020   GFRAA 67 09/02/2020   QFTBGOLDPLUS NEGATIVE 10/27/2019    Speciality Comments: No specialty comments available.  Procedures:  Trigger Point Inj  Date/Time: 12/01/2020 3:02 PM Performed by: Gearldine Bienenstock, PA-C Authorized by: Gearldine Bienenstock, PA-C   Consent Given by:  Patient Site marked: the procedure site was marked   Timeout: prior to procedure the correct patient, procedure, and site was  verified   Indications:  Pain Total # of Trigger Points:  2 Location: neck   Needle Size:  27 G Approach:  Dorsal Medications #1:  0.5 mL lidocaine 1 %; 10 mg triamcinolone acetonide 40 MG/ML Medications #2:  0.5 mL lidocaine 1 %; 10 mg triamcinolone acetonide 40 MG/ML Patient tolerance:  Patient tolerated the procedure well with no immediate complications Allergies: Patient has no known allergies.   Assessment / Plan:     Visit Diagnoses: Seronegative rheumatoid arthritis (HCC): She presents today with ongoing pain and inflammation involving multiple joints.  She has tenderness and synovitis of both wrist joints most severe in her right wrist.  Tenderness and synovitis over the right second and third MCP joints was also noted.  She has ongoing warmth and limited extension with significant discomfort in the right knee joint.  She is currently taking Arava 10 mg 1 tablet by mouth daily.  She had lab work drawn on 09/02/2020 at which time she remained anemic: Hemoglobin was 8.9 at that time.  A referral to hematology was placed for further evaluation but she did not schedule an appointment.  We discussed that it will be difficult to increase the dose of Arava or add any other medications on his combination therapy if she remains severely anemic.  She voiced understanding and will schedule appointment with the hematologist for further evaluation.  CBC and CMP will be updated today to monitor for drug toxicity.  Pending her lab results we discussed increasing the dose of Arava to 20 mg daily to try to alleviate some of the joint pain and inflammation she is experiencing.  She previously was taking Plaquenil and discontinued due to loss of follow-up and requiring updated lab work.  She had previously tolerated Plaquenil without any side effects.  We discussed that another option may be to keep her on the reduced dose of Arava 10 mg daily and to add Plaquenil on as combination therapy.  She is apprehensive to  try any Biologics or injectable medications at this time.  Discussed that due  to the aggressive nature of her rheumatoid arthritis as well as erosive changes she will benefit from more aggressive immunosuppression but she declined at this time.  A prednisone taper starting at 20 mg tapering by 5 mg every 4 days was sent to the pharmacy today. She will continue to require close follow-up as well as frequent lab monitoring.  High risk medication use - Arava 10 mg 1 tablet by mouth daily.  If her labs are stable we will try increasing the dose of her regular to 20 mg daily.  CBC and CMP were drawn on 09/02/2020.  Results were reviewed with the patient today in the office.  A referral to hematology was placed but she has not scheduled an appointment yet. She was strongly encouraged to schedule an appointment with hematology for further evaluation of her chronic anemia.  CBC and CMP were drawn today.  We discussed the importance of remaining compliant with lab work.  She will require lab work in 2 weeks, 2 months, then every 3 months.  Standing orders for CBC and CMP remain in place. She previously tried taking Plaquenil but discontinued due to loss of follow-up and requiring updated lab work.  She previously tolerated Plaquenil without any side effects.  Discussed that we could consider adding on Plaquenil as combination therapy if her lab work is stable. She is hesitant to try biologic agents.  She does not want to take an injectable medication at this time.- Plan: CBC with Differential/Platelet, COMPLETE METABOLIC PANEL WITH GFR Discussed the importance of holding Arava if she develops signs or symptoms of an infection and to resume once infection has completely cleared.  Primary osteoarthritis of both hands: She has PIP and DIP thickening consistent with osteoarthritis of both hands.  She continues to experience severe pain and stiffness in both hands.  She has tenderness and synovitis of the right second and  third MCP joints.  She has difficulty making a complete fist due to severity of pain and stiffness in her hands.  She has synovitis of both wrist joints on examination today.  We discussed the importance of joint protection and muscle strengthening.  Carpal tunnel syndrome, right upper limb: She experiences intermittent symptoms of carpal tunnel syndrome.  She has ongoing tenderness and synovitis of the right wrist which is likely contributing to some of her symptoms.  Trochanteric bursitis of both hips: She has ongoing discomfort due to trochanter bursitis of both hips.  Discussed the importance of performing stretching exercises daily.  Primary osteoarthritis of both knees: She experiences interval discomfort in the left knee joint but has good range of motion with no warmth or effusion on examination today.  She continues to have chronic severe pain in her right knee.  She has limited extension with warmth and swelling in the right knee on examination today.  A prednisone taper starting at 20 mg tapering by 5 mg every 4 days was sent to the pharmacy.  Primary osteoarthritis of both feet: She has been experiencing increased pain and stiffness in both feet.  She has noticed intermittent swelling in her toes.  DDD (degenerative disc disease), cervical: She has limited range of motion with lateral rotation.  No symptoms of radiculopathy at this time.  Trapezius muscle spasm: She has been experiencing increased trapezius muscle tension and tenderness bilaterally.  She is at muscle spasms intermittently.  Different treatment options were discussed today.  Trigger point injections were performed bilaterally after informed consent.  Procedure note completed above.  Aftercare was discussed.  She will continue to take tizanidine 2 to 4 mg every 8 hours as needed for muscle spasms.  Fibromyalgia: She has generalized hyperalgesia and positive tender points on examination today.  She continues to have frequent  and severe fibromyalgia flares.  She presents today with increased trapezius muscle tension and tenderness bilaterally.  Trigger point injections were performed bilaterally.  She continues to have discomfort due to trochanter bursitis of both hips and was strongly encouraged to perform stretching exercises daily.  We discussed the importance of regular exercise and good sleep hygiene.  She will continue to take tramadol, oxycodone, Lyrica, and Cymbalta as prescribed.  Other fatigue: Chronic and secondary to insomnia.  She has difficulty exercising due to the severity of pain and fatigue she has been experiencing.  Other insomnia - She continues to have interrupted sleep at night due to nocturnal pain.  She takes tramadol and oxycodone for pain relief.   Major depressive disorder, recurrent episode, moderate (HCC) -She remains on Cymbalta 60 mg every morning and 30 mg at bedtime as well as Wellbutrin 300 mg daily.  Vitamin D deficiency  Noncompliance  Orders: Orders Placed This Encounter  Procedures   Trigger Point Inj   CBC with Differential/Platelet   COMPLETE METABOLIC PANEL WITH GFR   Meds ordered this encounter  Medications   predniSONE (DELTASONE) 5 MG tablet    Sig: Take 4 tablets by mouth daily x4 days, 3 tablets by mouth x4 days, 2 tablets by mouth x4 days, 1 tablet by mouth x4 days.    Dispense:  40 tablet    Refill:  0      Follow-Up Instructions: Return in about 3 months (around 03/02/2021) for Rheumatoid arthritis, Fibromyalgia, Osteoarthritis.   Gearldine Bienenstock, PA-C  Note - This record has been created using Dragon software.  Chart creation errors have been sought, but may not always  have been located. Such creation errors do not reflect on  the standard of medical care.

## 2020-12-01 NOTE — Patient Instructions (Signed)
Standing Labs We placed an order today for your standing lab work.   Please have your standing labs drawn in 2 weeks, 2 months, then every 3 months   If possible, please have your labs drawn 2 weeks prior to your appointment so that the provider can discuss your results at your appointment.  Please note that you may see your imaging and lab results in MyChart before we have reviewed them. We may be awaiting multiple results to interpret others before contacting you. Please allow our office up to 72 hours to thoroughly review all of the results before contacting the office for clarification of your results.  We have open lab daily: Monday through Thursday from 1:30-4:30 PM and Friday from 1:30-4:00 PM at the office of Dr. Pollyann Savoy, [facility].   Please be advised, all patients with office appointments requiring lab work will take precedent over walk-in lab work.  If possible, please come for your lab work on Monday and Friday afternoons, as you may experience shorter wait times. The office is located at [address], [place], [place] [address] No appointment is necessary.   Labs are drawn by [facility]. Please bring your co-pay at the time of your lab draw.  You may receive a bill from [facility] for your lab work.  If you wish to have your labs drawn at another location, please call the office 24 hours in advance to send orders.  If you have any questions regarding directions or hours of operation,  please call [phone].   As a reminder, please drink plenty of water prior to coming for your lab work. Thanks!

## 2020-12-02 ENCOUNTER — Other Ambulatory Visit: Payer: Self-pay

## 2020-12-02 LAB — CBC WITH DIFFERENTIAL/PLATELET
Absolute Monocytes: 561 cells/uL (ref 200–950)
Basophils Absolute: 47 cells/uL (ref 0–200)
Basophils Relative: 0.6 %
Eosinophils Absolute: 119 cells/uL (ref 15–500)
Eosinophils Relative: 1.5 %
HCT: 34.5 % — ABNORMAL LOW (ref 35.0–45.0)
Hemoglobin: 10.2 g/dL — ABNORMAL LOW (ref 11.7–15.5)
Lymphs Abs: 2876 cells/uL (ref 850–3900)
MCH: 21.8 pg — ABNORMAL LOW (ref 27.0–33.0)
MCHC: 29.6 g/dL — ABNORMAL LOW (ref 32.0–36.0)
MCV: 73.9 fL — ABNORMAL LOW (ref 80.0–100.0)
MPV: 9.9 fL (ref 7.5–12.5)
Monocytes Relative: 7.1 %
Neutro Abs: 4298 cells/uL (ref 1500–7800)
Neutrophils Relative %: 54.4 %
Platelets: 373 10*3/uL (ref 140–400)
RBC: 4.67 10*6/uL (ref 3.80–5.10)
RDW: 16.6 % — ABNORMAL HIGH (ref 11.0–15.0)
Total Lymphocyte: 36.4 %
WBC: 7.9 10*3/uL (ref 3.8–10.8)

## 2020-12-02 LAB — COMPLETE METABOLIC PANEL WITH GFR
AG Ratio: 1.7 (calc) (ref 1.0–2.5)
ALT: 13 U/L (ref 6–29)
AST: 14 U/L (ref 10–35)
Albumin: 4 g/dL (ref 3.6–5.1)
Alkaline phosphatase (APISO): 108 U/L (ref 31–125)
BUN/Creatinine Ratio: 11 (calc) (ref 6–22)
BUN: 12 mg/dL (ref 7–25)
CO2: 25 mmol/L (ref 20–32)
Calcium: 8.5 mg/dL — ABNORMAL LOW (ref 8.6–10.2)
Chloride: 101 mmol/L (ref 98–110)
Creat: 1.06 mg/dL — ABNORMAL HIGH (ref 0.50–0.99)
Globulin: 2.3 g/dL (calc) (ref 1.9–3.7)
Glucose, Bld: 84 mg/dL (ref 65–99)
Potassium: 4 mmol/L (ref 3.5–5.3)
Sodium: 135 mmol/L (ref 135–146)
Total Bilirubin: 0.4 mg/dL (ref 0.2–1.2)
Total Protein: 6.3 g/dL (ref 6.1–8.1)
eGFR: 64 mL/min/{1.73_m2} (ref >=60)

## 2020-12-02 MED ORDER — LEFLUNOMIDE 20 MG PO TABS: 20.0000 mg | ORAL_TABLET | Freq: Every day | ORAL | 0 refills | Status: DC

## 2020-12-02 NOTE — Progress Notes (Signed)
Hemoglobin and hematocrit remain low but have improved.   Creatinine is borderline elevated-1.06. eGFR WNL.   Calcium is borderline low-8.5.  Please advise the patient to increase dietary intake of calcium.  Ok to increase arava to 20 mg daily.  Please advise the patient to have updated lab work in 2 weeks.   Please see if there is somewhere closer to her home for her to have lab work drawn to try to improve compliance with lab work.

## 2020-12-02 NOTE — Progress Notes (Unsigned)
Patient requested a new prescription for the arava 75m.   Next Visit: 04/05/2021  Last Visit: 12/01/2020  Last Fill: 09/03/2020  DX: Seronegative rheumatoid arthritis   Current Dose per office note on 12/01/2020: Arava 10 mg 1 tablet by mouth daily.  If her labs are stable we will try increasing the dose of her regular to 20 mg daily.  Labs: 12/01/2020 Hemoglobin and hematocrit remain low but have improved.   Creatinine is borderline elevated-1.06. eGFR WNL.   Calcium is borderline low-8.5.  Please advise the patient to increase dietary intake of calcium.   Ok to increase arava to 20 mg daily.  Please advise the patient to have updated lab work in 2 weeks.    Okay to refill arava?

## 2021-04-05 ENCOUNTER — Other Ambulatory Visit: Payer: Self-pay | Admitting: *Deleted

## 2021-04-05 ENCOUNTER — Ambulatory Visit: Payer: Medicare HMO | Admitting: Rheumatology

## 2021-04-05 NOTE — Telephone Encounter (Signed)
Patient overdue for labs. Will need updated labs prior to refill.

## 2021-04-13 ENCOUNTER — Ambulatory Visit: Payer: Self-pay

## 2021-04-13 ENCOUNTER — Other Ambulatory Visit: Payer: Self-pay

## 2021-04-13 ENCOUNTER — Encounter: Payer: Self-pay | Admitting: Physician Assistant

## 2021-04-13 ENCOUNTER — Telehealth: Payer: Self-pay

## 2021-04-13 ENCOUNTER — Ambulatory Visit: Payer: Medicare HMO | Admitting: Physician Assistant

## 2021-04-13 VITALS — BP 90/50 | HR 93 | Resp 16 | Ht 64.0 in | Wt 244.0 lb

## 2021-04-13 DIAGNOSIS — M19072 Primary osteoarthritis, left ankle and foot: Secondary | ICD-10-CM

## 2021-04-13 DIAGNOSIS — M19041 Primary osteoarthritis, right hand: Secondary | ICD-10-CM

## 2021-04-13 DIAGNOSIS — M7061 Trochanteric bursitis, right hip: Secondary | ICD-10-CM

## 2021-04-13 DIAGNOSIS — M0600 Rheumatoid arthritis without rheumatoid factor, unspecified site: Secondary | ICD-10-CM

## 2021-04-13 DIAGNOSIS — M7062 Trochanteric bursitis, left hip: Secondary | ICD-10-CM

## 2021-04-13 DIAGNOSIS — M797 Fibromyalgia: Secondary | ICD-10-CM

## 2021-04-13 DIAGNOSIS — R5383 Other fatigue: Secondary | ICD-10-CM

## 2021-04-13 DIAGNOSIS — M19042 Primary osteoarthritis, left hand: Secondary | ICD-10-CM | POA: Diagnosis not present

## 2021-04-13 DIAGNOSIS — Z79899 Other long term (current) drug therapy: Secondary | ICD-10-CM

## 2021-04-13 DIAGNOSIS — M5030 Other cervical disc degeneration, unspecified cervical region: Secondary | ICD-10-CM

## 2021-04-13 DIAGNOSIS — G4709 Other insomnia: Secondary | ICD-10-CM

## 2021-04-13 DIAGNOSIS — M170 Bilateral primary osteoarthritis of knee: Secondary | ICD-10-CM

## 2021-04-13 DIAGNOSIS — G5601 Carpal tunnel syndrome, right upper limb: Secondary | ICD-10-CM | POA: Diagnosis not present

## 2021-04-13 DIAGNOSIS — E559 Vitamin D deficiency, unspecified: Secondary | ICD-10-CM

## 2021-04-13 DIAGNOSIS — F331 Major depressive disorder, recurrent, moderate: Secondary | ICD-10-CM

## 2021-04-13 DIAGNOSIS — M19071 Primary osteoarthritis, right ankle and foot: Secondary | ICD-10-CM

## 2021-04-13 DIAGNOSIS — Z111 Encounter for screening for respiratory tuberculosis: Secondary | ICD-10-CM

## 2021-04-13 DIAGNOSIS — M25461 Effusion, right knee: Secondary | ICD-10-CM

## 2021-04-13 MED ORDER — PREDNISONE 5 MG PO TABS: ORAL_TABLET | ORAL | 0 refills | Status: DC

## 2021-04-13 NOTE — Telephone Encounter (Signed)
Please apply for Orencia per Hazel Sams, PA-C. Thanks!   Consent obtained and sent to the scan center.

## 2021-04-13 NOTE — Telephone Encounter (Signed)
Submitted an URGENT Prior Authorization request to [facility] for [facility] via CoverMyMeds. Patient will likely be denied and will require appeal. Will update once we receive a response.  Key: Gertie Gowda, PharmD, MPH, BCPS Clinical Pharmacist (Rheumatology and Pulmonology)

## 2021-04-13 NOTE — Progress Notes (Signed)
Please notify patient of x-ray results.   X-rays of both hands are consistent with RA and OA overlap.   Radiographic progression was noted in the right hand.

## 2021-04-13 NOTE — Progress Notes (Signed)
X-rays of both feet consistent with RA and OA overlap.  No erosive changes noted.

## 2021-04-13 NOTE — Progress Notes (Signed)
Office Visit Note  Patient: Amanda Hammond             Date of Birth: 22-May-1970           MRN: [id number]             PCP: Leeanne Rio, MD Referring: Leeanne Rio, MD Visit Date: 04/13/2021 Occupation: @GUAROCC @  Subjective:  Pain in both hands  History of Present Illness: Amanda Hammond is a 51 y.o. female with history of seronegaitve rheumatoid arthritis, osteoarthritis, and fibromyalgia.  She is taking Arava 20 mg 1 tablet by mouth daily.  She continues to tolerate her Reyvow without any side effects.  She has been out of her prescription for Reyvow for the past 1 week.  She reports that she continues to have pain in multiple joints even while taking Arava on a daily basis.  She has had increased pain and inflammation in both wrist joints, both hands, right knee, and both feet.  She recently had an episode of extensor tenosynovitis in the right wrist.  The pain in her right knee has been severe and she has difficulty walking prolonged distances or standing for prolonged periods of time due to the severity of pain.  The increased pain in her feet has been new over the past couple of months.  She continues to have generalized myalgias and muscle tenderness due to fibromyalgia.  Activities of Daily Living:  Patient reports morning stiffness for 1.5 hours.   Patient Reports nocturnal pain.  Difficulty dressing/grooming: Denies Difficulty climbing stairs: Reports Difficulty getting out of chair: Denies Difficulty using hands for taps, buttons, cutlery, and/or writing: Reports  Review of Systems  Constitutional:  Positive for fatigue.  HENT:  Positive for mouth dryness.   Eyes:  Negative for dryness.  Respiratory:  Negative for shortness of breath.   Cardiovascular:  Positive for swelling in legs/feet.  Gastrointestinal:  Positive for constipation and diarrhea.  Endocrine: Negative for increased urination.  Genitourinary:  Negative for difficulty urinating.   Musculoskeletal:  Positive for joint pain, joint pain, joint swelling, muscle weakness, morning stiffness and muscle tenderness.  Skin:  Negative for rash.  Allergic/Immunologic: Negative for susceptible to infections.  Neurological:  Positive for numbness and weakness.  Hematological:  Negative for bruising/bleeding tendency.  Psychiatric/Behavioral:  Positive for sleep disturbance.    PMFS History:  Patient Active Problem List   Diagnosis Date Noted   Rheumatoid factor positive 11/06/2016   Trochanteric bursitis of both hips 11/06/2016   History of fibromyalgia 11/06/2016   DDD (degenerative disc disease), cervical 11/06/2016   Carpal tunnel syndrome, left upper limb 11/06/2016   Carpal tunnel syndrome, right upper limb 11/06/2016   Trigger finger, left middle finger 11/06/2016   Major depressive disorder, recurrent episode, moderate ([place]) 04/27/2016   Pain in left hand 04/25/2016   Pain in right hand 04/25/2016   Primary osteoarthritis of both hands 04/25/2016   Primary osteoarthritis of both knees 04/25/2016   Primary osteoarthritis of both feet 04/25/2016   Fibromyalgia 02/17/2016   Other insomnia 02/17/2016   Fatigue 02/17/2016   B12 DEFICIENCY 02/01/2010   Vitamin D deficiency 02/01/2010   ANEMIA, IRON DEFICIENCY 12/30/2009   OTHER DYSPNEA AND RESPIRATORY ABNORMALITIES 12/30/2009   Nonspecific (abnormal) findings on radiological and other examination of body structure 12/30/2009   COMPUTERIZED TOMOGRAPHY, CHEST, ABNORMAL 12/30/2009    Past Medical History:  Diagnosis Date   Anemia    Fibromyalgia    Hypertension  Osteoarthritis    PONV (postoperative nausea and vomiting)    Rheumatoid arthritis ([place])    Vitamin D deficiency     Family History  Problem Relation Age of Onset   Diabetes Mother    Hypertension Mother    Hypertension Father    Hypertension Brother    Osteoarthritis Brother    Hypertension Brother    Osteoarthritis Brother    Endometriosis  Daughter    Anesthesia problems Neg Hx    Hypotension Neg Hx    Pseudochol deficiency Neg Hx    Malignant hyperthermia Neg Hx    Past Surgical History:  Procedure Laterality Date   APPENDECTOMY  2010   [place]   CARPAL TUNNEL RELEASE Left    CHOLECYSTECTOMY  1999   [place], [place]   GASTRIC BYPASS  2003   [facility]   HERNIA REPAIR  08/2010, 02/2010    incisional, [facility], [place]   INCISIONAL HERNIA REPAIR  10/12/2010   Procedure: HERNIA REPAIR INCISIONAL;  Surgeon: Jamesetta So;  Location: [facility];  Service: General;  Laterality: N/A;  Recurrent Incisional Hernia Repair with Mesh   KNEE SURGERY Right 01/2018   meniscal tear    KNEE SURGERY Right 12/2019   PANNICULECTOMY  2008   [place] Left    TENNIS ELBOW RELEASE/NIRSCHEL PROCEDURE Bilateral    Social History   Social History Narrative   Not on file    There is no immunization history on file for this patient.   Objective: Vital Signs: BP (!) 90/50 (BP Location: Left Arm, Patient Position: Sitting, Cuff Size: Normal)    Pulse 93    Resp 16    Ht 5\' 4"  (1.626 m)    Wt 244 lb (110.7 kg)    BMI 41.88 kg/m    Physical Exam Vitals and nursing note reviewed.  Constitutional:      Appearance: She is well-developed.  HENT:     Head: Normocephalic and atraumatic.  Eyes:     Conjunctiva/sclera: Conjunctivae normal.  Pulmonary:     Effort: Pulmonary effort is normal.  Abdominal:     Palpations: Abdomen is soft.  Musculoskeletal:     Cervical back: Normal range of motion.  Skin:    General: Skin is warm and dry.     Capillary Refill: Capillary refill takes less than 2 seconds.  Neurological:     Mental Status: She is alert and oriented to person, place, and time.  Psychiatric:        Behavior: Behavior normal.     Musculoskeletal Exam: Generalized hyperalgesia and positive tender points on examination.  C-spine has limited range of motion with discomfort.  Trapezius muscle tension and tenderness bilaterally.  Painful  range of motion of both shoulder joints.  Some tenderness over the right elbow joint line.  Tenderness and synovitis of the right wrist noted.  She has tenderness and synovitis of bilateral second and third MCP and PIP joints.  Warmth and swelling of the right knee noted.  Limited extension of the right knee joint.  Tenderness over both ankle joints with warmth in the right ankle.  Tenderness over MTP joints.  CDAI Exam: CDAI Score: 29.7  Patient Global: 9 mm; Provider Global: 8 mm Swollen: 11 ; Tender: 19  Joint Exam 04/13/2021      Right  Left  Glenohumeral   Tender   Tender  Elbow   Tender     Wrist  Swollen Tender     MCP 1  Swollen Tender   Tender  MCP 2  Swollen Tender  Swollen Tender  MCP 3  Swollen Tender  Swollen Tender  IP   Tender   Tender  PIP 2  Swollen Tender  Swollen Tender  PIP 3  Swollen Tender  Swollen Tender  Knee  Swollen Tender     Ankle   Tender   Tender     Investigation: No additional findings.  Imaging: No results found.  Recent Labs: Lab Results  Component Value Date   WBC 7.9 12/01/2020   HGB 10.2 (L) 12/01/2020   PLT 373 12/01/2020   NA 135 12/01/2020   K 4.0 12/01/2020   CL 101 12/01/2020   CO2 25 12/01/2020   GLUCOSE 84 12/01/2020   BUN 12 12/01/2020   CREATININE 1.06 (H) 12/01/2020   BILITOT 0.4 12/01/2020   ALKPHOS 84 12/05/2009   AST 14 12/01/2020   ALT 13 12/01/2020   PROT 6.3 12/01/2020   ALBUMIN 3.1 (L) 12/05/2009   CALCIUM 8.5 (L) 12/01/2020   GFRAA 67 09/02/2020   QFTBGOLDPLUS NEGATIVE 10/27/2019    Speciality Comments: No specialty comments available.  Procedures:  No procedures performed Allergies: Patient has no known allergies.   Assessment / Plan:     Visit Diagnoses: Seronegative rheumatoid arthritis (HCC) -She presents today with joint tenderness and synovitis involving multiple joints.  Her pain and inflammation has been most severe in the right wrist, both hands, right knee, and both feet.  She had an episode  of extensor tenosynovitis of the right wrist last week which has since resolved.  She had tenderness and synovitis of several MCP and PIP joints as described above.  Ongoing warmth and swelling as well as limited extension of the right knee was noted.  She has had new onset pain in both feet over the past couple of months.  She is currently taking Arava 20 mg 1 tablet by mouth daily.  She has been compliant taking her arava as prescribed but has missed the last 1 week due to requiring a refill.  Her rheumatoid arthritis is not well controlled on monotherapy.  Different treatment options were discussed today in detail.  She has family history of multiple sclerosis so she is not a good candidate for TNF inhibitors.  The indications, contraindications, potential side effects of Orencia were discussed.  All questions were addressed and consent was obtained today.  She has had baseline immunosuppressive lab work in the past on 10/27/2019.  TB Gold was updated today along with CBC and CMP.  She will require a baseline chest x-ray prior to starting on Orencia.  X-rays of both hands and feet were also updated today to assess for radiographic progression.  Once Dub Amis has been approved through her insurance she will return to the office for administration of the first injection.  She will follow-up in 6 weeks to assess her response.  Plan: XR Hand 2 View Right, XR Hand 2 View Left, XR Foot 2 Views Left, XR Foot 2 Views Right, DG Chest 2 View  Medication counseling:  TB Gold: Pending  Hepatitis panel:Negative 10/27/19 HIV: Negative 10/27/19  SPEP: 10/27/19 Immunoglobulins: 10/27/19   Does patient have a diagnosis of COPD? No  Counseled patient that Dub Amis is a selective T-cell costimulation blocker indicated for rheumatoid arthritis.  Counseled patient on purpose, proper use, and adverse effects of Orencia. The most common adverse effects are increased risk of infections, headache, and injection site reactions.  There  is the possibility  of an increased risk of malignancy but it is not well understood if this increased risk is due to the medication or the disease state.  Reviewed the importance of regular labs while on Orencia therapy.  Counseled patient that Maureen Chatters should be held prior to scheduled surgery.  Counseled patient to avoid live vaccines while on Orencia.  Advised patient to get annual influenza vaccine and the pneumococcal vaccine as indicated.  Provided patient with medication education material and answered all questions.  Patient consented to [facility].  Will upload consent into patient's chart.  Will apply for Orencia through patient's insurance.  Reviewed storage information for Orencia.  Advised initial injection must be administered in office.    High risk medication use - Applying for orencia 125 mg sq injections once weekly.  She will remain on arava 20 mg 1 tablet by mouth daily.  CBC and CMP were drawn on 12/01/2020.  She is overdue to update lab work.  CBC and CMP were drawn today.  TB Gold was also updated prior to initiating Orencia.  She has had all other baseline immunosuppressive labs necessary prior to starting on Orencia.  She will require a baseline chest x-ray.  A future order was placed today.   She will require updated lab work in 1 month and every 3 months while on Orencia.  Plan: CBC with Differential/Platelet, COMPLETE METABOLIC PANEL WITH GFR, QuantiFERON-TB Gold Plus, DG Chest 2 View Discussed the importance of holding Orencia and Arava if she develops signs or symptoms of infection and to resume once the infection has completely cleared.  Screening for tuberculosis -Order for TB gold released today.  Plan: QuantiFERON-TB Gold Plus  Primary osteoarthritis of both hands: She has PIP and DIP thickening consistent with osteoarthritis of both hands.  She has tenderness and synovitis of several MCP and PIP joints as described above.  Her rheumatoid arthritis is uncontrolled on the current  treatment regimen.  Updated x-rays of both hands were obtained today to assess for radiographic progression.  Discussed the importance of joint protection and muscle strengthening.  Carpal tunnel syndrome, right upper limb: Asymptomatic at this time.  Trochanteric bursitis of both hips: Tenderness over bilateral trochanter bursa noted.  Discussed the importance of performing stretching exercises daily.  Primary osteoarthritis of both knees: Chronic pain in both knees especially the right knee.  She has limited extension, warmth, and swelling in the right knee.  Effusion, right knee: Warmth and swelling in the right knee joint was noted on examination today.  Limited extension noted.  She has difficulty walking or standing for prolonged periods of time due to severity of symptoms.  Primary osteoarthritis of both feet: She is been experiencing increased pain in both feet over the past several months.  She has tenderness over both ankle joints with warmth of the right ankle.  She has tenderness over the MTP joints.  Updated x-rays of both feet were obtained today.  DDD (degenerative disc disease), cervical: C-spine is limited and painful range of motion.  Trapezius muscle tension and tenderness bilaterally.  No symptoms of radiculopathy at this time.  Fibromyalgia: She has generalized hyperalgesia and positive tender points on examination.  She experiences myalgias and muscle tenderness due to underlying fibromyalgia.  She has trapezius muscle tension and tenderness bilaterally.  She continues to follow-up with pain management on a regular basis.  She remains on Lyrica, Cymbalta, tizanidine, Percocet, and recently added tramadol for symptomatic relief.  Other fatigue: Chronic and secondary to insomnia and  fibromyalgia.  Discussed the importance of regular exercise.  Other insomnia: She had difficulty sleeping at night due to nocturnal pain.  Major depressive disorder, recurrent episode, moderate  ([place]): She is taking Wellbutrin and Cymbalta as prescribed.  Vitamin D deficiency    Orders: Orders Placed This Encounter  Procedures   XR Hand 2 View Right   XR Hand 2 View Left   XR Foot 2 Views Left   XR Foot 2 Views Right   DG Chest 2 View   CBC with Differential/Platelet   COMPLETE METABOLIC PANEL WITH GFR   QuantiFERON-TB Gold Plus   Meds ordered this encounter  Medications   predniSONE (DELTASONE) 5 MG tablet    Sig: Take 4 tablets by mouth daily x4 days, 3 tablets daily x4 days, 2 tablets daily x4 days, 1 tablet daily x4 days.    Dispense:  40 tablet    Refill:  0    Follow-Up Instructions: Return in about 6 weeks (around 05/25/2021) for Rheumatoid arthritis, Osteoarthritis.   Ofilia Neas, PA-C  Note - This record has been created using Dragon software.  Chart creation errors have been sought, but may not always  have been located. Such creation errors do not reflect on  the standard of medical care.

## 2021-04-13 NOTE — Patient Instructions (Signed)
Standing Labs We placed an order today for your standing lab work.   Please have your standing labs drawn in 1 month and every 3 months   If possible, please have your labs drawn 2 weeks prior to your appointment so that the provider can discuss your results at your appointment.  Please note that you may see your imaging and lab results in MyChart before we have reviewed them. We may be awaiting multiple results to interpret others before contacting you. Please allow our office up to 72 hours to thoroughly review all of the results before contacting the office for clarification of your results.  We have open lab daily: Monday through Thursday from 1:30-4:30 PM and Friday from 1:30-4:00 PM at the office of Dr. Pollyann Savoy, [facility].   Please be advised, all patients with office appointments requiring lab work will take precedent over walk-in lab work.  If possible, please come for your lab work on Monday and Friday afternoons, as you may experience shorter wait times. The office is located at [address], [place], [place] [address] No appointment is necessary.   Labs are drawn by [facility]. Please bring your co-pay at the time of your lab draw.  You may receive a bill from [facility] for your lab work.  If you wish to have your labs drawn at another location, please call the office 24 hours in advance to send orders.  If you have any questions regarding directions or hours of operation,  please call [phone].   As a reminder, please drink plenty of water prior to coming for your lab work. Thanks!   Abatacept solution for injection (subcutaneous or intravenous use) What is this medication? ABATACEPT (a ba TA sept) is used to treat rheumatoid arthritis, psoriatic arthritis, and juvenile idiopathic arthritis. It prevents acute graft-versus-host disease following a stem-cell transplant. This medicine may be used for other purposes; ask your health care provider  or pharmacist if you have questions. COMMON BRAND NAME(S): Reatha Armour ClickJect What should I tell my care team before I take this medication? They need to know if you have any of these conditions: cancer diabetes hepatitis B or history of hepatitis B infection immune system problems infection or history of infection (especially a virus infection such as chickenpox, cold sores, or herpes) lung or breathing problems, like chronic obstructive pulmonary disease (COPD) recently received or scheduled to receive a vaccination scheduled to have surgery tuberculosis, a positive skin test for tuberculosis, or have recently been in close contact with someone who has tuberculosis an unusual or allergic reaction to abatacept, other medicines, foods, dyes, or preservatives pregnant or trying to get pregnant breast-feeding How should I use this medication? This medicine is for infusion into a vein or for injection under the skin. Infusions are given by a health care professional in a hospital or clinic setting. If you are to give your own medicine at home, you will be taught how to prepare and give this medicine under the skin. Use exactly as directed. Take your medicine at regular intervals. Do not take your medicine more often than directed. It is important that you put your used needles and syringes in a special sharps container. Do not put them in a trash can. If you do not have a sharps container, call your pharmacist or health care provider to get one. Talk to your pediatrician regarding the use of this medicine in children. While infusions in a clinic may be prescribed for children  as young as 2 years for selected conditions, precautions do apply. Overdosage: If you think you have taken too much of this medicine contact a poison control center or emergency room at once. NOTE: This medicine is only for you. Do not share this medicine with others. What if I miss a dose? This medicine is used once  a week if given by injection under the skin. If you miss a dose, take it as soon as you can. If it is almost time for your next dose, take only that dose. Do not take double or extra doses. If you are to be given an infusion of this medicine, it is important not to miss your dose. Doses are usually every 4 weeks. Call your doctor or health care professional if you are unable to keep an appointment. What may interact with this medication? Do not take this medicine with any of the following medications: live vaccines This medicine may also interact with the following medications: anakinra baricitinib canakinumab medicines that lower your chance of fighting an infection rituximab TNF blockers such as adalimumab, certolizumab, etanercept, golimumab, infliximab tocilizumab tofacitinib upadacitinib ustekinumab This list may not describe all possible interactions. Give your health care provider a list of all the medicines, herbs, non-prescription drugs, or dietary supplements you use. Also tell them if you smoke, drink alcohol, or use illegal drugs. Some items may interact with your medicine. What should I watch for while using this medication? Visit your doctor for regular checks on your progress. Tell your doctor or health care professional if your symptoms do not start to get better or if they get worse. You will be tested for tuberculosis (TB) before you start this medicine. If your doctor prescribed any medicine for TB, you should start taking the TB medicine before starting this medicine. Make sure to finish the full course of TB medicine. This medicine may increase your risk of getting an infection. Call your doctor or health care professional if you get fever, chills, or sore throat, or other symptoms of a cold or flu. Do not treat yourself. Try to avoid being around people who are sick. If you have diabetes and are getting this medicine in a vein, the infusion can give false high blood sugar  readings on the day of your dose. This may happen if you use certain types of blood glucose tests. Your health care provider may tell you to use a different way to monitor your blood sugar levels. What side effects may I notice from receiving this medication? Side effects that you should report to your doctor or health care professional as soon as possible: allergic reactions like skin rash, itching or hives, swelling of the face, lips, or tongue breathing problems chest pain dizziness signs and symptoms of infection like fever; chills; cough; sore throat; pain or trouble passing urine unusually weak or tired Side effects that usually do not require medical attention (report to your doctor or health care professional if they continue or are bothersome): diarrhea headache nausea pain, redness, or irritation at site where injected stomach pain or upset This list may not describe all possible side effects. Call your doctor for medical advice about side effects. You may report side effects to FDA at 1-800-FDA-1088. Where should I keep my medication? Infusions will be given in a hospital or clinic and will not be stored at home. Storage for syringes and autoinjectors stored at home: Keep out of the reach of children. Store in a refrigerator between 2 and  8 degrees C (36 and 46 degrees F). Keep this medicine in the original container. Protect from light. Do not freeze. Do not shake. Throw away any unused medicine after the expiration date. NOTE: This sheet is a summary. It may not cover all possible information. If you have questions about this medicine, talk to your doctor, pharmacist, or health care provider.  2022 Elsevier/Gold Standard (2020-04-29 00:00:00)

## 2021-04-14 NOTE — Progress Notes (Signed)
Patient remains anemic but it is improving.   Total protein is borderline low.  Rest of CMP WNL.   Please remind patient to go to [facility] for CXR.  We are applying for orencia.

## 2021-04-15 NOTE — Telephone Encounter (Signed)
Received a fax regarding Prior Authorization from [employer] for [facility]. Authorization has been DENIED due to lack of trial and failure, contraindication, and/or intolerance of formulary alternatives.   Fax placed on Devki's desk for appeal process.  Phone# [phone] Fax# [phone]

## 2021-04-16 LAB — COMPLETE METABOLIC PANEL WITH GFR
AG Ratio: 1.7 (calc) (ref 1.0–2.5)
ALT: 23 U/L (ref 6–29)
AST: 16 U/L (ref 10–35)
Albumin: 3.7 g/dL (ref 3.6–5.1)
Alkaline phosphatase (APISO): 121 U/L (ref 37–153)
BUN: 14 mg/dL (ref 7–25)
CO2: 30 mmol/L (ref 20–32)
Calcium: 8.6 mg/dL (ref 8.6–10.4)
Chloride: 100 mmol/L (ref 98–110)
Creat: 0.93 mg/dL (ref 0.50–1.03)
Globulin: 2.2 g/dL (calc) (ref 1.9–3.7)
Glucose, Bld: 85 mg/dL (ref 65–99)
Potassium: 3.8 mmol/L (ref 3.5–5.3)
Sodium: 135 mmol/L (ref 135–146)
Total Bilirubin: 0.3 mg/dL (ref 0.2–1.2)
Total Protein: 5.9 g/dL — ABNORMAL LOW (ref 6.1–8.1)
eGFR: 75 mL/min/{1.73_m2} (ref >=60)

## 2021-04-16 LAB — QUANTIFERON-TB GOLD PLUS
Mitogen-NIL: >10 IU/mL
NIL: 0.04 IU/mL
QuantiFERON-TB Gold Plus: NEGATIVE
TB1-NIL: 0 IU/mL
TB2-NIL: 0 IU/mL

## 2021-04-16 LAB — CBC WITH DIFFERENTIAL/PLATELET
Absolute Monocytes: 781 cells/uL (ref 200–950)
Basophils Absolute: 56 cells/uL (ref 0–200)
Basophils Relative: 0.6 %
Eosinophils Absolute: 130 cells/uL (ref 15–500)
Eosinophils Relative: 1.4 %
HCT: 34.1 % — ABNORMAL LOW (ref 35.0–45.0)
Hemoglobin: 10.6 g/dL — ABNORMAL LOW (ref 11.7–15.5)
Lymphs Abs: 2827 cells/uL (ref 850–3900)
MCH: 23.2 pg — ABNORMAL LOW (ref 27.0–33.0)
MCHC: 31.1 g/dL — ABNORMAL LOW (ref 32.0–36.0)
MCV: 74.8 fL — ABNORMAL LOW (ref 80.0–100.0)
MPV: 10.6 fL (ref 7.5–12.5)
Monocytes Relative: 8.4 %
Neutro Abs: 5506 cells/uL (ref 1500–7800)
Neutrophils Relative %: 59.2 %
Platelets: 330 10*3/uL (ref 140–400)
RBC: 4.56 10*6/uL (ref 3.80–5.10)
RDW: 16.3 % — ABNORMAL HIGH (ref 11.0–15.0)
Total Lymphocyte: 30.4 %
WBC: 9.3 10*3/uL (ref 3.8–10.8)

## 2021-04-18 NOTE — Progress Notes (Signed)
TB gold negative

## 2021-04-29 ENCOUNTER — Other Ambulatory Visit: Payer: Self-pay | Admitting: *Deleted

## 2021-04-29 MED ORDER — LEFLUNOMIDE 20 MG PO TABS: 20.0000 mg | ORAL_TABLET | Freq: Every day | ORAL | 0 refills | Status: DC

## 2021-04-29 MED ORDER — PREDNISONE 5 MG PO TABS: ORAL_TABLET | ORAL | 0 refills | Status: DC
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
## 2021-04-29 NOTE — Addendum Note (Signed)
Addended by: Carole Binning on: 04/29/2021 09:42 AM   Modules accepted: Orders

## 2021-04-29 NOTE — Telephone Encounter (Signed)
Do we have any update on the approval for orencia?   Ok to refill Arava.   Ok to send in prednisone 20 mg tapering by 5 mg every week.

## 2021-04-29 NOTE — Telephone Encounter (Signed)
Patient states she is having extreme pain in her hands. Patient states she is also having swelling. Patient states she is not currently on any medication as her Ranae Plumber was not refilled at her last appointment. Patient states she is waiting to start Dub Amis which is going through the appeals process with her insurance. Patient states she has finished the prednisone taper that was given on 04/13/2021. Please advise.  Next Visit: 07/14/2021  Last Visit: 04/13/2021  Last Fill: 12/02/2020  DX: Seronegative rheumatoid arthritis   Current Dose per office note 04/13/2021: Arava 20 mg 1 tablet by mouth daily  Labs: 04/13/2021 Patient remains anemic but it is improving.   Total protein is borderline low.  Rest of CMP WNL.    Okay to refill Arava?

## 2021-04-29 NOTE — Telephone Encounter (Signed)
Patient advised Ranae Plumberrava has been sent to the pharmacy and we have also send a prednisone taper.

## 2021-05-03 ENCOUNTER — Other Ambulatory Visit (HOSPITAL_COMMUNITY): Payer: Self-pay

## 2021-05-03 NOTE — Telephone Encounter (Addendum)
Received notification from [facility] regarding a prior authorization for [facility]. Authorization has been APPROVED from 04/03/21 to 04/02/22.   Per test claim, copay for 28 days supply is $596.18. Will have to pursue [employer]MS patient assistance.  Patient did not receive PAP application at office visit. Spoke with patient this morning and would like application mailed to her home. I advised of income document requirement and out-of-pocket rx cost requirement as well. Urged her to call our clinic once she submits her portion of application to BMS if she does not plan to return to clinic via mail or in-person. She verbalized understanding to above.  Provider portion placed in Sherron Ales, PA-C's, folder to be signed.  Chesley Mires, PharmD, MPH, BCPS Clinical Pharmacist (Rheumatology and Pulmonology)

## 2021-06-01 NOTE — Telephone Encounter (Signed)
Called patient to determine status of her portion of [place] [employer]MS PAP application. Unable to reach - VM box is full and cannot accept new message. Sent MyChart message to patient ? ?Knox Saliva, PharmD, MPH, BCPS ?Clinical Pharmacist (Rheumatology and Pulmonology) ?

## 2021-06-08 NOTE — Telephone Encounter (Signed)
I called patient regarding [place] [employer]MS PAP application. Patient states she has application completed with income documents and will plan to mail to clinic tomorrow. She had COVID and was symptomatic and didn't have energy to complete during the past few weeks. I advised that we will submit application once received and will reach out to her with updates as we receive them ? ?Chesley Miresevki Aleiah Mohammed, PharmD, MPH, BCPS ?Clinical Pharmacist (Rheumatology and Pulmonology) ?

## 2021-06-30 NOTE — Telephone Encounter (Signed)
Called patient regarding status of [place] [employer] PAP application. Unable to reach - left VM requesting return call. ? ?Chesley Mires, PharmD, MPH, BCPS ?Clinical Pharmacist (Rheumatology and Pulmonology) ?

## 2021-06-30 NOTE — Progress Notes (Deleted)
? ?Office Visit Note ? ?Patient: Amanda Hammond             ?Date of Birth: 09-20-1970           ?MRN: [id number]             ?PCP: Suzan Slick, MD ?Referring: Suzan Slick, MD ?Visit Date: 07/14/2021 ?Occupation: @GUAROCC @ ? ?Subjective:  ?No chief complaint on file. ? ? ?History of Present Illness: Amanda Hammond is a 51 y.o. female ***  ? ?Activities of Daily Living:  ?Patient reports morning stiffness for *** {minute/hour:19697}.   ?Patient {ACTIONS;DENIES/REPORTS:21021675::"Denies"} nocturnal pain.  ?Difficulty dressing/grooming: {ACTIONS;DENIES/REPORTS:21021675::"Denies"} ?Difficulty climbing stairs: {ACTIONS;DENIES/REPORTS:21021675::"Denies"} ?Difficulty getting out of chair: {ACTIONS;DENIES/REPORTS:21021675::"Denies"} ?Difficulty using hands for taps, buttons, cutlery, and/or writing: {ACTIONS;DENIES/REPORTS:21021675::"Denies"} ? ?No Rheumatology ROS completed.  ? ?PMFS History:  ?Patient Active Problem List  ? Diagnosis Date Noted  ?? Rheumatoid factor positive 11/06/2016  ?? Trochanteric bursitis of both hips 11/06/2016  ?? History of fibromyalgia 11/06/2016  ?? DDD (degenerative disc disease), cervical 11/06/2016  ?? Carpal tunnel syndrome, left upper limb 11/06/2016  ?? Carpal tunnel syndrome, right upper limb 11/06/2016  ?? Trigger finger, left middle finger 11/06/2016  ?? Major depressive disorder, recurrent episode, moderate (HCC) 04/27/2016  ?? Pain in left hand 04/25/2016  ?? Pain in right hand 04/25/2016  ?? Primary osteoarthritis of both hands 04/25/2016  ?? Primary osteoarthritis of both knees 04/25/2016  ?? Primary osteoarthritis of both feet 04/25/2016  ?? Fibromyalgia 02/17/2016  ?? Other insomnia 02/17/2016  ?? Fatigue 02/17/2016  ?? B12 DEFICIENCY 02/01/2010  ?? Vitamin D deficiency 02/01/2010  ?? ANEMIA, IRON DEFICIENCY 12/30/2009  ?? OTHER DYSPNEA AND RESPIRATORY ABNORMALITIES 12/30/2009  ?? Nonspecific (abnormal) findings on radiological and other examination of body  structure 12/30/2009  ?? COMPUTERIZED TOMOGRAPHY, CHEST, ABNORMAL 12/30/2009  ?  ?Past Medical History:  ?Diagnosis Date  ?? Anemia   ?? Fibromyalgia   ?? Hypertension   ?? Osteoarthritis   ?? PONV (postoperative nausea and vomiting)   ?? Rheumatoid arthritis (HCC)   ?? Vitamin D deficiency   ?  ?Family History  ?Problem Relation Age of Onset  ?? Diabetes Mother   ?? Hypertension Mother   ?? Hypertension Father   ?? Hypertension Brother   ?? Osteoarthritis Brother   ?? Hypertension Brother   ?? Osteoarthritis Brother   ?? Endometriosis Daughter   ?? Anesthesia problems Neg Hx   ?? Hypotension Neg Hx   ?? Pseudochol deficiency Neg Hx   ?? Malignant hyperthermia Neg Hx   ? ?Past Surgical History:  ?Procedure Laterality Date  ?? APPENDECTOMY  2010  ? [facility]  ?? CARPAL TUNNEL RELEASE Left   ?? CHOLECYSTECTOMY  1999  ? lap, [facility]  ?? GASTRIC BYPASS  2003  ? [facility]  ?? HERNIA REPAIR  08/2010, 02/2010  ?  incisional, [facility], [facility]  ?? INCISIONAL HERNIA REPAIR  10/12/2010  ? Procedure: HERNIA REPAIR INCISIONAL;  Surgeon: Dalia Heading;  Location: [facility];  Service: General;  Laterality: N/A;  Recurrent Incisional Hernia Repair with Mesh  ?? KNEE SURGERY Right 01/2018  ? meniscal tear   ?? KNEE SURGERY Right 12/2019  ?? PANNICULECTOMY  2008  ? [facility]  ?? SHOULDER SURGERY Left   ?? TENNIS ELBOW RELEASE/NIRSCHEL PROCEDURE Bilateral   ? ?Social History  ? ?Social History Narrative  ?? Not on file  ? ? ?There is no immunization history on file for this patient.  ? ?Objective: ?Vital Signs: There were no vitals  taken for this visit.  ? ?Physical Exam  ? ?Musculoskeletal Exam: *** ? ?CDAI Exam: ?CDAI Score: -- ?Patient Global: --; Provider Global: -- ?Swollen: --; Tender: -- ?Joint Exam 07/14/2021  ? ?No joint exam has been documented for this visit  ? ?There is currently no information documented on the homunculus. Go to the Rheumatology activity and complete the homunculus joint exam. ? ?Investigation: ?No additional  findings. ? ?Imaging: ?No results found. ? ?Recent Labs: ?Lab Results  ?Component Value Date  ? WBC 9.3 04/13/2021  ? HGB 10.6 (L) 04/13/2021  ? PLT 330 04/13/2021  ? NA 135 04/13/2021  ? K 3.8 04/13/2021  ? CL 100 04/13/2021  ? CO2 30 04/13/2021  ? GLUCOSE 85 04/13/2021  ? BUN 14 04/13/2021  ? CREATININE 0.93 04/13/2021  ? BILITOT 0.3 04/13/2021  ? ALKPHOS 84 12/05/2009  ? AST 16 04/13/2021  ? ALT 23 04/13/2021  ? PROT 5.9 (L) 04/13/2021  ? ALBUMIN 3.1 (L) 12/05/2009  ? CALCIUM 8.6 04/13/2021  ? GFRAA 67 09/02/2020  ? QFTBGOLDPLUS NEGATIVE 04/13/2021  ? ? ?Speciality Comments: No specialty comments available. ? ?Procedures:  ?No procedures performed ?Allergies: Patient has no known allergies.  ? ?Assessment / Plan:     ?Visit Diagnoses: Seronegative rheumatoid arthritis (HCC) ? ?High risk medication use ? ?Primary osteoarthritis of both hands ? ?Carpal tunnel syndrome, right upper limb ? ?Trochanteric bursitis of both hips ? ?Primary osteoarthritis of both knees ? ?Effusion, right knee ? ?Primary osteoarthritis of both feet ? ?DDD (degenerative disc disease), cervical ? ?Fibromyalgia ? ?Other fatigue ? ?Other insomnia ? ?Major depressive disorder, recurrent episode, moderate (HCC) ? ?Vitamin D deficiency ? ?Trapezius muscle spasm ? ?Orders: ?No orders of the defined types were placed in this encounter. ? ?No orders of the defined types were placed in this encounter. ? ? ?Face-to-face time spent with patient was *** minutes. Greater than 50% of time was spent in counseling and coordination of care. ? ?Follow-Up Instructions: No follow-ups on file. ? ? ?Gearldine Bienenstockaylor M Kuba Shepherd, PA-C ? ?Note - This record has been created using [employer] software.  ?Chart creation errors have been sought, but may not always  ?have been located. Such creation errors do not reflect on  ?the standard of medical care. ? ?

## 2021-07-06 NOTE — Telephone Encounter (Signed)
Called patient regarding status of [place] [employer]MS PAP application. She states she will be mailing application with income documents back to our clinic today (name on application will need to be fixed). She states that she'd like to keep her OV with Dr. Estanislado Pandy on 07/14/21 as she is in pain (likely flaring since she has not started [place]) ?  ?Knox Saliva, PharmD, MPH, BCPS ?Clinical Pharmacist (Rheumatology and Pulmonology) ?

## 2021-07-12 NOTE — Telephone Encounter (Signed)
Patient portion of [place] BMS pt assistance application received. ? ?Submitted Patient Assistance Application to BMS for [facility] along with patient and provider portion, PA, med list and income documents. Will update patient when we receive a response. ? ?Fax# [phone] ?Phone# [phone] ? ?Knox Saliva, PharmD, MPH, BCPS ?Clinical Pharmacist (Rheumatology and Pulmonology) ? ?

## 2021-07-14 ENCOUNTER — Ambulatory Visit: Payer: Medicare HMO | Admitting: Rheumatology

## 2021-07-14 DIAGNOSIS — E559 Vitamin D deficiency, unspecified: Secondary | ICD-10-CM

## 2021-07-14 DIAGNOSIS — M19071 Primary osteoarthritis, right ankle and foot: Secondary | ICD-10-CM

## 2021-07-14 DIAGNOSIS — M25461 Effusion, right knee: Secondary | ICD-10-CM

## 2021-07-14 DIAGNOSIS — R5383 Other fatigue: Secondary | ICD-10-CM

## 2021-07-14 DIAGNOSIS — M0600 Rheumatoid arthritis without rheumatoid factor, unspecified site: Secondary | ICD-10-CM

## 2021-07-14 DIAGNOSIS — G4709 Other insomnia: Secondary | ICD-10-CM

## 2021-07-14 DIAGNOSIS — M170 Bilateral primary osteoarthritis of knee: Secondary | ICD-10-CM

## 2021-07-14 DIAGNOSIS — M7061 Trochanteric bursitis, right hip: Secondary | ICD-10-CM

## 2021-07-14 DIAGNOSIS — Z79899 Other long term (current) drug therapy: Secondary | ICD-10-CM

## 2021-07-14 DIAGNOSIS — M797 Fibromyalgia: Secondary | ICD-10-CM

## 2021-07-14 DIAGNOSIS — F331 Major depressive disorder, recurrent, moderate: Secondary | ICD-10-CM

## 2021-07-14 DIAGNOSIS — M62838 Other muscle spasm: Secondary | ICD-10-CM

## 2021-07-14 DIAGNOSIS — M19041 Primary osteoarthritis, right hand: Secondary | ICD-10-CM

## 2021-07-14 DIAGNOSIS — M5030 Other cervical disc degeneration, unspecified cervical region: Secondary | ICD-10-CM

## 2021-07-14 DIAGNOSIS — G5601 Carpal tunnel syndrome, right upper limb: Secondary | ICD-10-CM

## 2021-07-15 NOTE — Progress Notes (Deleted)
Office Visit Note  Patient: Amanda BrowConnie G Probasco             Date of Birth: 19-Dec-1970           MRN: [id number]             PCP: Suzan Slickucker, Alethea Y, MD Referring: Suzan Slickucker, Alethea Y, MD Visit Date: 07/20/2021 Occupation: @GUAROCC @  Subjective:  No chief complaint on file.   History of Present Illness: Amanda Hammond is a 51 y.o. female ***   Activities of Daily Living:  Patient reports morning stiffness for *** {minute/hour:19697}.   Patient {ACTIONS;DENIES/REPORTS:21021675::"Denies"} nocturnal pain.  Difficulty dressing/grooming: {ACTIONS;DENIES/REPORTS:21021675::"Denies"} Difficulty climbing stairs: {ACTIONS;DENIES/REPORTS:21021675::"Denies"} Difficulty getting out of chair: {ACTIONS;DENIES/REPORTS:21021675::"Denies"} Difficulty using hands for taps, buttons, cutlery, and/or writing: {ACTIONS;DENIES/REPORTS:21021675::"Denies"}  No Rheumatology ROS completed.   PMFS History:  Patient Active Problem List   Diagnosis Date Noted   Rheumatoid factor positive 11/06/2016   Trochanteric bursitis of both hips 11/06/2016   History of fibromyalgia 11/06/2016   DDD (degenerative disc disease), cervical 11/06/2016   Carpal tunnel syndrome, left upper limb 11/06/2016   Carpal tunnel syndrome, right upper limb 11/06/2016   Trigger finger, left middle finger 11/06/2016   Major depressive disorder, recurrent episode, moderate (HCC) 04/27/2016   Pain in left hand 04/25/2016   Pain in right hand 04/25/2016   Primary osteoarthritis of both hands 04/25/2016   Primary osteoarthritis of both knees 04/25/2016   Primary osteoarthritis of both feet 04/25/2016   Fibromyalgia 02/17/2016   Other insomnia 02/17/2016   Fatigue 02/17/2016   B12 DEFICIENCY 02/01/2010   Vitamin D deficiency 02/01/2010   ANEMIA, IRON DEFICIENCY 12/30/2009   OTHER DYSPNEA AND RESPIRATORY ABNORMALITIES 12/30/2009   Nonspecific (abnormal) findings on radiological and other examination of body structure 12/30/2009    COMPUTERIZED TOMOGRAPHY, CHEST, ABNORMAL 12/30/2009    Past Medical History:  Diagnosis Date   Anemia    Fibromyalgia    Hypertension    Osteoarthritis    PONV (postoperative nausea and vomiting)    Rheumatoid arthritis (HCC)    Vitamin D deficiency     Family History  Problem Relation Age of Onset   Diabetes Mother    Hypertension Mother    Hypertension Father    Hypertension Brother    Osteoarthritis Brother    Hypertension Brother    Osteoarthritis Brother    Endometriosis Daughter    Anesthesia problems Neg Hx    Hypotension Neg Hx    Pseudochol deficiency Neg Hx    Malignant hyperthermia Neg Hx    Past Surgical History:  Procedure Laterality Date   APPENDECTOMY  2010   [facility]   CARPAL TUNNEL RELEASE Left    CHOLECYSTECTOMY  1999   lap, [facility]   GASTRIC BYPASS  2003   [facility]   HERNIA REPAIR  08/2010, 02/2010    incisional, [facility], [facility]   INCISIONAL HERNIA REPAIR  10/12/2010   Procedure: HERNIA REPAIR INCISIONAL;  Surgeon: Dalia HeadingMark A Jenkins;  Location: [facility];  Service: General;  Laterality: N/A;  Recurrent Incisional Hernia Repair with Mesh   KNEE SURGERY Right 01/2018   meniscal tear    KNEE SURGERY Right 12/2019   PANNICULECTOMY  2008   [facility]   SHOULDER SURGERY Left    TENNIS ELBOW RELEASE/NIRSCHEL PROCEDURE Bilateral    Social History   Social History Narrative   Not on file    There is no immunization history on file for this patient.   Objective: Vital Signs: There were no vitals  taken for this visit.   Physical Exam   Musculoskeletal Exam: ***  CDAI Exam: CDAI Score: -- Patient Global: --; Provider Global: -- Swollen: --; Tender: -- Joint Exam 07/20/2021   No joint exam has been documented for this visit   There is currently no information documented on the homunculus. Go to the Rheumatology activity and complete the homunculus joint exam.  Investigation: No additional findings.  Imaging: No results found.  Recent Labs: Lab Results   Component Value Date   WBC 9.3 04/13/2021   HGB 10.6 (L) 04/13/2021   PLT 330 04/13/2021   NA 135 04/13/2021   K 3.8 04/13/2021   CL 100 04/13/2021   CO2 30 04/13/2021   GLUCOSE 85 04/13/2021   BUN 14 04/13/2021   CREATININE 0.93 04/13/2021   BILITOT 0.3 04/13/2021   ALKPHOS 84 12/05/2009   AST 16 04/13/2021   ALT 23 04/13/2021   PROT 5.9 (L) 04/13/2021   ALBUMIN 3.1 (L) 12/05/2009   CALCIUM 8.6 04/13/2021   GFRAA 67 09/02/2020   QFTBGOLDPLUS NEGATIVE 04/13/2021    Speciality Comments: No specialty comments available.  Procedures:  No procedures performed Allergies: Patient has no known allergies.   Assessment / Plan:     Visit Diagnoses: No diagnosis found.  Orders: No orders of the defined types were placed in this encounter.  No orders of the defined types were placed in this encounter.   Face-to-face time spent with patient was *** minutes. Greater than 50% of time was spent in counseling and coordination of care.  Follow-Up Instructions: No follow-ups on file.   Ellen Henri, CMA  Note - This record has been created using Animal nutritionist.  Chart creation errors have been sought, but may not always  have been located. Such creation errors do not reflect on  the standard of medical care.

## 2021-07-18 NOTE — Telephone Encounter (Signed)
Received a fax from  [facility] regarding [facility] 07[facility]14/[facility]21 to 04/02/2022. Approval letter sent to scan center. ? ?Per letter, BMS will contact the office 8-14 days prior to pt's next refill date to confirm pt's continued need of medication. ? ?Phone number: [phone] ?

## 2021-07-20 ENCOUNTER — Ambulatory Visit: Payer: Medicare HMO | Admitting: Rheumatology

## 2021-07-20 DIAGNOSIS — M170 Bilateral primary osteoarthritis of knee: Secondary | ICD-10-CM

## 2021-07-20 DIAGNOSIS — M5030 Other cervical disc degeneration, unspecified cervical region: Secondary | ICD-10-CM

## 2021-07-20 DIAGNOSIS — G5601 Carpal tunnel syndrome, right upper limb: Secondary | ICD-10-CM

## 2021-07-20 DIAGNOSIS — M0600 Rheumatoid arthritis without rheumatoid factor, unspecified site: Secondary | ICD-10-CM

## 2021-07-20 DIAGNOSIS — M7061 Trochanteric bursitis, right hip: Secondary | ICD-10-CM

## 2021-07-20 DIAGNOSIS — M19071 Primary osteoarthritis, right ankle and foot: Secondary | ICD-10-CM

## 2021-07-20 DIAGNOSIS — R5383 Other fatigue: Secondary | ICD-10-CM

## 2021-07-20 DIAGNOSIS — G4709 Other insomnia: Secondary | ICD-10-CM

## 2021-07-20 DIAGNOSIS — F331 Major depressive disorder, recurrent, moderate: Secondary | ICD-10-CM

## 2021-07-20 DIAGNOSIS — M25461 Effusion, right knee: Secondary | ICD-10-CM

## 2021-07-20 DIAGNOSIS — M797 Fibromyalgia: Secondary | ICD-10-CM

## 2021-07-20 DIAGNOSIS — M19041 Primary osteoarthritis, right hand: Secondary | ICD-10-CM

## 2021-07-20 DIAGNOSIS — E559 Vitamin D deficiency, unspecified: Secondary | ICD-10-CM

## 2021-07-20 DIAGNOSIS — Z79899 Other long term (current) drug therapy: Secondary | ICD-10-CM

## 2021-07-21 NOTE — Telephone Encounter (Signed)
Called patient regarding [place] PAP approval. She states she received shipment of medication on 07/19/21 and has placed in refrigerator. She also states she is currently sick. She will reach back out to schedule O[facility]cia new start visit once her symptoms improve. ? ?Chesley Mires, PharmD, MPH, BCPS ?Clinical Pharmacist (Rheumatology and Pulmonology) ?

## 2021-08-05 NOTE — Telephone Encounter (Signed)
Called patient for update on if she recovered from infection so we can get her started on [place]cia. Unable to reach - phone went straight to VM ?Left VM requesting return call for update ? ?Chesley Miresevki Prabhjot Maddux, PharmD, MPH, BCPS, CPP ?Clinical Pharmacist (Rheumatology and Pulmonology) ?

## 2021-08-10 ENCOUNTER — Ambulatory Visit: Payer: Medicare HMO | Admitting: Physician Assistant

## 2021-08-10 ENCOUNTER — Encounter: Payer: Self-pay | Admitting: Physician Assistant

## 2021-08-10 VITALS — BP 118/80 | HR 103 | Ht 64.0 in | Wt 232.2 lb

## 2021-08-10 DIAGNOSIS — F331 Major depressive disorder, recurrent, moderate: Secondary | ICD-10-CM

## 2021-08-10 DIAGNOSIS — M170 Bilateral primary osteoarthritis of knee: Secondary | ICD-10-CM

## 2021-08-10 DIAGNOSIS — M19071 Primary osteoarthritis, right ankle and foot: Secondary | ICD-10-CM

## 2021-08-10 DIAGNOSIS — M797 Fibromyalgia: Secondary | ICD-10-CM

## 2021-08-10 DIAGNOSIS — Z79899 Other long term (current) drug therapy: Secondary | ICD-10-CM | POA: Diagnosis not present

## 2021-08-10 DIAGNOSIS — M19041 Primary osteoarthritis, right hand: Secondary | ICD-10-CM

## 2021-08-10 DIAGNOSIS — R5383 Other fatigue: Secondary | ICD-10-CM

## 2021-08-10 DIAGNOSIS — M19042 Primary osteoarthritis, left hand: Secondary | ICD-10-CM

## 2021-08-10 DIAGNOSIS — E559 Vitamin D deficiency, unspecified: Secondary | ICD-10-CM

## 2021-08-10 DIAGNOSIS — M0600 Rheumatoid arthritis without rheumatoid factor, unspecified site: Secondary | ICD-10-CM

## 2021-08-10 DIAGNOSIS — M7062 Trochanteric bursitis, left hip: Secondary | ICD-10-CM

## 2021-08-10 DIAGNOSIS — M7061 Trochanteric bursitis, right hip: Secondary | ICD-10-CM

## 2021-08-10 DIAGNOSIS — G4709 Other insomnia: Secondary | ICD-10-CM

## 2021-08-10 DIAGNOSIS — M5030 Other cervical disc degeneration, unspecified cervical region: Secondary | ICD-10-CM

## 2021-08-10 DIAGNOSIS — M25461 Effusion, right knee: Secondary | ICD-10-CM

## 2021-08-10 DIAGNOSIS — M19072 Primary osteoarthritis, left ankle and foot: Secondary | ICD-10-CM

## 2021-08-10 DIAGNOSIS — G5601 Carpal tunnel syndrome, right upper limb: Secondary | ICD-10-CM

## 2021-08-10 MED ORDER — LEFLUNOMIDE 20 MG PO TABS: 20.0000 mg | ORAL_TABLET | Freq: Every day | ORAL | 0 refills | Status: DC

## 2021-08-10 NOTE — Telephone Encounter (Signed)
Patient scheduled to start Orencia tomorrow, 08/11/21 after updating CBC, CMP today ? ?Chesley Mires, PharmD, MPH, BCPS, CPP ?Clinical Pharmacist (Rheumatology and Pulmonology) ?

## 2021-08-10 NOTE — Patient Instructions (Signed)
Standing Labs We placed an order today for your standing lab work.   Please have your standing labs drawn in 1 month and every 3 months    If possible, please have your labs drawn 2 weeks prior to your appointment so that the provider can discuss your results at your appointment.  Please note that you may see your imaging and lab results in MyChart before we have reviewed them. We may be awaiting multiple results to interpret others before contacting you. Please allow our office up to 72 hours to thoroughly review all of the results before contacting the office for clarification of your results.  We have open lab daily: Monday through Thursday from 1:30-4:30 PM and Friday from 1:30-4:00 PM at the office of Dr. Shaili Deveshwar, [facility].   Please be advised, all patients with office appointments requiring lab work will take precedent over walk-in lab work.  If possible, please come for your lab work on Monday and Friday afternoons, as you may experience shorter wait times. The office is located at [address], [address], [place], [place] [address] No appointment is necessary.   Labs are drawn by [facility]. Please bring your co-pay at the time of your lab draw.  You may receive a bill from [facility] for your lab work.  Please note if you are on Hydroxychloroquine and and an order has been placed for a Hydroxychloroquine level, you will need to have it drawn 4 hours or more after your last dose.  If you wish to have your labs drawn at another location, please call the office 24 hours in advance to send orders.  If you have any questions regarding directions or hours of operation,  please call [phone].   As a reminder, please drink plenty of water prior to coming for your lab work. Thanks!  

## 2021-08-10 NOTE — Progress Notes (Signed)
? ?Office Visit Note ? ?Patient: Amanda Hammond             ?Date of Birth: 12/22/1970           ?MRN: [id number]             ?PCP: Suzan Slick, MD ?Referring: Suzan Slick, MD ?Visit Date: 08/10/2021 ?Occupation: @ ? ?Subjective:  ?Discussed initiating Orencia ? ?History of Present Illness: Amanda Hammond is a 51 y.o. female with history of seronegative rheumatoid arthritis, osteoarthritis, and fibromyalgia.  The patient remains on Arava 20 mg 1 tablet daily.  The plan is to initiate Orencia once she has had updated lab work at her appointment today.  She continues to have recurrent flares involving multiple joints.  She states the pain is most severe in both hands and right knee.  She continues to have persistent swelling in both wrists, both hands, and the right knee.  She experiences intermittent swelling in her feet as well.  She has also been having recurrent fibromyalgia flares.  She continues to take tramadol for pain relief and remains on Lyrica and Cymbalta as prescribed. ? ? ? ? ?Activities of Daily Living:  ?Patient reports morning stiffness for 2 hours.   ?Patient Reports nocturnal pain.  ?Difficulty dressing/grooming: Reports ?Difficulty climbing stairs: Reports ?Difficulty getting out of chair: Reports ?Difficulty using hands for taps, buttons, cutlery, and/or writing: Reports ? ?Review of Systems  ?Constitutional:  Positive for fatigue.  ?HENT:  Positive for mouth dryness and nose dryness. Negative for mouth sores.   ?Eyes:  Negative for pain, itching and dryness.  ?Respiratory:  Positive for shortness of breath. Negative for difficulty breathing.   ?Cardiovascular:  Negative for chest pain and palpitations.  ?Gastrointestinal:  Positive for constipation and diarrhea. Negative for blood in stool.  ?Endocrine: Negative for increased urination.  ?Genitourinary:  Negative for difficulty urinating.  ?Musculoskeletal:  Positive for joint pain, joint pain, joint swelling, myalgias,  morning stiffness, muscle tenderness and myalgias.  ?Skin:  Negative for color change, rash and redness.  ?Allergic/Immunologic: Negative for susceptible to infections.  ?Neurological:  Positive for dizziness, numbness, headaches and weakness. Negative for memory loss.  ?Hematological:  Negative for bruising/bleeding tendency.  ?Psychiatric/Behavioral:  Negative for confusion.   ? ?PMFS History:  ?Patient Active Problem List  ? Diagnosis Date Noted  ? Rheumatoid factor positive 11/06/2016  ? Trochanteric bursitis of both hips 11/06/2016  ? History of fibromyalgia 11/06/2016  ? DDD (degenerative disc disease), cervical 11/06/2016  ? Carpal tunnel syndrome, left upper limb 11/06/2016  ? Carpal tunnel syndrome, right upper limb 11/06/2016  ? Trigger finger, left middle finger 11/06/2016  ? Major depressive disorder, recurrent episode, moderate (HCC) 04/27/2016  ? Pain in left hand 04/25/2016  ? Pain in right hand 04/25/2016  ? Primary osteoarthritis of both hands 04/25/2016  ? Primary osteoarthritis of both knees 04/25/2016  ? Primary osteoarthritis of both feet 04/25/2016  ? Fibromyalgia 02/17/2016  ? Other insomnia 02/17/2016  ? Fatigue 02/17/2016  ? B12 DEFICIENCY 02/01/2010  ? Vitamin D deficiency 02/01/2010  ? ANEMIA, IRON DEFICIENCY 12/30/2009  ? OTHER DYSPNEA AND RESPIRATORY ABNORMALITIES 12/30/2009  ? Nonspecific (abnormal) findings on radiological and other examination of body structure 12/30/2009  ? COMPUTERIZED TOMOGRAPHY, CHEST, ABNORMAL 12/30/2009  ?  ?Past Medical History:  ?Diagnosis Date  ? Anemia   ? Fibromyalgia   ? Hypertension   ? Osteoarthritis   ? PONV (postoperative nausea and vomiting)   ? Rheumatoid  arthritis (HCC)   ? Vitamin D deficiency   ?  ?Family History  ?Problem Relation Age of Onset  ? Diabetes Mother   ? Hypertension Mother   ? Hypertension Father   ? Hypertension Brother   ? Osteoarthritis Brother   ? Hypertension Brother   ? Osteoarthritis Brother   ? Endometriosis Daughter   ?  Anesthesia problems Neg Hx   ? Hypotension Neg Hx   ? Pseudochol deficiency Neg Hx   ? Malignant hyperthermia Neg Hx   ? ?Past Surgical History:  ?Procedure Laterality Date  ? APPENDECTOMY  2010  ? [facility]  ? CARPAL TUNNEL RELEASE Left   ? CHOLECYSTECTOMY  1999  ? lap, [facility]  ? GASTRIC BYPASS  2003  ? [facility]  ? HERNIA REPAIR  08/2010, 02/2010  ?  incisional, [facility], [facility]  ? INCISIONAL HERNIA REPAIR  10/12/2010  ? Procedure: HERNIA REPAIR INCISIONAL;  Surgeon: Dalia Heading;  Location: [facility];  Service: General;  Laterality: N/A;  Recurrent Incisional Hernia Repair with Mesh  ? KNEE SURGERY Right 01/2018  ? meniscal tear   ? KNEE SURGERY Right 12/2019  ? PANNICULECTOMY  2008  ? [facility]  ? SHOULDER SURGERY Left   ? TENNIS ELBOW RELEASE/NIRSCHEL PROCEDURE Bilateral   ? ?Social History  ? ?Social History Narrative  ? Not on file  ? ? ?There is no immunization history on file for this patient.  ? ?Objective: ?Vital Signs: BP 118/80 (BP Location: Left Arm, Patient Position: Sitting, Cuff Size: Normal)   Pulse (!) 103   Ht  (1.626 m)   Wt 232 lb 3.2 oz (105.3 kg)   BMI 39.86 kg/m?   ? ?Physical Exam ?Vitals and nursing note reviewed.  ?Constitutional:   ?   Appearance: She is well-developed.  ?HENT:  ?   Head: Normocephalic and atraumatic.  ?Eyes:  ?   Conjunctiva/sclera: Conjunctivae normal.  ?Cardiovascular:  ?   Rate and Rhythm: Normal rate and regular rhythm.  ?   Heart sounds: Normal heart sounds.  ?Pulmonary:  ?   Effort: Pulmonary effort is normal.  ?   Breath sounds: Normal breath sounds.  ?Abdominal:  ?   General: Bowel sounds are normal.  ?   Palpations: Abdomen is soft.  ?Musculoskeletal:  ?   Cervical back: Normal range of motion.  ?Skin: ?   General: Skin is warm and dry.  ?   Capillary Refill: Capillary refill takes less than 2 seconds.  ?Neurological:  ?   Mental Status: She is alert and oriented to person, place, and time.  ?Psychiatric:     ?   Behavior: Behavior normal.  ?  ? ?Musculoskeletal Exam:  Generalized hyperalgesia and positive tender points on exam.  C-spine has limited range of motion.  Postural thoracic kyphosis noted.  Midline spinal tenderness in the lumbar region.  Shoulder joints both have painful and limited range of motion.  Elbow joints have good range of motion with no joint tenderness.  Synovitis and tenderness of both wrist joints noted.  She has tenderness and synovitis over several MCP and PIP joints.  PIP and DIP thickening consistent with osteoarthritis of both hands.  Right knee has limited extension with warmth and swelling.  Left knee has good range of motion with no warmth or swelling.  Ankle joints have good range of motion with some tenderness of the right ankle. ? ?CDAI Exam: ?CDAI Score: 21.8  ?Patient Global: 9 mm; Provider Global: 9 mm ?Swollen: 10 ;  Tender: 10  ?Joint Exam 08/10/2021  ? ?   Right  Left  ?Wrist  Swollen Tender  Swollen Tender  ?MCP 1  Swollen Tender     ?MCP 2  Swollen Tender  Swollen Tender  ?MCP 3  Swollen Tender     ?PIP 2  Swollen Tender  Swollen Tender  ?PIP 3  Swollen Tender     ?Knee  Swollen Tender     ? ? ? ?Investigation: ?No additional findings. ? ?Imaging: ?No results found. ? ?Recent Labs: ?Lab Results  ?Component Value Date  ? WBC 9.3 04/13/2021  ? HGB 10.6 (L) 04/13/2021  ? PLT 330 04/13/2021  ? NA 135 04/13/2021  ? K 3.8 04/13/2021  ? CL 100 04/13/2021  ? CO2 30 04/13/2021  ? GLUCOSE 85 04/13/2021  ? BUN 14 04/13/2021  ? CREATININE 0.93 04/13/2021  ? BILITOT 0.3 04/13/2021  ? ALKPHOS 84 12/05/2009  ? AST 16 04/13/2021  ? ALT 23 04/13/2021  ? PROT 5.9 (L) 04/13/2021  ? ALBUMIN 3.1 (L) 12/05/2009  ? CALCIUM 8.6 04/13/2021  ? GFRAA 67 09/02/2020  ? QFTBGOLDPLUS NEGATIVE 04/13/2021  ? ? ?Speciality Comments: No specialty comments available. ? ?Procedures:  ?No procedures performed ?Allergies: Patient has no known allergies.  ? ?Assessment / Plan:     ?Visit Diagnoses: Seronegative rheumatoid arthritis (HCC): She has ongoing joint tenderness and  synovitis involving multiple joints including both wrists, several MCP and PIP joints, and the right knee.  She has been experiencing recurrent flares involving multiple joints.  She remains on Arava 20 mg 1 tablet daily an

## 2021-08-11 ENCOUNTER — Ambulatory Visit (INDEPENDENT_AMBULATORY_CARE_PROVIDER_SITE_OTHER): Payer: Medicare HMO | Admitting: Pharmacist

## 2021-08-11 ENCOUNTER — Ambulatory Visit: Payer: Medicare HMO | Admitting: Rheumatology

## 2021-08-11 DIAGNOSIS — Z7189 Other specified counseling: Secondary | ICD-10-CM

## 2021-08-11 DIAGNOSIS — Z79899 Other long term (current) drug therapy: Secondary | ICD-10-CM

## 2021-08-11 DIAGNOSIS — M0600 Rheumatoid arthritis without rheumatoid factor, unspecified site: Secondary | ICD-10-CM

## 2021-08-11 LAB — COMPLETE METABOLIC PANEL WITH GFR
AG Ratio: 1.7 (calc) (ref 1.0–2.5)
ALT: 17 U/L (ref 6–29)
AST: 17 U/L (ref 10–35)
Albumin: 3.8 g/dL (ref 3.6–5.1)
Alkaline phosphatase (APISO): 113 U/L (ref 37–153)
BUN: 10 mg/dL (ref 7–25)
CO2: 29 mmol/L (ref 20–32)
Calcium: 9.2 mg/dL (ref 8.6–10.4)
Chloride: 100 mmol/L (ref 98–110)
Creat: 0.82 mg/dL (ref 0.50–1.03)
Globulin: 2.3 g/dL (calc) (ref 1.9–3.7)
Glucose, Bld: 94 mg/dL (ref 65–99)
Potassium: 4.4 mmol/L (ref 3.5–5.3)
Sodium: 136 mmol/L (ref 135–146)
Total Bilirubin: 0.5 mg/dL (ref 0.2–1.2)
Total Protein: 6.1 g/dL (ref 6.1–8.1)
eGFR: 87 mL/min/{1.73_m2} (ref >=60)

## 2021-08-11 LAB — CBC WITH DIFFERENTIAL/PLATELET
Absolute Monocytes: 561 cells/uL (ref 200–950)
Basophils Absolute: 49 cells/uL (ref 0–200)
Basophils Relative: 0.8 %
Eosinophils Absolute: 159 cells/uL (ref 15–500)
Eosinophils Relative: 2.6 %
HCT: 37.5 % (ref 35.0–45.0)
Hemoglobin: 11.5 g/dL — ABNORMAL LOW (ref 11.7–15.5)
Lymphs Abs: 2172 cells/uL (ref 850–3900)
MCH: 23.7 pg — ABNORMAL LOW (ref 27.0–33.0)
MCHC: 30.7 g/dL — ABNORMAL LOW (ref 32.0–36.0)
MCV: 77.2 fL — ABNORMAL LOW (ref 80.0–100.0)
MPV: 9.7 fL (ref 7.5–12.5)
Monocytes Relative: 9.2 %
Neutro Abs: 3160 cells/uL (ref 1500–7800)
Neutrophils Relative %: 51.8 %
Platelets: 384 10*3/uL (ref 140–400)
RBC: 4.86 10*6/uL (ref 3.80–5.10)
RDW: 16.8 % — ABNORMAL HIGH (ref 11.0–15.0)
Total Lymphocyte: 35.6 %
WBC: 6.1 10*3/uL (ref 3.8–10.8)

## 2021-08-11 MED ORDER — ORENCIA CLICKJECT 125 MG/ML ~~LOC~~ SOAJ: 125.0000 mg | SUBCUTANEOUS | 0 refills | Status: DC

## 2021-08-11 NOTE — Progress Notes (Signed)
Pharmacy Note ? ?Subjective:   ?Patient presents to clinic today to receive first dose of Orencia for seronegative rheumatoid arthritis. No history of COPD, diabetes, or diverticulitis. ? ?Patient running a fever or have signs/symptoms of infection? No ? ?Patient currently on antibiotics for the treatment of infection? No ? ?Patient have any upcoming invasive procedures/surgeries? No ? ?Objective: ?CMP  ?   ?Component Value Date/Time  ? NA 136 08/10/2021 0936  ? K 4.4 08/10/2021 0936  ? CL 100 08/10/2021 0936  ? CO2 29 08/10/2021 0936  ? GLUCOSE 94 08/10/2021 0936  ? BUN 10 08/10/2021 0936  ? CREATININE 0.82 08/10/2021 0936  ? CALCIUM 9.2 08/10/2021 0936  ? PROT 6.1 08/10/2021 0936  ? ALBUMIN 3.1 (L) 12/05/2009 1921  ? AST 17 08/10/2021 0936  ? ALT 17 08/10/2021 0936  ? ALKPHOS 84 12/05/2009 1921  ? BILITOT 0.5 08/10/2021 0936  ? GFRNONAA 58 (L) 09/02/2020 1321  ? GFRAA 67 09/02/2020 1321  ? ? ?CBC ?   ?Component Value Date/Time  ? WBC 6.1 08/10/2021 0936  ? RBC 4.86 08/10/2021 0936  ? HGB 11.5 (L) 08/10/2021 0936  ? HCT 37.5 08/10/2021 0936  ? PLT 384 08/10/2021 0936  ? MCV 77.2 (L) 08/10/2021 0936  ? MCH 23.7 (L) 08/10/2021 0936  ? MCHC 30.7 (L) 08/10/2021 0936  ? RDW 16.8 (H) 08/10/2021 0936  ? LYMPHSABS 2,172 08/10/2021 0936  ? MONOABS 0.5 12/05/2009 1921  ? EOSABS 159 08/10/2021 0936  ? BASOSABS 49 08/10/2021 0936  ? ? ?Baseline Immunosuppressant Therapy Labs ?TB GOLD ? ?  Latest Ref Rng & Units 04/13/2021  ?  2:38 PM  ?Quantiferon TB Gold  ?Quantiferon TB Gold Plus NEGATIVE NEGATIVE    ? ?Hepatitis Panel ? ?  Latest Ref Rng & Units 10/27/2019  ? 12:19 PM  ?Hepatitis  ?Hep B Surface Ag NON-REACTI NON-REACTIVE    ?Hep B IgM NON-REACTI NON-REACTIVE    ?Hep C Ab NON-REACTI NON-REACTIVE    ?Hep A IgM NON-REACTI NON-REACTIVE    ? ?HIV ?Lab Results  ?Component Value Date  ? HIV NON-REACTIVE 10/27/2019  ? ?Immunoglobulins ? ?  Latest Ref Rng & Units 10/27/2019  ? 12:19 PM  ?Immunoglobulin Electrophoresis  ?IgA  47 - 310  mg/dL 159    ?IgG 600 - 1,640 mg/dL 458    ?IgM 50 - 300 mg/dL 37    ? ?SPEP ? ?  Latest Ref Rng & Units 08/10/2021  ?  9:36 AM  ?Serum Protein Electrophoresis  ?Total Protein 6.1 - 8.1 g/dL 6.1    ? ?G6PD ?Lab Results  ?Component Value Date  ? G6PDH 26.6 (H) 06/05/2019  ? ?TPMT ?No results found for: TPMT  ? ?Chest x-ray: 12/15/16 - no evidence of acute cardiopulmonary disease ? ?Assessment/Plan:  ?Reviewed importance of holding both Orencia and leflunomide with procedures, infections, and antibiotics. ? ?Demonstrated proper injection technique with Orencia demo device  Patient able to demonstrate proper injection technique using the teach back method.  Patient self injected in the right abdomen with: ? ?Patient-Supplied Medication: Orencia 125mg /mL Clickject pen ?NDC: [phone] ?Lot: [id number] ?Expiration: 01/2023 ? ?Patient tolerated well.  Observed for 30 mins in office for adverse reaction and none noted.  ? ?Patient is to return in 1 month for labs and 6-8 weeks for follow-up appointment.  Standing orders placed.  ? ?Orencia approved through patient assistance .   Rx sent to: [facility] for Orencia: [phone]. She has three more doses at home. ? ?She  will continue Orencia  SQ once weekly in combination with leflunomide  daily ? ?All questions encouraged and answered.  Instructed patient to call with any further questions or concerns. ? ?Chesley Mires, PharmD, MPH, BCPS ?Clinical Pharmacist (Rheumatology and Pulmonology) ? ?08/11/2021 10:29 AM ?

## 2021-08-11 NOTE — Patient Instructions (Signed)
Your next ORENCIA dose is due on 08/18/21, 08/25/21, and every 7 days thereafter ? ?CONTINUE leflunomide 20mg  daily ? ?HOLD ORENCIA and leflunomide if you have signs or symptoms of an infection. You can resume once you feel better or back to your baseline. ?HOLD ORENCIA and leflunomide if you start antibiotics to treat an infection. ?HOLD ORENCIA and leflunomide around the time of surgery/procedures. Your surgeon will be able to provide recommendations on when to hold BEFORE and when you are cleared to RESUME. ? ?Pharmacy information: ?Your prescription will be shipped from [facility] ?Their phone number is [phone] ?Please call to schedule shipment and confirm address. They will mail your medication to your home. ? ?Labs are due in 1 month then every 3 months. ?Lab hours are from Monday to Thursday 1:30-4:30pm and Friday 1:30-4pm. You do not need an appointment if you come for labs during these times. ? ?How to manage an injection site reaction: ?Remember the 5 C's: ?COUNTER - leave on the counter at least 30 minutes but up to overnight to bring medication to room temperature. This may help prevent stinging ?COLD - place something cold (like an ice gel pack or cold water bottle) on the injection site just before cleansing with alcohol. This may help reduce pain ?CLARITIN - use Claritin (generic name is loratadine) for the first two weeks of treatment or the day of, the day before, and the day after injecting. This will help to minimize injection site reactions ?CORTISONE CREAM - apply if injection site is irritated and itching ?CALL ME - if injection site reaction is bigger than the size of your fist, looks infected, blisters, or if you develop hives  ?

## 2021-08-11 NOTE — Progress Notes (Signed)
CMP WNL. Anemia has improved.  Patient plans to start [place] today.

## 2021-09-21 ENCOUNTER — Ambulatory Visit: Payer: Medicare HMO | Admitting: Physician Assistant

## 2021-09-22 ENCOUNTER — Other Ambulatory Visit: Payer: Self-pay | Admitting: Neurology

## 2021-09-22 ENCOUNTER — Other Ambulatory Visit (HOSPITAL_COMMUNITY): Payer: Self-pay | Admitting: Neurology

## 2021-09-22 DIAGNOSIS — M5010 Cervical disc disorder with radiculopathy, unspecified cervical region: Secondary | ICD-10-CM

## 2021-10-06 ENCOUNTER — Ambulatory Visit: Payer: Medicare HMO | Admitting: Physician Assistant

## 2021-10-06 ENCOUNTER — Other Ambulatory Visit: Payer: Self-pay

## 2021-10-06 ENCOUNTER — Encounter: Payer: Self-pay | Admitting: Physician Assistant

## 2021-10-06 VITALS — BP 107/71 | HR 103 | Ht 65.0 in | Wt 238.2 lb

## 2021-10-06 DIAGNOSIS — M19072 Primary osteoarthritis, left ankle and foot: Secondary | ICD-10-CM

## 2021-10-06 DIAGNOSIS — M7062 Trochanteric bursitis, left hip: Secondary | ICD-10-CM

## 2021-10-06 DIAGNOSIS — G5601 Carpal tunnel syndrome, right upper limb: Secondary | ICD-10-CM

## 2021-10-06 DIAGNOSIS — M19042 Primary osteoarthritis, left hand: Secondary | ICD-10-CM

## 2021-10-06 DIAGNOSIS — R5383 Other fatigue: Secondary | ICD-10-CM

## 2021-10-06 DIAGNOSIS — M5030 Other cervical disc degeneration, unspecified cervical region: Secondary | ICD-10-CM

## 2021-10-06 DIAGNOSIS — Z79899 Other long term (current) drug therapy: Secondary | ICD-10-CM | POA: Diagnosis not present

## 2021-10-06 DIAGNOSIS — M62838 Other muscle spasm: Secondary | ICD-10-CM

## 2021-10-06 DIAGNOSIS — E559 Vitamin D deficiency, unspecified: Secondary | ICD-10-CM

## 2021-10-06 DIAGNOSIS — F331 Major depressive disorder, recurrent, moderate: Secondary | ICD-10-CM

## 2021-10-06 DIAGNOSIS — M19071 Primary osteoarthritis, right ankle and foot: Secondary | ICD-10-CM

## 2021-10-06 DIAGNOSIS — G4709 Other insomnia: Secondary | ICD-10-CM

## 2021-10-06 DIAGNOSIS — M0600 Rheumatoid arthritis without rheumatoid factor, unspecified site: Secondary | ICD-10-CM | POA: Diagnosis not present

## 2021-10-06 DIAGNOSIS — M7061 Trochanteric bursitis, right hip: Secondary | ICD-10-CM

## 2021-10-06 DIAGNOSIS — M19041 Primary osteoarthritis, right hand: Secondary | ICD-10-CM

## 2021-10-06 DIAGNOSIS — M170 Bilateral primary osteoarthritis of knee: Secondary | ICD-10-CM

## 2021-10-06 DIAGNOSIS — M797 Fibromyalgia: Secondary | ICD-10-CM

## 2021-10-06 DIAGNOSIS — M25461 Effusion, right knee: Secondary | ICD-10-CM

## 2021-10-06 MED ORDER — LIDOCAINE HCL 1 % IJ SOLN
0.5000 mL | INTRAMUSCULAR | Status: AC | PRN
  Administered 2021-10-06: .5 mL

## 2021-10-06 MED ORDER — TRIAMCINOLONE ACETONIDE 40 MG/ML IJ SUSP
10.0000 mg | INTRAMUSCULAR | Status: AC | PRN
  Administered 2021-10-06: 10 mg via INTRAMUSCULAR

## 2021-10-06 NOTE — Progress Notes (Signed)
Office Visit Note  Patient: Amanda Hammond             Date of Birth: [birth year]           MRN: [id number]             PCP: Suzan Slick, MD Referring: Suzan Slick, MD Visit Date: 10/06/2021 Occupation: @GUAROCC @  Subjective:  Pain in multiple joints   History of Present Illness: Amanda Hammond is a 51 y.o. female with history of rheumatoid arthritis and fibromyalgia. She is on orencia 125 mg sq injections once weekly and arava 20 mg daily.  She was started on orencia on 08/11/21.  She has been tolerating combination therapy without any side effects or injection site reactions.  She has not noticed any improvement in her symptoms since starting on Orencia.  She continues to have significant pain and inflammation in both hands, right wrist, and right knee joint.  She states she is scheduled for an NCV with EMG to assess for carpal tunnel today.  She states her left ring finger has started to lock more frequently.  She is also having increased trapezius muscle tension and tenderness bilaterally.  She experiences muscle spasms intermittently.  She requested trigger point injections today. She denies any recent infections.  She denies any new medical conditions.      Activities of Daily Living:  Patient reports morning stiffness for 3-4 hours.   Patient Reports nocturnal pain.  Difficulty dressing/grooming: Reports Difficulty climbing stairs: Reports Difficulty getting out of chair: Reports Difficulty using hands for taps, buttons, cutlery, and/or writing: Reports  Review of Systems  Constitutional:  Positive for fatigue.  HENT:  Positive for mouth dryness and nose dryness. Negative for mouth sores.   Eyes:  Positive for dryness. Negative for pain and itching.  Respiratory:  Negative for shortness of breath and difficulty breathing.   Cardiovascular:  Negative for chest pain and palpitations.  Gastrointestinal:  Positive for constipation and diarrhea. Negative for blood  in stool.  Endocrine: Negative for increased urination.  Genitourinary:  Negative for difficulty urinating.  Musculoskeletal:  Positive for joint pain, joint pain, joint swelling, myalgias, morning stiffness, muscle tenderness and myalgias.  Skin:  Negative for color change, rash and redness.  Allergic/Immunologic: Negative for susceptible to infections.  Neurological:  Positive for dizziness, numbness, headaches and weakness. Negative for memory loss.  Hematological:  Negative for bruising/bleeding tendency.  Psychiatric/Behavioral:  Negative for confusion.     PMFS History:  Patient Active Problem List   Diagnosis Date Noted   Rheumatoid factor positive 11/06/2016   Trochanteric bursitis of both hips 11/06/2016   History of fibromyalgia 11/06/2016   DDD (degenerative disc disease), cervical 11/06/2016   Carpal tunnel syndrome, left upper limb 11/06/2016   Carpal tunnel syndrome, right upper limb 11/06/2016   Trigger finger, left middle finger 11/06/2016   Major depressive disorder, recurrent episode, moderate (HCC) 04/27/2016   Pain in left hand 04/25/2016   Pain in right hand 04/25/2016   Primary osteoarthritis of both hands 04/25/2016   Primary osteoarthritis of both knees 04/25/2016   Primary osteoarthritis of both feet 04/25/2016   Fibromyalgia 02/17/2016   Other insomnia 02/17/2016   Fatigue 02/17/2016   B12 DEFICIENCY 02/01/2010   Vitamin D deficiency 02/01/2010   ANEMIA, IRON DEFICIENCY 12/30/2009   OTHER DYSPNEA AND RESPIRATORY ABNORMALITIES 12/30/2009   Nonspecific (abnormal) findings on radiological and other examination of body structure 12/30/2009   COMPUTERIZED TOMOGRAPHY, CHEST, ABNORMAL 12/30/2009  Past Medical History:  Diagnosis Date   Anemia    Fibromyalgia    Hypertension    Osteoarthritis    PONV (postoperative nausea and vomiting)    Rheumatoid arthritis (HCC)    Vitamin D deficiency     Family History  Problem Relation Age of Onset   Diabetes  Mother    Hypertension Mother    Hypertension Father    Hypertension Brother    Osteoarthritis Brother    Hypertension Brother    Osteoarthritis Brother    Endometriosis Daughter    Anesthesia problems Neg Hx    Hypotension Neg Hx    Pseudochol deficiency Neg Hx    Malignant hyperthermia Neg Hx    Past Surgical History:  Procedure Laterality Date   APPENDECTOMY  2010   [facility]   CARPAL TUNNEL RELEASE Left    CHOLECYSTECTOMY  1999   lap, [facility]   GASTRIC BYPASS  2003   [facility]   HERNIA REPAIR  08/2010, 02/2010    incisional, [facility], [facility]   INCISIONAL HERNIA REPAIR  10/12/2010   Procedure: HERNIA REPAIR INCISIONAL;  Surgeon: Dalia Heading;  Location: [facility];  Service: General;  Laterality: N/A;  Recurrent Incisional Hernia Repair with Mesh   KNEE SURGERY Right 01/2018   meniscal tear    KNEE SURGERY Right 12/2019   PANNICULECTOMY  2008   [facility]   SHOULDER SURGERY Left    TENNIS ELBOW RELEASE/NIRSCHEL PROCEDURE Bilateral    Social History   Social History Narrative   Not on file    There is no immunization history on file for this patient.   Objective: Vital Signs: BP 107/71 (BP Location: Left Arm, Patient Position: Sitting, Cuff Size: Normal)   Pulse (!) 103   Ht 5\' 5"  (1.651 m)   Wt 238 lb 3.2 oz (108 kg)   BMI 39.64 kg/m    Physical Exam Vitals and nursing note reviewed.  Constitutional:      Appearance: She is well-developed.  HENT:     Head: Normocephalic and atraumatic.  Eyes:     Conjunctiva/sclera: Conjunctivae normal.  Cardiovascular:     Rate and Rhythm: Normal rate and regular rhythm.     Heart sounds: Normal heart sounds.  Pulmonary:     Effort: Pulmonary effort is normal.     Breath sounds: Normal breath sounds.  Abdominal:     General: Bowel sounds are normal.     Palpations: Abdomen is soft.  Musculoskeletal:     Cervical back: Normal range of motion.  Skin:    General: Skin is warm and dry.     Capillary Refill: Capillary refill takes less than 2  seconds.  Neurological:     Mental Status: She is alert and oriented to person, place, and time.  Psychiatric:        Behavior: Behavior normal.      Musculoskeletal Exam: C-spine has limited range of motion without rotation.  Trapezius muscle tension and tenderness bilaterally.  Postural thoracic kyphosis noted.  Shoulder joints have painful range of motion to about 90 degrees bilaterally.  Elbow joints have good range of motion with no tenderness or inflammation.  Tenderness and synovitis of the right wrist joint.  Tenderness and synovitis of the first through third MCP joints of the right hand.  Denies any synovitis of the left fifth MCP joint.  Tenderness over several PIP joints as described below.  Hip joints have slightly limited range of motion bilaterally.  Right knee joint has  warmth and a small to moderate effusion.  Left knee joint has good range of motion with no warmth or effusion.  Ankle joints have good range of motion with no tenderness or synovitis at this time.  CDAI Exam: CDAI Score: 21.4  Patient Global: 8 mm; Provider Global: 6 mm Swollen: 7 ; Tender: 13  Joint Exam 10/06/2021      Right  Left  Wrist  Swollen Tender     MCP 1  Swollen Tender     MCP 2  Swollen Tender   Tender  MCP 3  Swollen Tender     MCP 5     Swollen Tender  IP   Tender     PIP 2  Swollen Tender   Tender  PIP 3   Tender     PIP 4      Tender  PIP 5      Tender  Knee  Swollen Tender        Investigation: No additional findings.  Imaging: No results found.  Recent Labs: Lab Results  Component Value Date   WBC 6.1 08/10/2021   HGB 11.5 (L) 08/10/2021   PLT 384 08/10/2021   NA 136 08/10/2021   K 4.4 08/10/2021   CL 100 08/10/2021   CO2 29 08/10/2021   GLUCOSE 94 08/10/2021   BUN 10 08/10/2021   CREATININE 0.82 08/10/2021   BILITOT 0.5 08/10/2021   ALKPHOS 84 12/05/2009   AST 17 08/10/2021   ALT 17 08/10/2021   PROT 6.1 08/10/2021   ALBUMIN 3.1 (L) 12/05/2009   CALCIUM 9.2  08/10/2021   GFRAA 67 09/02/2020   QFTBGOLDPLUS NEGATIVE 04/13/2021    Speciality Comments: Orencia started 08/11/21  Procedures:  Trigger Point Inj  Date/Time: 10/06/2021 10:08 AM  Performed by: Gearldine Bienenstock, PA-C Authorized by: Gearldine Bienenstock, PA-C   Consent Given by:  Patient Site marked: the procedure site was marked   Timeout: prior to procedure the correct patient, procedure, and site was verified   Indications:  Pain Total # of Trigger Points:  2 Location: neck   Needle Size:  27 G Approach:  Dorsal Medications #1:  0.5 mL lidocaine 1 %; 10 mg triamcinolone acetonide 40 MG/ML Medications #2:  0.5 mL lidocaine 1 %; 10 mg triamcinolone acetonide 40 MG/ML Patient tolerance:  Patient tolerated the procedure well with no immediate complications  Allergies: Patient has no known allergies.   Assessment / Plan:     Visit Diagnoses: Seronegative rheumatoid arthritis (HCC): She continues to have ongoing joint tenderness and synovitis involving multiple joints.  Her symptoms have been most severe in the right wrist, both hands, and right knee joint.  She has noticed no improvement since initiating Orencia on 08/11/2021.  She has been tolerating Orencia without any side effects or injection site reactions.  She remains on Arava 20 mg daily.  She continues to have recurrent flares despite combination therapy.  Different treatment options were discussed today.  She is willing to give Orencia more time prior to switching to a different biologic.  She is not a good candidate for TNF inhibitors due to family history of MS.  Discussed restarting Plaquenil as triple therapy and she was in agreement.  She previously tolerated Plaquenil without any side effects.  Indications, contraindications, potential side effects of Plaquenil were reviewed again today.  Consent was updated and she was given an eye examination form to take with her to yearly eye exams.  She will follow-up in  the office in 6 to 8  weeks to assess her response to triple therapy.  Patient was counseled on the purpose, proper use, and adverse effects of hydroxychloroquine including nausea/diarrhea, skin rash, headaches, and sun sensitivity.  Advised patient to wear sunscreen once starting hydroxychloroquine to reduce risk of rash associated with sun sensitivity.  Discussed importance of annual eye exams while on hydroxychloroquine to monitor to ocular toxicity and discussed importance of frequent laboratory monitoring.  Provided patient with eye exam form for baseline ophthalmologic exam.  Reviewed risk for QTC prolongation when used in combination with other QTc prolonging agents (including but not limited to antiarrhythmics, macrolide antibiotics, flouroquinolones, tricyclic antidepressants, citalopram, specific antipsychotics, ondansetron, migraine triptans, and methadone). Provided patient with educational materials on hydroxychloroquine and answered all questions.  Patient consented to hydroxychloroquine. Will upload consent in the media tab.    Dose will be Plaquenil 200 mg twice daily.  Prescription pending lab results.  High risk medication use -Orencia 125 mg subcutaneous injections once weekly and Arava 20 mg daily.  Dub Amis was initiated on 08/11/2021.  She is tolerating Orencia without any side effects or injection site reactions.  She is not a good candidate for TNF inhibitors due to family history of MS.  She previously tolerated Plaquenil but had an inadequate response.  Plaquenil will be restarted as triple therapy.  She will take Plaquenil 200 mg 1 tablet twice daily.  A prescription for Plaquenil will be sent to the pharmacy pending lab results today.   CBC and CMP updated on 08/10/21.  She is due to update lab work today. Orders released.  Her next lab work will be due in October and every 3 months.  TB gold negative on 04/13/21.   Discussed the importance of holding orencia and arava if she develops signs or symptoms of  an infection and to resume once the infection has completely cleared. Plan: CBC with Differential/Platelet, COMPLETE METABOLIC PANEL WITH GFR  Primary osteoarthritis of both hands: She has PIP and DIP thickening consistent with osteoarthritis of both hands.  She continues to have persistent pain and inflammation involving several MCP joints due to active rheumatoid arthritis.  She has difficulty making complete fist due to the severity of pain and stiffness she is experiencing.  Discussed the importance of joint protection and muscle strengthening.  Carpal tunnel syndrome, right upper limb: Patient is scheduled for an updated NCV with EMG today.  Her symptoms are likely exacerbated by active rheumatoid arthritis.  She has tenderness and synovitis of the right wrist on examination today.  Trochanteric bursitis of both hips: She continues to have ongoing discomfort due to trochanter bursitis of both hips.  Discussed importance of performing stretching exercises daily.  Primary osteoarthritis of both knees: She has good range of motion of the left knee joint with no discomfort.  No warmth or effusion noted.  She has ongoing pain in the left knee with warmth and synovitis.  She has difficulty ambulating prolonged distances due to the severity of pain.  She also has difficulty climbing steps and rising from a seated position due to the severity of pain and stiffness.  She is not planning on proceeding with a knee replacement at this time.  Effusion, right knee: Chronic pain.  Small to moderate effusion noted on examination today.  Primary osteoarthritis of both feet: She experiences intermittent discomfort in both feet.  She has good range of motion of both ankle joints on examination today with no tenderness or synovitis.  DDD (degenerative disc disease), cervical: C-spine has limited range of motion with lateral rotation.  She presents today with trapezius muscle tension and tenderness bilaterally.  She  requested trigger point injections today.  Procedure note was completed above.  Fibromyalgia: She has generalized hyperalgesia and positive tender points on examination.  She remains on tramadol, Topamax, tizanidine, Lyrica, and Cymbalta as prescribed.  She requested trigger point injections today to alleviate the trapezius muscle spasms she has been experiencing.  She tolerated procedure well.  Discussed the importance of regular exercise and good sleep hygiene.  Trapezius muscle spasm -She presents today with trapezius muscle tension and tenderness bilaterally.  She has been experiencing muscle spasms intermittently.  She has been using Lidoderm patches as needed for symptomatic relief.  She requested trigger point injections today.  She tolerated the procedure well.  Procedure note was completed above.  Aftercare was discussed.  Plan: Trigger Point Inj  Other insomnia: She continues to experience difficulty sleeping at night due to nocturnal pain.  Other fatigue: Chronic and secondary to insomnia.  Other medical conditions are listed as follows:  Vitamin D deficiency  Major depressive disorder, recurrent episode, moderate (HCC)    Orders: Orders Placed This Encounter  Procedures   Trigger Point Inj   CBC with Differential/Platelet   COMPLETE METABOLIC PANEL WITH GFR   No orders of the defined types were placed in this encounter.    Follow-Up Instructions: Return in about 2 months (around 12/07/2021) for Rheumatoid arthritis, Fibromyalgia.   Gearldine Bienenstock, PA-C  Note - This record has been created using Dragon software.  Chart creation errors have been sought, but may not always  have been located. Such creation errors do not reflect on  the standard of medical care.

## 2021-10-06 NOTE — Telephone Encounter (Signed)
Pending lab results, patient will be starting Plaquenil per Sherron Ales, PA-C. Thanks!

## 2021-10-06 NOTE — Progress Notes (Signed)
Pharmacy Note  Subjective: Patient presents today to [facility] for follow up office visit.   Patient seen by the pharmacist for counseling on hydroxychloroquine for rheumatoid arthritis.  She started Orencia on 08/11/21. Takes this in combination with leflunomide 20mg  daily  Objective: CMP     Component Value Date/Time   NA 136 08/10/2021 0936   K 4.4 08/10/2021 0936   CL 100 08/10/2021 0936   CO2 29 08/10/2021 0936   GLUCOSE 94 08/10/2021 0936   BUN 10 08/10/2021 0936   CREATININE 0.82 08/10/2021 0936   CALCIUM 9.2 08/10/2021 0936   PROT 6.1 08/10/2021 0936   ALBUMIN 3.1 (L) 12/05/2009 1921   AST 17 08/10/2021 0936   ALT 17 08/10/2021 0936   ALKPHOS 84 12/05/2009 1921   BILITOT 0.5 08/10/2021 0936   GFRNONAA 58 (L) 09/02/2020 1321   GFRAA 67 09/02/2020 1321    CBC    Component Value Date/Time   WBC 6.1 08/10/2021 0936   RBC 4.86 08/10/2021 0936   HGB 11.5 (L) 08/10/2021 0936   HCT 37.5 08/10/2021 0936   PLT 384 08/10/2021 0936   MCV 77.2 (L) 08/10/2021 0936   MCH 23.7 (L) 08/10/2021 0936   MCHC 30.7 (L) 08/10/2021 0936   RDW 16.8 (H) 08/10/2021 0936   LYMPHSABS 2,172 08/10/2021 0936   MONOABS 0.5 12/05/2009 1921   EOSABS 159 08/10/2021 0936   BASOSABS 49 08/10/2021 0936    Assessment/Plan: Patient was counseled on the purpose, proper use, and adverse effects of hydroxychloroquine including nausea/diarrhea, skin rash, headaches, and sun sensitivity.  Advised patient to wear sunscreen once starting hydroxychloroquine to reduce risk of rash associated with sun sensitivity.  Discussed importance of annual eye exams while on hydroxychloroquine to monitor to ocular toxicity and discussed importance of frequent laboratory monitoring.  Provided patient with eye exam form for baseline ophthalmologic exam.  Reviewed risk for QTC prolongation when used in combination with other QTc prolonging agents (including but not limited to antiarrhythmics, macrolide  antibiotics, flouroquinolones, tricyclic antidepressants, citalopram, specific antipsychotics, ondansetron, migraine triptans, and methadone). Provided patient with educational materials on hydroxychloroquine and answered all questions.  Patient consented to hydroxychloroquine. Will upload consent in the media tab.    Dose will be Plaquenil 200 mg twice daily.  Prescription pending lab results.  She will continue Orencia 125mg  SQ once weekly and leflunomide 20mg  daily  10/10/2021, PharmD, MPH, BCPS, CPP Clinical Pharmacist (Rheumatology and Pulmonology)

## 2021-10-06 NOTE — Patient Instructions (Signed)
Standing Labs We placed an order today for your standing lab work.   Please have your standing labs drawn in 1 month then every 3 months   If possible, please have your labs drawn 2 weeks prior to your appointment so that the provider can discuss your results at your appointment.  Please note that you may see your imaging and lab results in MyChart before we have reviewed them. We may be awaiting multiple results to interpret others before contacting you. Please allow our office up to 72 hours to thoroughly review all of the results before contacting the office for clarification of your results.  We have open lab daily: Monday through Thursday from 1:30-4:30 PM and Friday from 1:30-4:00 PM at the office of Dr. Pollyann Savoy, [facility].   Please be advised, all patients with office appointments requiring lab work will take precedent over walk-in lab work.  If possible, please come for your lab work on Monday and Friday afternoons, as you may experience shorter wait times. The office is located at [address], [place], [place] [address] No appointment is necessary.   Labs are drawn by [facility]. Please bring your co-pay at the time of your lab draw.  You may receive a bill from [facility] for your lab work.  Please note if you are on Hydroxychloroquine and and an order has been placed for a Hydroxychloroquine level, you will need to have it drawn 4 hours or more after your last dose.  If you wish to have your labs drawn at another location, please call the office 24 hours in advance to send orders.  If you have any questions regarding directions or hours of operation,  please call [phone].   As a reminder, please drink plenty of water prior to coming for your lab work. Thanks!   Hydroxychloroquine Tablets What is this medication? HYDROXYCHLOROQUINE (hye drox ee KLOR oh kwin) treats autoimmune conditions, such as rheumatoid arthritis and lupus. It works by  slowing down an overactive immune system. It may also be used to prevent and treat malaria. It works by killing the parasite that causes malaria. It belongs to a group of medications called DMARDs. This medicine may be used for other purposes; ask your health care provider or pharmacist if you have questions. COMMON BRAND NAME(S): Plaquenil, Quineprox What should I tell my care team before I take this medication? They need to know if you have any of these conditions: Diabetes Eye disease, vision problems G6PD deficiency Heart disease History of irregular heartbeat If you often drink alcohol Kidney disease Liver disease Porphyria Psoriasis An unusual or allergic reaction to chloroquine, hydroxychloroquine, other medications, foods, dyes, or preservatives Pregnant or trying to get pregnant Breast-feeding How should I use this medication? Take this medication by mouth with a glass of water. Take it as directed on the prescription label. Do not cut, crush or chew this medication. Swallow the tablets whole. Take it with food. Do not take it more than directed. Take all of this medication unless your care team tells you to stop it early. Keep taking it even if you think you are better. Take products with antacids in them at a different time of day than this medication. Take this medication 4 hours before or 4 hours after antacids. Talk to your care team if you have questions. Talk to your care team about the use of this medication in children. While this medication may be prescribed for selected conditions, precautions do apply.  Overdosage: If you think you have taken too much of this medicine contact a poison control center or emergency room at once. NOTE: This medicine is only for you. Do not share this medicine with others. What if I miss a dose? If you miss a dose, take it as soon as you can. If it is almost time for your next dose, take only that dose. Do not take double or extra doses. What  may interact with this medication? Do not take this medication with any of the following: Cisapride Dronedarone Pimozide Thioridazine This medication may also interact with the following: Ampicillin Antacids Cimetidine Cyclosporine Digoxin Kaolin Medications for diabetes, like insulin, glipizide, glyburide Medications for seizures like carbamazepine, phenobarbital, phenytoin Mefloquine Methotrexate Other medications that prolong the QT interval (cause an abnormal heart rhythm) Praziquantel This list may not describe all possible interactions. Give your health care provider a list of all the medicines, herbs, non-prescription drugs, or dietary supplements you use. Also tell them if you smoke, drink alcohol, or use illegal drugs. Some items may interact with your medicine. What should I watch for while using this medication? Visit your care team for regular checks on your progress. Tell your care team if your symptoms do not start to get better or if they get worse. You may need blood work done while you are taking this medication. If you take other medications that can affect heart rhythm, you may need more testing. Talk to your care team if you have questions. Your vision may be tested before and during use of this medication. Tell your care team right away if you have any change in your eyesight. This medication may cause serious skin reactions. They can happen weeks to months after starting the medication. Contact your care team right away if you notice fevers or flu-like symptoms with a rash. The rash may be red or purple and then turn into blisters or peeling of the skin. Or, you might notice a red rash with swelling of the face, lips or lymph nodes in your neck or under your arms. If you or your family notice any changes in your behavior, such as new or worsening depression, thoughts of harming yourself, anxiety, or other unusual or disturbing thoughts, or memory loss, call your care  team right away. What side effects may I notice from receiving this medication? Side effects that you should report to your care team as soon as possible: Allergic reactions--skin rash, itching, hives, swelling of the face, lips, tongue, or throat Aplastic anemia--unusual weakness or fatigue, dizziness, headache, trouble breathing, increased bleeding or bruising Change in vision Heart rhythm changes--fast or irregular heartbeat, dizziness, feeling faint or lightheaded, chest pain, trouble breathing Infection--fever, chills, cough, or sore throat Low blood sugar (hypoglycemia)--tremors or shaking, anxiety, sweating, cold or clammy skin, confusion, dizziness, rapid heartbeat Muscle injury--unusual weakness or fatigue, muscle pain, dark yellow or brown urine, decrease in amount of urine Pain, tingling, or numbness in the hands or feet Rash, fever, and swollen lymph nodes Redness, blistering, peeling, or loosening of the skin, including inside the mouth Thoughts of suicide or self-harm, worsening mood, or feelings of depression Unusual bruising or bleeding Side effects that usually do not require medical attention (report to your care team if they continue or are bothersome): Diarrhea Headache Nausea Stomach pain Vomiting This list may not describe all possible side effects. Call your doctor for medical advice about side effects. You may report side effects to FDA at 1-800-FDA-1088. Where should I keep  my medication? Keep out of the reach of children and pets. Store at room temperature up to 30 degrees C (86 degrees F). Protect from light. Get rid of any unused medication after the expiration date. To get rid of medications that are no longer needed or have expired: Take the medication to a medication take-back program. Check with your pharmacy or law enforcement to find a location. If you cannot return the medication, check the label or package insert to see if the medication should be thrown  out in the garbage or flushed down the toilet. If you are not sure, ask your care team. If it is safe to put it in the trash, empty the medication out of the container. Mix the medication with cat litter, dirt, coffee grounds, or other unwanted substance. Seal the mixture in a bag or container. Put it in the trash. NOTE: This sheet is a summary. It may not cover all possible information. If you have questions about this medicine, talk to your doctor, pharmacist, or health care provider.  2023 Elsevier/Gold Standard (2020-08-05 00:00:00)

## 2021-10-07 LAB — CBC WITH DIFFERENTIAL/PLATELET
Absolute Monocytes: 731 cells/uL (ref 200–950)
Basophils Absolute: 50 cells/uL (ref 0–200)
Basophils Relative: 0.7 %
Eosinophils Absolute: 128 cells/uL (ref 15–500)
Eosinophils Relative: 1.8 %
HCT: 35.5 % (ref 35.0–45.0)
Hemoglobin: 11.2 g/dL — ABNORMAL LOW (ref 11.7–15.5)
Lymphs Abs: 3522 cells/uL (ref 850–3900)
MCH: 24.4 pg — ABNORMAL LOW (ref 27.0–33.0)
MCHC: 31.5 g/dL — ABNORMAL LOW (ref 32.0–36.0)
MCV: 77.3 fL — ABNORMAL LOW (ref 80.0–100.0)
MPV: 10.3 fL (ref 7.5–12.5)
Monocytes Relative: 10.3 %
Neutro Abs: 2670 cells/uL (ref 1500–7800)
Neutrophils Relative %: 37.6 %
Platelets: 367 10*3/uL (ref 140–400)
RBC: 4.59 10*6/uL (ref 3.80–5.10)
RDW: 16.6 % — ABNORMAL HIGH (ref 11.0–15.0)
Total Lymphocyte: 49.6 %
WBC: 7.1 10*3/uL (ref 3.8–10.8)

## 2021-10-07 LAB — COMPLETE METABOLIC PANEL WITH GFR
AG Ratio: 1.7 (calc) (ref 1.0–2.5)
ALT: 23 U/L (ref 6–29)
AST: 22 U/L (ref 10–35)
Albumin: 3.8 g/dL (ref 3.6–5.1)
Alkaline phosphatase (APISO): 128 U/L (ref 37–153)
BUN: 14 mg/dL (ref 7–25)
CO2: 29 mmol/L (ref 20–32)
Calcium: 8.9 mg/dL (ref 8.6–10.4)
Chloride: 99 mmol/L (ref 98–110)
Creat: 0.91 mg/dL (ref 0.50–1.03)
Globulin: 2.3 g/dL (calc) (ref 1.9–3.7)
Glucose, Bld: 92 mg/dL (ref 65–99)
Potassium: 3.9 mmol/L (ref 3.5–5.3)
Sodium: 135 mmol/L (ref 135–146)
Total Bilirubin: 0.3 mg/dL (ref 0.2–1.2)
Total Protein: 6.1 g/dL (ref 6.1–8.1)
eGFR: 77 mL/min/{1.73_m2} (ref >=60)

## 2021-10-07 MED ORDER — HYDROXYCHLOROQUINE SULFATE 200 MG PO TABS: 200.0000 mg | ORAL_TABLET | Freq: Two times a day (BID) | ORAL | 2 refills | Status: DC

## 2021-10-07 NOTE — Progress Notes (Signed)
CMP WNL.  Anemia stable.     Ok to send in plaquenil 200 mg 1 tablet by mouth twice daily.  Repeat CBC and CMP in 1 month.  

## 2021-10-07 NOTE — Telephone Encounter (Signed)
CMP WNL.  Anemia stable.     Ok to send in plaquenil 200 mg 1 tablet by mouth twice daily.  Repeat CBC and CMP in 1 month.

## 2021-10-11 ENCOUNTER — Ambulatory Visit (HOSPITAL_COMMUNITY): Admission: RE | Admit: 2021-10-11 | Payer: Medicare HMO | Source: Ambulatory Visit

## 2021-10-11 ENCOUNTER — Encounter (HOSPITAL_COMMUNITY): Payer: Self-pay

## 2021-10-17 ENCOUNTER — Other Ambulatory Visit: Payer: Self-pay | Admitting: *Deleted

## 2021-10-17 DIAGNOSIS — M0600 Rheumatoid arthritis without rheumatoid factor, unspecified site: Secondary | ICD-10-CM

## 2021-10-17 DIAGNOSIS — Z79899 Other long term (current) drug therapy: Secondary | ICD-10-CM

## 2021-10-17 MED ORDER — ORENCIA CLICKJECT 125 MG/ML ~~LOC~~ SOAJ: 125.0000 mg | SUBCUTANEOUS | 0 refills | Status: DC

## 2021-10-17 NOTE — Telephone Encounter (Signed)
Refill request received via fax  Next Visit: 12/08/2021  Last Visit: 10/06/2021  Last Fill:   [id number] rheumatoid arthritis   Current Dose per office note 10/06/2021: Orencia 125 mg subcutaneous injections once weekly  Labs: 10/06/2021 CMP WNL.  Anemia stable.   TB Gold: 04/13/2021 Neg    Okay to refill Orencia?

## 2021-11-01 ENCOUNTER — Ambulatory Visit (HOSPITAL_COMMUNITY): Payer: Medicare HMO

## 2021-11-15 ENCOUNTER — Encounter (HOSPITAL_COMMUNITY): Payer: Self-pay

## 2021-11-15 ENCOUNTER — Ambulatory Visit (HOSPITAL_COMMUNITY): Payer: Medicare HMO | Attending: Neurology

## 2021-12-08 ENCOUNTER — Ambulatory Visit: Payer: Medicare HMO | Admitting: Physician Assistant

## 2021-12-14 ENCOUNTER — Ambulatory Visit: Payer: Medicare HMO | Attending: Physician Assistant | Admitting: Physician Assistant

## 2021-12-21 NOTE — Progress Notes (Deleted)
Office Visit Note  Patient: Amanda Hammond             Date of Birth: 31-Jan-1971           MRN: [id number]             PCP: Suzan Slick, MD Referring: Suzan Slick, MD Visit Date: 01/04/2022 Occupation: @GUAROCC @  Subjective:  No chief complaint on file.   History of Present Illness: Amanda Hammond is a 51 y.o. female ***   Activities of Daily Living:  Patient reports morning stiffness for *** {minute/hour:19697}.   Patient {ACTIONS;DENIES/REPORTS:21021675::"Denies"} nocturnal pain.  Difficulty dressing/grooming: {ACTIONS;DENIES/REPORTS:21021675::"Denies"} Difficulty climbing stairs: {ACTIONS;DENIES/REPORTS:21021675::"Denies"} Difficulty getting out of chair: {ACTIONS;DENIES/REPORTS:21021675::"Denies"} Difficulty using hands for taps, buttons, cutlery, and/or writing: {ACTIONS;DENIES/REPORTS:21021675::"Denies"}  No Rheumatology ROS completed.   PMFS History:  Patient Active Problem List   Diagnosis Date Noted   Rheumatoid factor positive 11/06/2016   Trochanteric bursitis of both hips 11/06/2016   History of fibromyalgia 11/06/2016   DDD (degenerative disc disease), cervical 11/06/2016   Carpal tunnel syndrome, left upper limb 11/06/2016   Carpal tunnel syndrome, right upper limb 11/06/2016   Trigger finger, left middle finger 11/06/2016   Major depressive disorder, recurrent episode, moderate (HCC) 04/27/2016   Pain in left hand 04/25/2016   Pain in right hand 04/25/2016   Primary osteoarthritis of both hands 04/25/2016   Primary osteoarthritis of both knees 04/25/2016   Primary osteoarthritis of both feet 04/25/2016   Fibromyalgia 02/17/2016   Other insomnia 02/17/2016   Fatigue 02/17/2016   B12 DEFICIENCY 02/01/2010   Vitamin D deficiency 02/01/2010   ANEMIA, IRON DEFICIENCY 12/30/2009   OTHER DYSPNEA AND RESPIRATORY ABNORMALITIES 12/30/2009   Nonspecific (abnormal) findings on radiological and other examination of body structure 12/30/2009    COMPUTERIZED TOMOGRAPHY, CHEST, ABNORMAL 12/30/2009    Past Medical History:  Diagnosis Date   Anemia    Fibromyalgia    Hypertension    Osteoarthritis    PONV (postoperative nausea and vomiting)    Rheumatoid arthritis (HCC)    Vitamin D deficiency     Family History  Problem Relation Age of Onset   Diabetes Mother    Hypertension Mother    Hypertension Father    Hypertension Brother    Osteoarthritis Brother    Hypertension Brother    Osteoarthritis Brother    Endometriosis Daughter    Anesthesia problems Neg Hx    Hypotension Neg Hx    Pseudochol deficiency Neg Hx    Malignant hyperthermia Neg Hx    Past Surgical History:  Procedure Laterality Date   APPENDECTOMY  2010   [facility]   CARPAL TUNNEL RELEASE Left    CHOLECYSTECTOMY  1999   lap, [facility]   GASTRIC BYPASS  2003   [facility]   HERNIA REPAIR  08/2010, 02/2010    incisional, [facility], [facility]   INCISIONAL HERNIA REPAIR  10/12/2010   Procedure: HERNIA REPAIR INCISIONAL;  Surgeon: Dalia Heading;  Location: [facility];  Service: General;  Laterality: N/A;  Recurrent Incisional Hernia Repair with Mesh   KNEE SURGERY Right 01/2018   meniscal tear    KNEE SURGERY Right 12/2019   PANNICULECTOMY  2008   [facility]   SHOULDER SURGERY Left    TENNIS ELBOW RELEASE/NIRSCHEL PROCEDURE Bilateral    Social History   Social History Narrative   Not on file    There is no immunization history on file for this patient.   Objective: Vital Signs: There were no vitals  taken for this visit.   Physical Exam   Musculoskeletal Exam: ***  CDAI Exam: CDAI Score: -- Patient Global: --; Provider Global: -- Swollen: --; Tender: -- Joint Exam 01/04/2022   No joint exam has been documented for this visit   There is currently no information documented on the homunculus. Go to the Rheumatology activity and complete the homunculus joint exam.  Investigation: No additional findings.  Imaging: No results found.  Recent Labs: Lab Results   Component Value Date   WBC 7.1 10/06/2021   HGB 11.2 (L) 10/06/2021   PLT 367 10/06/2021   NA 135 10/06/2021   K 3.9 10/06/2021   CL 99 10/06/2021   CO2 29 10/06/2021   GLUCOSE 92 10/06/2021   BUN 14 10/06/2021   CREATININE 0.91 10/06/2021   BILITOT 0.3 10/06/2021   ALKPHOS 84 12/05/2009   AST 22 10/06/2021   ALT 23 10/06/2021   PROT 6.1 10/06/2021   ALBUMIN 3.1 (L) 12/05/2009   CALCIUM 8.9 10/06/2021   GFRAA 67 09/02/2020   QFTBGOLDPLUS NEGATIVE 04/13/2021    Speciality Comments: Orencia started 08/11/21  Procedures:  No procedures performed Allergies: Patient has no known allergies.   Assessment / Plan:     Visit Diagnoses: No diagnosis found.  Orders: No orders of the defined types were placed in this encounter.  No orders of the defined types were placed in this encounter.   Face-to-face time spent with patient was *** minutes. Greater than 50% of time was spent in counseling and coordination of care.  Follow-Up Instructions: No follow-ups on file.   Earnestine Mealing, CMA  Note - This record has been created using Editor, commissioning.  Chart creation errors have been sought, but may not always  have been located. Such creation errors do not reflect on  the standard of medical care.

## 2021-12-26 ENCOUNTER — Other Ambulatory Visit: Payer: Self-pay | Admitting: *Deleted

## 2021-12-26 DIAGNOSIS — M0600 Rheumatoid arthritis without rheumatoid factor, unspecified site: Secondary | ICD-10-CM

## 2021-12-26 DIAGNOSIS — Z79899 Other long term (current) drug therapy: Secondary | ICD-10-CM

## 2021-12-26 MED ORDER — ORENCIA CLICKJECT 125 MG/ML ~~LOC~~ SOAJ: 125.0000 mg | SUBCUTANEOUS | 0 refills | Status: DC

## 2021-12-26 NOTE — Telephone Encounter (Signed)
Refill request received via fax from [facility] for Amanda Hammond.  Next Visit: 01/04/2022  Last Visit: 10/06/2021  Last Fill: 10/17/2021  [id number] rheumatoid arthritis   Current Dose per office note 10/06/2021: Orencia 125 mg subcutaneous injections once weekly  Labs: 10/06/2021 CMP WNL.  Anemia stable.    TB Gold: 04/13/2021 Neg    Okay to refill Orencia?

## 2022-01-03 ENCOUNTER — Other Ambulatory Visit: Payer: Self-pay | Admitting: Physician Assistant

## 2022-01-03 NOTE — Telephone Encounter (Signed)
Next Visit: 01/04/2022   Last Visit: 10/06/2021   Last Fill: 10/07/2021  [id number] rheumatoid arthritis    Current Dose per office note 10/06/2021: Plaquenil 200 mg twice daily  Labs: 10/06/2021 CMP WNL.  Anemia stable.    PLQ Eye exam: not on file.   Left message to advise patient we need PLQ eye exam.   Okay to refill PLQ?

## 2022-01-03 NOTE — Telephone Encounter (Signed)
Please advise patient to have plaquenil eye exam sent to [place] prior to sending refill.

## 2022-01-04 ENCOUNTER — Ambulatory Visit: Payer: Medicare HMO | Admitting: Physician Assistant

## 2022-01-04 DIAGNOSIS — G4709 Other insomnia: Secondary | ICD-10-CM

## 2022-01-04 DIAGNOSIS — M62838 Other muscle spasm: Secondary | ICD-10-CM

## 2022-01-04 DIAGNOSIS — M170 Bilateral primary osteoarthritis of knee: Secondary | ICD-10-CM

## 2022-01-04 DIAGNOSIS — E559 Vitamin D deficiency, unspecified: Secondary | ICD-10-CM

## 2022-01-04 DIAGNOSIS — R5383 Other fatigue: Secondary | ICD-10-CM

## 2022-01-04 DIAGNOSIS — G5601 Carpal tunnel syndrome, right upper limb: Secondary | ICD-10-CM

## 2022-01-04 DIAGNOSIS — M5030 Other cervical disc degeneration, unspecified cervical region: Secondary | ICD-10-CM

## 2022-01-04 DIAGNOSIS — M7061 Trochanteric bursitis, right hip: Secondary | ICD-10-CM

## 2022-01-04 DIAGNOSIS — Z79899 Other long term (current) drug therapy: Secondary | ICD-10-CM

## 2022-01-04 DIAGNOSIS — M797 Fibromyalgia: Secondary | ICD-10-CM

## 2022-01-04 DIAGNOSIS — F331 Major depressive disorder, recurrent, moderate: Secondary | ICD-10-CM

## 2022-01-04 DIAGNOSIS — M0600 Rheumatoid arthritis without rheumatoid factor, unspecified site: Secondary | ICD-10-CM

## 2022-01-04 DIAGNOSIS — M25461 Effusion, right knee: Secondary | ICD-10-CM

## 2022-01-04 DIAGNOSIS — M19071 Primary osteoarthritis, right ankle and foot: Secondary | ICD-10-CM

## 2022-01-04 DIAGNOSIS — M19042 Primary osteoarthritis, left hand: Secondary | ICD-10-CM

## 2022-01-06 ENCOUNTER — Ambulatory Visit (HOSPITAL_COMMUNITY)
Admission: RE | Admit: 2022-01-06 | Discharge: 2022-01-06 | Disposition: A | Discharge status: Home / Self Care | Payer: Medicare HMO | Source: Ambulatory Visit | Attending: Neurology | Admitting: Neurology

## 2022-01-06 DIAGNOSIS — M5010 Cervical disc disorder with radiculopathy, unspecified cervical region: Secondary | ICD-10-CM | POA: Insufficient documentation

## 2022-01-12 NOTE — Progress Notes (Deleted)
Office Visit Note  Patient: Amanda Hammond             Date of Birth: 05-10-1970           MRN: [id number]             PCP: Suzan Slick, MD Referring: No ref. provider found Visit Date: 01/24/2022 Occupation: @GUAROCC @  Subjective:    History of Present Illness: Amanda Hammond is a 51 y.o. female with history of seropositive rheumatoid arthritis and osteoarthritis.   CBC and CMP updated on 10/06/21. TB gold negative on 04/13/21.   Activities of Daily Living:  Patient reports morning stiffness for *** {minute/hour:19697}.   Patient {ACTIONS;DENIES/REPORTS:21021675::"Denies"} nocturnal pain.  Difficulty dressing/grooming: {ACTIONS;DENIES/REPORTS:21021675::"Denies"} Difficulty climbing stairs: {ACTIONS;DENIES/REPORTS:21021675::"Denies"} Difficulty getting out of chair: {ACTIONS;DENIES/REPORTS:21021675::"Denies"} Difficulty using hands for taps, buttons, cutlery, and/or writing: {ACTIONS;DENIES/REPORTS:21021675::"Denies"}  No Rheumatology ROS completed.   PMFS History:  Patient Active Problem List   Diagnosis Date Noted   Rheumatoid factor positive 11/06/2016   Trochanteric bursitis of both hips 11/06/2016   History of fibromyalgia 11/06/2016   DDD (degenerative disc disease), cervical 11/06/2016   Carpal tunnel syndrome, left upper limb 11/06/2016   Carpal tunnel syndrome, right upper limb 11/06/2016   Trigger finger, left middle finger 11/06/2016   Major depressive disorder, recurrent episode, moderate (HCC) 04/27/2016   Pain in left hand 04/25/2016   Pain in right hand 04/25/2016   Primary osteoarthritis of both hands 04/25/2016   Primary osteoarthritis of both knees 04/25/2016   Primary osteoarthritis of both feet 04/25/2016   Fibromyalgia 02/17/2016   Other insomnia 02/17/2016   Fatigue 02/17/2016   B12 DEFICIENCY 02/01/2010   Vitamin D deficiency 02/01/2010   ANEMIA, IRON DEFICIENCY 12/30/2009   OTHER DYSPNEA AND RESPIRATORY ABNORMALITIES 12/30/2009    Nonspecific (abnormal) findings on radiological and other examination of body structure 12/30/2009   COMPUTERIZED TOMOGRAPHY, CHEST, ABNORMAL 12/30/2009    Past Medical History:  Diagnosis Date   Anemia    Fibromyalgia    Hypertension    Osteoarthritis    PONV (postoperative nausea and vomiting)    Rheumatoid arthritis (HCC)    Vitamin D deficiency     Family History  Problem Relation Age of Onset   Diabetes Mother    Hypertension Mother    Hypertension Father    Hypertension Brother    Osteoarthritis Brother    Hypertension Brother    Osteoarthritis Brother    Endometriosis Daughter    Anesthesia problems Neg Hx    Hypotension Neg Hx    Pseudochol deficiency Neg Hx    Malignant hyperthermia Neg Hx    Past Surgical History:  Procedure Laterality Date   APPENDECTOMY  2010   [facility]   CARPAL TUNNEL RELEASE Left    CHOLECYSTECTOMY  1999   lap, [facility]   GASTRIC BYPASS  2003   [facility]   HERNIA REPAIR  08/2010, 02/2010    incisional, [facility], [facility]   INCISIONAL HERNIA REPAIR  10/12/2010   Procedure: HERNIA REPAIR INCISIONAL;  Surgeon: 12/13/2010;  Location: [facility];  Service: General;  Laterality: N/A;  Recurrent Incisional Hernia Repair with Mesh   KNEE SURGERY Right 01/2018   meniscal tear    KNEE SURGERY Right 12/2019   PANNICULECTOMY  2008   [facility]   SHOULDER SURGERY Left    TENNIS ELBOW RELEASE/NIRSCHEL PROCEDURE Bilateral    Social History   Social History Narrative   Not on file    There is no immunization  history on file for this patient.   Objective: Vital Signs: There were no vitals taken for this visit.   Physical Exam Vitals and nursing note reviewed.  Constitutional:      Appearance: She is well-developed.  HENT:     Head: Normocephalic and atraumatic.  Eyes:     Conjunctiva/sclera: Conjunctivae normal.  Cardiovascular:     Rate and Rhythm: Normal rate and regular rhythm.     Heart sounds: Normal heart sounds.  Pulmonary:     Effort: Pulmonary effort  is normal.     Breath sounds: Normal breath sounds.  Abdominal:     General: Bowel sounds are normal.     Palpations: Abdomen is soft.  Musculoskeletal:     Cervical back: Normal range of motion.  Lymphadenopathy:     Cervical: No cervical adenopathy.  Skin:    General: Skin is warm and dry.     Capillary Refill: Capillary refill takes less than 2 seconds.  Neurological:     Mental Status: She is alert and oriented to person, place, and time.  Psychiatric:        Behavior: Behavior normal.      Musculoskeletal Exam: ***  CDAI Exam: CDAI Score: -- Patient Global: --; Provider Global: -- Swollen: --; Tender: -- Joint Exam 01/24/2022   No joint exam has been documented for this visit   There is currently no information documented on the homunculus. Go to the Rheumatology activity and complete the homunculus joint exam.  Investigation: No additional findings.  Imaging: MR CERVICAL SPINE WO CONTRAST  Result Date: 01/07/2022 CLINICAL DATA:  Initial evaluation for chronic neck and shoulder pain, with weakness and numbness in hands for 1 year. EXAM: MRI CERVICAL SPINE WITHOUT CONTRAST TECHNIQUE: Multiplanar, multisequence MR imaging of the cervical spine was performed. No intravenous contrast was administered. COMPARISON:  None available. FINDINGS: Alignment: Examination is somewhat technically limited as and axial GRE sequence is not available for review at time of this dictation. Reversal of the normal cervical lordosis, with apex at C5-6. Trace degenerative anterolisthesis of C5 on C6 noted as well. Vertebrae: Vertebral body height maintained without acute or chronic fracture. Bone marrow signal intensity within normal limits. No worrisome osseous lesions or abnormal marrow edema. Cord: Normal signal and morphology. Posterior Fossa, vertebral arteries, paraspinal tissues: Unremarkable. Disc levels: C2-C3: Unremarkable. C3-C4: Negative interspace. Minimal facet hypertrophy. No canal or  foraminal stenosis. C4-C5: Minimal annular disc bulge with left-sided uncovertebral spurring. No spinal stenosis. Mild left C5 foraminal narrowing. Right neural foramen remains patent. C5-C6: Degenerative intervertebral disc space narrowing with mild disc bulge. Left greater than right uncovertebral spurring. Flattening of the ventral thecal sac without significant spinal stenosis. Mild to moderate left C6 foraminal narrowing. Right neural foramen is patent. C6-C7: Mild degenerative intervertebral disc space narrowing. Mild disc bulge with endplate spurring. No spinal stenosis. Foramina remain patent. C7-T1:  Unremarkable. IMPRESSION: 1. Mild for age cervical spondylosis without significant spinal stenosis or overt neural impingement. 2. Mild to moderate left C5 and C6 foraminal narrowing related to disc bulge and uncovertebral disease. No other significant foraminal encroachment within the cervical spine. Electronically Signed   By: Jeannine Boga M.D.   On: 01/07/2022 05:46    Recent Labs: Lab Results  Component Value Date   WBC 7.1 10/06/2021   HGB 11.2 (L) 10/06/2021   PLT 367 10/06/2021   NA 135 10/06/2021   K 3.9 10/06/2021   CL 99 10/06/2021   CO2 29 10/06/2021  GLUCOSE 92 10/06/2021   BUN 14 10/06/2021   CREATININE 0.91 10/06/2021   BILITOT 0.3 10/06/2021   ALKPHOS 84 12/05/2009   AST 22 10/06/2021   ALT 23 10/06/2021   PROT 6.1 10/06/2021   ALBUMIN 3.1 (L) 12/05/2009   CALCIUM 8.9 10/06/2021   GFRAA 67 09/02/2020   QFTBGOLDPLUS NEGATIVE 04/13/2021    Speciality Comments: Orencia started 08/11/21  Procedures:  No procedures performed Allergies: Patient has no known allergies.   Assessment / Plan:     Visit Diagnoses: Seronegative rheumatoid arthritis (HCC)  High risk medication use  Primary osteoarthritis of both hands  Carpal tunnel syndrome, right upper limb  Trochanteric bursitis of both hips  Primary osteoarthritis of both knees  Effusion, right  knee  Primary osteoarthritis of both feet  DDD (degenerative disc disease), cervical  Fibromyalgia  Other insomnia  Other fatigue  Trapezius muscle spasm  Vitamin D deficiency  Major depressive disorder, recurrent episode, moderate (HCC)  Orders: No orders of the defined types were placed in this encounter.  No orders of the defined types were placed in this encounter.   Face-to-face time spent with patient was *** minutes. Greater than 50% of time was spent in counseling and coordination of care.  Follow-Up Instructions: No follow-ups on file.   Gearldine Bienenstock, PA-C  Note - This record has been created using Dragon software.  Chart creation errors have been sought, but may not always  have been located. Such creation errors do not reflect on  the standard of medical care.

## 2022-01-24 ENCOUNTER — Ambulatory Visit: Payer: Medicare HMO | Admitting: Physician Assistant

## 2022-01-24 DIAGNOSIS — M19042 Primary osteoarthritis, left hand: Secondary | ICD-10-CM

## 2022-01-24 DIAGNOSIS — M25461 Effusion, right knee: Secondary | ICD-10-CM

## 2022-01-24 DIAGNOSIS — M5030 Other cervical disc degeneration, unspecified cervical region: Secondary | ICD-10-CM

## 2022-01-24 DIAGNOSIS — M797 Fibromyalgia: Secondary | ICD-10-CM

## 2022-01-24 DIAGNOSIS — G5601 Carpal tunnel syndrome, right upper limb: Secondary | ICD-10-CM

## 2022-01-24 DIAGNOSIS — M0600 Rheumatoid arthritis without rheumatoid factor, unspecified site: Secondary | ICD-10-CM

## 2022-01-24 DIAGNOSIS — M62838 Other muscle spasm: Secondary | ICD-10-CM

## 2022-01-24 DIAGNOSIS — R5383 Other fatigue: Secondary | ICD-10-CM

## 2022-01-24 DIAGNOSIS — Z79899 Other long term (current) drug therapy: Secondary | ICD-10-CM

## 2022-01-24 DIAGNOSIS — M7061 Trochanteric bursitis, right hip: Secondary | ICD-10-CM

## 2022-01-24 DIAGNOSIS — M19071 Primary osteoarthritis, right ankle and foot: Secondary | ICD-10-CM

## 2022-01-24 DIAGNOSIS — E559 Vitamin D deficiency, unspecified: Secondary | ICD-10-CM

## 2022-01-24 DIAGNOSIS — G4709 Other insomnia: Secondary | ICD-10-CM

## 2022-01-24 DIAGNOSIS — M170 Bilateral primary osteoarthritis of knee: Secondary | ICD-10-CM

## 2022-01-24 DIAGNOSIS — F331 Major depressive disorder, recurrent, moderate: Secondary | ICD-10-CM

## 2022-02-06 NOTE — Progress Notes (Deleted)
Office Visit Note  Patient: Amanda Hammond             Date of Birth: Feb 16, 1971           MRN: [id number]             PCP: Suzan Slick, MD Referring: No ref. provider found Visit Date: 02/08/2022 Occupation: @GUAROCC @  Subjective:    History of Present Illness: Amanda Hammond is a 51 y.o. female with history of rheumatoid arthritis and osteoarthritis.   CBC and CMP updated on 10/06/21.   Activities of Daily Living:  Patient reports morning stiffness for all day. Patient Reports nocturnal pain.  Difficulty dressing/grooming: Denies Difficulty climbing stairs: Reports Difficulty getting out of chair: Reports Difficulty using hands for taps, buttons, cutlery, and/or writing: Reports  Review of Systems  Constitutional:  Positive for fatigue.  HENT:  Positive for mouth dryness. Negative for mouth sores.   Eyes:  Negative for dryness.  Respiratory:  Positive for shortness of breath.   Cardiovascular:  Negative for chest pain and palpitations.  Gastrointestinal:  Positive for constipation and diarrhea. Negative for blood in stool.  Endocrine: Negative for increased urination.  Genitourinary:  Negative for involuntary urination.  Musculoskeletal:  Positive for joint pain, gait problem, joint pain, joint swelling, myalgias, morning stiffness, muscle tenderness and myalgias. Negative for muscle weakness.  Skin:  Negative for color change, rash, hair loss and sensitivity to sunlight.  Allergic/Immunologic: Negative for susceptible to infections.  Neurological:  Positive for dizziness and headaches.  Hematological:  Negative for swollen glands.  Psychiatric/Behavioral:  Positive for sleep disturbance. Negative for depressed mood. The patient is not nervous/anxious.     PMFS History:  Patient Active Problem List   Diagnosis Date Noted   Rheumatoid factor positive 11/06/2016   Trochanteric bursitis of both hips 11/06/2016   History of fibromyalgia 11/06/2016   DDD  (degenerative disc disease), cervical 11/06/2016   Carpal tunnel syndrome, left upper limb 11/06/2016   Carpal tunnel syndrome, right upper limb 11/06/2016   Trigger finger, left middle finger 11/06/2016   Major depressive disorder, recurrent episode, moderate (HCC) 04/27/2016   Pain in left hand 04/25/2016   Pain in right hand 04/25/2016   Primary osteoarthritis of both hands 04/25/2016   Primary osteoarthritis of both knees 04/25/2016   Primary osteoarthritis of both feet 04/25/2016   Fibromyalgia 02/17/2016   Other insomnia 02/17/2016   Fatigue 02/17/2016   B12 DEFICIENCY 02/01/2010   Vitamin D deficiency 02/01/2010   ANEMIA, IRON DEFICIENCY 12/30/2009   OTHER DYSPNEA AND RESPIRATORY ABNORMALITIES 12/30/2009   Nonspecific (abnormal) findings on radiological and other examination of body structure 12/30/2009   COMPUTERIZED TOMOGRAPHY, CHEST, ABNORMAL 12/30/2009    Past Medical History:  Diagnosis Date   Anemia    Fibromyalgia    Hypertension    Osteoarthritis    PONV (postoperative nausea and vomiting)    Rheumatoid arthritis (HCC)    Vitamin D deficiency     Family History  Problem Relation Age of Onset   Diabetes Mother    Hypertension Mother    Hypertension Father    Hypertension Brother    Osteoarthritis Brother    Hypertension Brother    Osteoarthritis Brother    Endometriosis Daughter    Anesthesia problems Neg Hx    Hypotension Neg Hx    Pseudochol deficiency Neg Hx    Malignant hyperthermia Neg Hx    Past Surgical History:  Procedure Laterality Date   APPENDECTOMY  2010   [facility]   CARPAL TUNNEL RELEASE Left    CHOLECYSTECTOMY  1999   lap, [facility]   GASTRIC BYPASS  2003   [facility]   HERNIA REPAIR  08/2010, 02/2010    incisional, [facility], [facility]   INCISIONAL HERNIA REPAIR  10/12/2010   Procedure: HERNIA REPAIR INCISIONAL;  Surgeon: Dalia Heading;  Location: [facility];  Service: General;  Laterality: N/A;  Recurrent Incisional Hernia Repair with Mesh   KNEE SURGERY Right  01/2018   meniscal tear    KNEE SURGERY Right 12/2019   PANNICULECTOMY  2008   [facility]   SHOULDER SURGERY Left    TENNIS ELBOW RELEASE/NIRSCHEL PROCEDURE Bilateral    Social History   Social History Narrative   Not on file    There is no immunization history on file for this patient.   Objective: Vital Signs: There were no vitals taken for this visit.   Physical Exam Vitals and nursing note reviewed.  Constitutional:      Appearance: She is well-developed.  HENT:     Head: Normocephalic and atraumatic.  Eyes:     Conjunctiva/sclera: Conjunctivae normal.  Cardiovascular:     Rate and Rhythm: Normal rate and regular rhythm.     Heart sounds: Normal heart sounds.  Pulmonary:     Effort: Pulmonary effort is normal.     Breath sounds: Normal breath sounds.  Abdominal:     General: Bowel sounds are normal.     Palpations: Abdomen is soft.  Musculoskeletal:     Cervical back: Normal range of motion.  Skin:    General: Skin is warm and dry.     Capillary Refill: Capillary refill takes less than 2 seconds.  Neurological:     Mental Status: She is alert and oriented to person, place, and time.  Psychiatric:        Behavior: Behavior normal.      Musculoskeletal Exam: ***  CDAI Exam: CDAI Score: -- Patient Global: --; Provider Global: -- Swollen: --; Tender: -- Joint Exam 02/08/2022   No joint exam has been documented for this visit   There is currently no information documented on the homunculus. Go to the Rheumatology activity and complete the homunculus joint exam.  Investigation: No additional findings.  Imaging: No results found.  Recent Labs: Lab Results  Component Value Date   WBC 7.1 10/06/2021   HGB 11.2 (L) 10/06/2021   PLT 367 10/06/2021   NA 135 10/06/2021   K 3.9 10/06/2021   CL 99 10/06/2021   CO2 29 10/06/2021   GLUCOSE 92 10/06/2021   BUN 14 10/06/2021   CREATININE 0.91 10/06/2021   BILITOT 0.3 10/06/2021   ALKPHOS 84 12/05/2009    AST 22 10/06/2021   ALT 23 10/06/2021   PROT 6.1 10/06/2021   ALBUMIN 3.1 (L) 12/05/2009   CALCIUM 8.9 10/06/2021   GFRAA 67 09/02/2020   QFTBGOLDPLUS NEGATIVE 04/13/2021    Speciality Comments: Orencia started 08/11/21  Procedures:  No procedures performed Allergies: Patient has no known allergies.   Assessment / Plan:     Visit Diagnoses: Seronegative rheumatoid arthritis (HCC)  High risk medication use  Primary osteoarthritis of both hands  Carpal tunnel syndrome, right upper limb  Trochanteric bursitis of both hips  Primary osteoarthritis of both knees  Effusion, right knee  Primary osteoarthritis of both feet  DDD (degenerative disc disease), cervical  Fibromyalgia  Other insomnia  Other fatigue  Vitamin D deficiency  Major depressive disorder, recurrent episode,  moderate (HCC)  Trapezius muscle spasm  Orders: No orders of the defined types were placed in this encounter.  No orders of the defined types were placed in this encounter.   Face-to-face time spent with patient was *** minutes. Greater than 50% of time was spent in counseling and coordination of care.  Follow-Up Instructions: No follow-ups on file.   Ofilia Neas, PA-C  Note - This record has been created using Dragon software.  Chart creation errors have been sought, but may not always  have been located. Such creation errors do not reflect on  the standard of medical care.

## 2022-02-08 ENCOUNTER — Encounter: Payer: Self-pay | Admitting: Physician Assistant

## 2022-02-08 ENCOUNTER — Ambulatory Visit: Payer: Medicare HMO | Admitting: Physician Assistant

## 2022-02-08 ENCOUNTER — Telehealth: Payer: Self-pay

## 2022-02-08 VITALS — BP 84/52 | HR 78 | Resp 18 | Ht 65.0 in | Wt 242.6 lb

## 2022-02-08 DIAGNOSIS — M0600 Rheumatoid arthritis without rheumatoid factor, unspecified site: Secondary | ICD-10-CM

## 2022-02-08 DIAGNOSIS — R5383 Other fatigue: Secondary | ICD-10-CM

## 2022-02-08 DIAGNOSIS — M25461 Effusion, right knee: Secondary | ICD-10-CM

## 2022-02-08 DIAGNOSIS — M170 Bilateral primary osteoarthritis of knee: Secondary | ICD-10-CM

## 2022-02-08 DIAGNOSIS — M7061 Trochanteric bursitis, right hip: Secondary | ICD-10-CM

## 2022-02-08 DIAGNOSIS — F331 Major depressive disorder, recurrent, moderate: Secondary | ICD-10-CM

## 2022-02-08 DIAGNOSIS — G5601 Carpal tunnel syndrome, right upper limb: Secondary | ICD-10-CM

## 2022-02-08 DIAGNOSIS — M5030 Other cervical disc degeneration, unspecified cervical region: Secondary | ICD-10-CM

## 2022-02-08 DIAGNOSIS — M19041 Primary osteoarthritis, right hand: Secondary | ICD-10-CM

## 2022-02-08 DIAGNOSIS — M797 Fibromyalgia: Secondary | ICD-10-CM

## 2022-02-08 DIAGNOSIS — E559 Vitamin D deficiency, unspecified: Secondary | ICD-10-CM

## 2022-02-08 DIAGNOSIS — Z79899 Other long term (current) drug therapy: Secondary | ICD-10-CM

## 2022-02-08 DIAGNOSIS — G4709 Other insomnia: Secondary | ICD-10-CM

## 2022-02-08 DIAGNOSIS — M62838 Other muscle spasm: Secondary | ICD-10-CM

## 2022-02-08 DIAGNOSIS — M19071 Primary osteoarthritis, right ankle and foot: Secondary | ICD-10-CM

## 2022-02-08 NOTE — Telephone Encounter (Signed)
When I entered the room the patient was diaphoretic and had facial flushing.  She reported feeling as though she was going to "fall out" and was nauseous.  She drank half a cup of water and tried eating a peanut butter cracker but had no improvement in her symptoms.  Of note the patient reports having pneumonia last week. She is no longer on antibiotics.  She has been trying to stay hydrated. She reports that her daughter is getting married tomorrow and she feels that her symptoms are all due to her "nerves."  Patient was advised to go straight to the emergency department.  Offered to call an ambulance but she declined. I wheeled the patient out in a wheelchair to her daughter's car.  We advised the daughter to take Alexandrya directly to the emergency department for further evaluation.

## 2022-02-08 NOTE — Telephone Encounter (Signed)
Patient presented in the office today for a follow up appointment and suddenly felt ill. Patient reports excessive sweating, feeling faint and nauseated.  Her blood pressure was:  89/59 pulse:84 85/57 pulse: 85 84/52 pulse: 78

## 2022-02-09 ENCOUNTER — Telehealth: Payer: Self-pay | Admitting: Pharmacist

## 2022-02-09 NOTE — Telephone Encounter (Signed)
Received fax from [facility] requesting completion of PAP 2024 renewal application for Orencia. Patient porton mailed to her home today with reminder of income document requirement.    Provider portion placed in Sherron Ales, PA-C's, folder for signature.   Case # [id number]   Chesley Mires, PharmD, MPH, BCPS, CPP Clinical Pharmacist (Rheumatology and Pulmonology)

## 2022-02-13 NOTE — Telephone Encounter (Signed)
Received signed provider portion from Dr. Corliss Skains. Pending return of patient portion (mailed 11/9).  Georgeann Oppenheim [employer] PharmD Candidate [phone]

## 2022-02-15 NOTE — Progress Notes (Signed)
This encounter was created in error - please disregard.

## 2022-02-20 ENCOUNTER — Other Ambulatory Visit: Payer: Self-pay | Admitting: Rheumatology

## 2022-02-20 NOTE — Telephone Encounter (Signed)
Patient requested prescription refills of Hydroxychloroquine and Leflunomide to be sent to [facility] at [address]  This is a NEW PHARMACY for her Leflunomide.    Patient scheduled follow-up appointment for  Wednesday, 03/29/22 at 8:20 am with Ladona Ridgel.

## 2022-02-20 NOTE — Telephone Encounter (Signed)
Next Visit: 03/29/2022  Last Visit: 10/06/2021  Labs: 02/15/2022 Hgb 10.1, Hct 32.0, MCV 76.2, MCH 24.0, MCHC 31.6, RDW 16.7, Sodium 133, Chloride 96  Eye exam: not on file.   Current Dose per office note 10/06/2021: Plaquenil 200 mg twice daily Arava 20 mg daily   DX: Seronegative rheumatoid arthritis   Last Fill: 08/10/2021 Ranae Plumber), 10/07/2021 (PLQ)  Patient advised we need her PLQ eye exam. Patient will schedule and then advise [place] of when it scheduled for.   Okay to refill Plaquenil?

## 2022-02-21 MED ORDER — HYDROXYCHLOROQUINE SULFATE 200 MG PO TABS: 200.0000 mg | ORAL_TABLET | Freq: Two times a day (BID) | ORAL | 2 refills | Status: DC

## 2022-02-21 MED ORDER — LEFLUNOMIDE 20 MG PO TABS: 20.0000 mg | ORAL_TABLET | Freq: Every day | ORAL | 0 refills | Status: DC

## 2022-02-27 ENCOUNTER — Other Ambulatory Visit: Payer: Self-pay | Admitting: *Deleted

## 2022-02-27 DIAGNOSIS — Z79899 Other long term (current) drug therapy: Secondary | ICD-10-CM

## 2022-02-27 DIAGNOSIS — M0600 Rheumatoid arthritis without rheumatoid factor, unspecified site: Secondary | ICD-10-CM

## 2022-02-27 MED ORDER — ORENCIA CLICKJECT 125 MG/ML ~~LOC~~ SOAJ: 125.0000 mg | SUBCUTANEOUS | 0 refills | Status: DC

## 2022-02-27 NOTE — Telephone Encounter (Signed)
Patient states she did not take her injections while she has pneumonia. She states she has been cleared by her PCP. Patient restarted her injection on 02/23/2022.

## 2022-02-27 NOTE — Telephone Encounter (Signed)
Please clarify if she has been taking orencia? She recently was diagnosed with pneumonia.

## 2022-02-27 NOTE — Telephone Encounter (Signed)
Verified with patient that she received mailed PAP application. Patient states she will look at it now and mail it back once she has completed it.  Amanda Hammond [employer] PharmD Candidate [phone]

## 2022-02-27 NOTE — Telephone Encounter (Signed)
Refill request received via fax from [facility] for Orencia   Next Visit: 03/29/2022   Last Visit: 10/06/2021   Labs: 02/15/2022 Hgb 10.1, Hct 32.0, MCV 76.2, MCH 24.0, MCHC 31.6, RDW 16.7, Sodium 133, Chloride 96  TB Gold: 04/13/2021 Neg   Current Dose per office note on 10/06/2021: Orencia 125 mg subcutaneous injections once weekly   Dx: Seronegative rheumatoid arthritis   Okay to refill Orencia?

## 2022-03-07 NOTE — Telephone Encounter (Signed)
Submitted Patient Assistance RENEWAL Application to [employer] for [facility] along with provider portion, patient portion, med list, insurance card copy, and PA. Will update patient when we receive a response.  Fax# [phone] Phone# [phone] Case # [id number]  Chesley Mires, PharmD, MPH, BCPS, CPP Clinical Pharmacist (Rheumatology and Pulmonology)

## 2022-03-13 ENCOUNTER — Ambulatory Visit: Payer: Medicare HMO | Admitting: Orthopedic Surgery

## 2022-03-15 NOTE — Progress Notes (Signed)
Sent message, via epic in basket, requesting orders in epic from surgeon.  

## 2022-03-16 ENCOUNTER — Ambulatory Visit: Payer: Self-pay | Admitting: Physician Assistant

## 2022-03-16 DIAGNOSIS — G8929 Other chronic pain: Secondary | ICD-10-CM

## 2022-03-16 NOTE — H&P (Signed)
TOTAL KNEE ADMISSION H&P  Patient is being admitted for right total knee arthroplasty.  Subjective:  Chief Complaint:right knee pain.  HPI: Amanda Hammond, 50 y.o. female, has a history of pain and functional disability in the right knee due to arthritis and has failed non-surgical conservative treatments for greater than 12 weeks to includeNSAID's and/or analgesics, corticosteriod injections, viscosupplementation injections, use of assistive devices, and activity modification.  Onset of symptoms was gradual, starting 5 years ago with gradually worsening course since that time. The patient noted no past surgery on the right knee(s).  Patient currently rates pain in the right knee(s) at 8 out of 10 with activity. Patient has night pain, worsening of pain with activity and weight bearing, pain that interferes with activities of daily living, pain with passive range of motion, crepitus, and joint swelling.  Patient has evidence of periarticular osteophytes and joint space narrowing by imaging studies. There is no active infection.  Patient Active Problem List   Diagnosis Date Noted  . Rheumatoid factor positive 11/06/2016  . Trochanteric bursitis of both hips 11/06/2016  . History of fibromyalgia 11/06/2016  . DDD (degenerative disc disease), cervical 11/06/2016  . Carpal tunnel syndrome, left upper limb 11/06/2016  . Carpal tunnel syndrome, right upper limb 11/06/2016  . Trigger finger, left middle finger 11/06/2016  . Major depressive disorder, recurrent episode, moderate (HCC) 04/27/2016  . Pain in left hand 04/25/2016  . Pain in right hand 04/25/2016  . Primary osteoarthritis of both hands 04/25/2016  . Primary osteoarthritis of both knees 04/25/2016  . Primary osteoarthritis of both feet 04/25/2016  . Fibromyalgia 02/17/2016  . Other insomnia 02/17/2016  . Fatigue 02/17/2016  . B12 DEFICIENCY 02/01/2010  . Vitamin D deficiency 02/01/2010  . ANEMIA, IRON DEFICIENCY 12/30/2009  .  OTHER DYSPNEA AND RESPIRATORY ABNORMALITIES 12/30/2009  . Nonspecific (abnormal) findings on radiological and other examination of body structure 12/30/2009  . COMPUTERIZED TOMOGRAPHY, CHEST, ABNORMAL 12/30/2009   Past Medical History:  Diagnosis Date  . Anemia   . Fibromyalgia   . Hypertension   . Osteoarthritis   . PONV (postoperative nausea and vomiting)   . Rheumatoid arthritis (HCC)   . Vitamin D deficiency     Past Surgical History:  Procedure Laterality Date  . APPENDECTOMY  2010   [facility]  . CARPAL TUNNEL RELEASE Left   . CHOLECYSTECTOMY  1999   lap, [facility]  . GASTRIC BYPASS  2003   [facility]  . HERNIA REPAIR  08/2010, 02/2010    incisional, [facility], [facility]  . INCISIONAL HERNIA REPAIR  10/12/2010   Procedure: HERNIA REPAIR INCISIONAL;  Surgeon: Mark A Jenkins;  Location: [facility];  Service: General;  Laterality: N/A;  Recurrent Incisional Hernia Repair with Mesh  . KNEE SURGERY Right 01/2018   meniscal tear   . KNEE SURGERY Right 12/2019  . PANNICULECTOMY  2008   [facility]  . SHOULDER SURGERY Left   . TENNIS ELBOW RELEASE/NIRSCHEL PROCEDURE Bilateral     Current Outpatient Medications  Medication Sig Dispense Refill Last Dose  . Abatacept (ORENCIA CLICKJECT) 125 MG/ML SOAJ Inject 125 mg into the skin once a week. 12 mL 0   . buPROPion (WELLBUTRIN XL) 300 MG 24 hr tablet Take 300 mg by mouth daily.      . DULoxetine (CYMBALTA) 30 MG capsule Take by mouth at bedtime.      . DULoxetine (CYMBALTA) 60 MG capsule Take 60 mg by mouth daily.  2   . hydroxychloroquine (PLAQUENIL) 200 MG   tablet Take 1 tablet (200 mg total) by mouth 2 (two) times daily. 60 tablet 2   . hydrOXYzine (ATARAX/VISTARIL) 50 MG tablet hydroxyzine HCl 50 mg tablet  TAKE 1 TABLET BY MOUTH EVERY 8 HOURS AS NEEDED FOR ANXIETY     . imipramine (TOFRANIL) 50 MG tablet Take 100 mg by mouth at bedtime.     . leflunomide (ARAVA) 20 MG tablet Take 1 tablet (20 mg total) by mouth daily. 90 tablet 0   . lidocaine (LIDODERM) 5 % Place  1 patch onto the skin daily. Per pt, as needed (Patient not taking: Reported on 02/08/2022)     . lisinopril-hydrochlorothiazide (PRINZIDE,ZESTORETIC) 10-12.5 MG tablet Take 1 tablet by mouth daily.     . oxyCODONE-acetaminophen (PERCOCET) 10-325 MG tablet TAKE 1 TABLET BY MOUTH EVERY SIX hours AS NEEDED FOR pain     . predniSONE (DELTASONE) 5 MG tablet Take 4 tablets by mouth daily x4 days, 3 tablets daily x4 days, 2 tablets daily x4 days, 1 tablet daily x4 days. (Patient not taking: Reported on 08/10/2021) 40 tablet 0   . predniSONE (DELTASONE) 5 MG tablet Take 4 tabs po x 7 days, 3  tabs po x 7 days, 2  tabs po x 7 days, 1  tab po x 7 days (Patient not taking: Reported on 08/10/2021) 70 tablet 0   . pregabalin (LYRICA) 200 MG capsule Take 200 mg by mouth 3 (three) times daily.     . Rimegepant Sulfate (NURTEC) 75 MG TBDP as needed.     . rOPINIRole (REQUIP) 0.25 MG tablet ropinirole 0.25 mg tablet  Take 1 tablet twice a day by oral route.     . tiZANidine (ZANAFLEX) 4 MG tablet Take 2-4 mg by mouth every 8 (eight) hours.      . topiramate (TOPAMAX) 50 MG tablet Take 100 mg by mouth at bedtime.     . traMADol (ULTRAM) 50 MG tablet Take 1 tablet by mouth 4 (four) times daily as needed.      No current facility-administered medications for this visit.   No Known Allergies  Social History   Tobacco Use  . Smoking status: Never    Passive exposure: Never  . Smokeless tobacco: Never  Substance Use Topics  . Alcohol use: No    Family History  Problem Relation Age of Onset  . Diabetes Mother   . Hypertension Mother   . Hypertension Father   . Hypertension Brother   . Osteoarthritis Brother   . Hypertension Brother   . Osteoarthritis Brother   . Endometriosis Daughter   . Anesthesia problems Neg Hx   . Hypotension Neg Hx   . Pseudochol deficiency Neg Hx   . Malignant hyperthermia Neg Hx      Review of Systems  HENT:  Positive for nosebleeds.   Gastrointestinal:  Positive for  constipation, diarrhea, nausea and vomiting.  Genitourinary:  Positive for dysuria and frequency.  Musculoskeletal:  Positive for arthralgias.  Neurological:  Positive for dizziness and headaches.  Psychiatric/Behavioral:  The patient is nervous/anxious.   All other systems reviewed and are negative.  Objective:  Physical Exam Constitutional:      General: She is not in acute distress.    Appearance: Normal appearance.  HENT:     Head: Normocephalic and atraumatic.  Eyes:     Extraocular Movements: Extraocular movements intact.     Pupils: Pupils are equal, round, and reactive to light.  Cardiovascular:     Rate and Rhythm:   Normal rate and regular rhythm.     Pulses: Normal pulses.     Heart sounds: Normal heart sounds.  Pulmonary:     Effort: Pulmonary effort is normal. No respiratory distress.     Breath sounds: Normal breath sounds. No wheezing.  Abdominal:     General: Abdomen is flat. Bowel sounds are normal. There is no distension.     Palpations: Abdomen is soft.     Tenderness: There is no abdominal tenderness.  Musculoskeletal:     Cervical back: Normal range of motion and neck supple.     Right knee: Swelling and bony tenderness present. No erythema. Normal range of motion. Tenderness present.  Lymphadenopathy:     Cervical: No cervical adenopathy.  Skin:    General: Skin is warm and dry.     Findings: No erythema or rash.  Neurological:     General: No focal deficit present.     Mental Status: She is alert and oriented to person, place, and time.  Psychiatric:        Mood and Affect: Mood normal.        Behavior: Behavior normal.   Vital signs in last 24 hours: @VSRANGES @  Labs:   Estimated body mass index is 40.37 kg/m as calculated from the following:   Height as of 02/08/22: 5\' 5"  (1.651 m).   Weight as of 02/08/22: 110 kg.   Imaging Review Plain radiographs demonstrate moderate degenerative joint disease of the right knee(s). The overall alignment  ismild valgus. The bone quality appears to be good for age and reported activity level.      Assessment/Plan:  End stage arthritis, right knee   The patient history, physical examination, clinical judgment of the provider and imaging studies are consistent with end stage degenerative joint disease of the right knee(s) and total knee arthroplasty is deemed medically necessary. The treatment options including medical management, injection therapy arthroscopy and arthroplasty were discussed at length. The risks and benefits of total knee arthroplasty were presented and reviewed. The risks due to aseptic loosening, infection, stiffness, patella tracking problems, thromboembolic complications and other imponderables were discussed. The patient acknowledged the explanation, agreed to proceed with the plan and consent was signed. Patient is being admitted for inpatient treatment for surgery, pain control, PT, OT, prophylactic antibiotics, VTE prophylaxis, progressive ambulation and ADL's and discharge planning. The patient is planning to be discharged  home with outpt PT     Patient's anticipated LOS is less than 2 midnights, meeting these requirements: - Younger than 77 - Lives within 1 hour of care - Has a competent adult at home to recover with post-op recover - NO history of  - Chronic pain requiring opiods  - Diabetes  - Coronary Artery Disease  - Heart failure  - Heart attack  - Stroke  - DVT/VTE  - Cardiac arrhythmia  - Respiratory Failure/COPD  - Renal failure  - Anemia  - Advanced Liver disease

## 2022-03-16 NOTE — H&P (View-Only) (Signed)
TOTAL KNEE ADMISSION H&P  Patient is being admitted for right total knee arthroplasty.  Subjective:  Chief Complaint:right knee pain.  HPI: Amanda Hammond, 51 y.o. female, has a history of pain and functional disability in the right knee due to arthritis and has failed non-surgical conservative treatments for greater than 12 weeks to includeNSAID's and/or analgesics, corticosteriod injections, viscosupplementation injections, use of assistive devices, and activity modification.  Onset of symptoms was gradual, starting 5 years ago with gradually worsening course since that time. The patient noted no past surgery on the right knee(s).  Patient currently rates pain in the right knee(s) at 8 out of 10 with activity. Patient has night pain, worsening of pain with activity and weight bearing, pain that interferes with activities of daily living, pain with passive range of motion, crepitus, and joint swelling.  Patient has evidence of periarticular osteophytes and joint space narrowing by imaging studies. There is no active infection.  Patient Active Problem List   Diagnosis Date Noted  . Rheumatoid factor positive 11/06/2016  . Trochanteric bursitis of both hips 11/06/2016  . History of fibromyalgia 11/06/2016  . DDD (degenerative disc disease), cervical 11/06/2016  . Carpal tunnel syndrome, left upper limb 11/06/2016  . Carpal tunnel syndrome, right upper limb 11/06/2016  . Trigger finger, left middle finger 11/06/2016  . Major depressive disorder, recurrent episode, moderate (HCC) 04/27/2016  . Pain in left hand 04/25/2016  . Pain in right hand 04/25/2016  . Primary osteoarthritis of both hands 04/25/2016  . Primary osteoarthritis of both knees 04/25/2016  . Primary osteoarthritis of both feet 04/25/2016  . Fibromyalgia 02/17/2016  . Other insomnia 02/17/2016  . Fatigue 02/17/2016  . B12 DEFICIENCY 02/01/2010  . Vitamin D deficiency 02/01/2010  . ANEMIA, IRON DEFICIENCY 12/30/2009  .  OTHER DYSPNEA AND RESPIRATORY ABNORMALITIES 12/30/2009  . Nonspecific (abnormal) findings on radiological and other examination of body structure 12/30/2009  . COMPUTERIZED TOMOGRAPHY, CHEST, ABNORMAL 12/30/2009   Past Medical History:  Diagnosis Date  . Anemia   . Fibromyalgia   . Hypertension   . Osteoarthritis   . PONV (postoperative nausea and vomiting)   . Rheumatoid arthritis (HCC)   . Vitamin D deficiency     Past Surgical History:  Procedure Laterality Date  . APPENDECTOMY  2010   [facility]  . CARPAL TUNNEL RELEASE Left   . CHOLECYSTECTOMY  1999   lap, [facility]  . GASTRIC BYPASS  2003   [facility]  . HERNIA REPAIR  08/2010, 02/2010    incisional, [facility], [facility]  . INCISIONAL HERNIA REPAIR  10/12/2010   Procedure: HERNIA REPAIR INCISIONAL;  Surgeon: Dalia Heading;  Location: [facility];  Service: General;  Laterality: N/A;  Recurrent Incisional Hernia Repair with Mesh  . KNEE SURGERY Right 01/2018   meniscal tear   . KNEE SURGERY Right 12/2019  . PANNICULECTOMY  2008   [facility]  . SHOULDER SURGERY Left   . TENNIS ELBOW RELEASE/NIRSCHEL PROCEDURE Bilateral     Current Outpatient Medications  Medication Sig Dispense Refill Last Dose  . Abatacept (ORENCIA CLICKJECT) 125 MG/ML SOAJ Inject 125 mg into the skin once a week. 12 mL 0   . buPROPion (WELLBUTRIN XL) 300 MG 24 hr tablet Take 300 mg by mouth daily.      . DULoxetine (CYMBALTA) 30 MG capsule Take by mouth at bedtime.      . DULoxetine (CYMBALTA) 60 MG capsule Take 60 mg by mouth daily.  2   . hydroxychloroquine (PLAQUENIL) 200 MG  tablet Take 1 tablet (200 mg total) by mouth 2 (two) times daily. 60 tablet 2   . hydrOXYzine (ATARAX/VISTARIL) 50 MG tablet hydroxyzine HCl 50 mg tablet  TAKE 1 TABLET BY MOUTH EVERY 8 HOURS AS NEEDED FOR ANXIETY     . imipramine (TOFRANIL) 50 MG tablet Take 100 mg by mouth at bedtime.     Marland Kitchen leflunomide (ARAVA) 20 MG tablet Take 1 tablet (20 mg total) by mouth daily. 90 tablet 0   . lidocaine (LIDODERM) 5 % Place  1 patch onto the skin daily. Per pt, as needed (Patient not taking: Reported on 02/08/2022)     . lisinopril-hydrochlorothiazide (PRINZIDE,ZESTORETIC) 10-12.5 MG tablet Take 1 tablet by mouth daily.     Marland Kitchen oxyCODONE-acetaminophen (PERCOCET) 10-325 MG tablet TAKE 1 TABLET BY MOUTH EVERY SIX hours AS NEEDED FOR pain     . predniSONE (DELTASONE) 5 MG tablet Take 4 tablets by mouth daily x4 days, 3 tablets daily x4 days, 2 tablets daily x4 days, 1 tablet daily x4 days. (Patient not taking: Reported on 08/10/2021) 40 tablet 0   . predniSONE (DELTASONE) 5 MG tablet Take 4 tabs po x 7 days, 3  tabs po x 7 days, 2  tabs po x 7 days, 1  tab po x 7 days (Patient not taking: Reported on 08/10/2021) 70 tablet 0   . pregabalin (LYRICA) 200 MG capsule Take 200 mg by mouth 3 (three) times daily.     . Rimegepant Sulfate (NURTEC) 75 MG TBDP as needed.     Marland Kitchen rOPINIRole (REQUIP) 0.25 MG tablet ropinirole 0.25 mg tablet  Take 1 tablet twice a day by oral route.     Marland Kitchen tiZANidine (ZANAFLEX) 4 MG tablet Take 2-4 mg by mouth every 8 (eight) hours.      . topiramate (TOPAMAX) 50 MG tablet Take 100 mg by mouth at bedtime.     . traMADol (ULTRAM) 50 MG tablet Take 1 tablet by mouth 4 (four) times daily as needed.      No current facility-administered medications for this visit.   No Known Allergies  Social History   Tobacco Use  . Smoking status: Never    Passive exposure: Never  . Smokeless tobacco: Never  Substance Use Topics  . Alcohol use: No    Family History  Problem Relation Age of Onset  . Diabetes Mother   . Hypertension Mother   . Hypertension Father   . Hypertension Brother   . Osteoarthritis Brother   . Hypertension Brother   . Osteoarthritis Brother   . Endometriosis Daughter   . Anesthesia problems Neg Hx   . Hypotension Neg Hx   . Pseudochol deficiency Neg Hx   . Malignant hyperthermia Neg Hx      Review of Systems  HENT:  Positive for nosebleeds.   Gastrointestinal:  Positive for  constipation, diarrhea, nausea and vomiting.  Genitourinary:  Positive for dysuria and frequency.  Musculoskeletal:  Positive for arthralgias.  Neurological:  Positive for dizziness and headaches.  Psychiatric/Behavioral:  The patient is nervous/anxious.   All other systems reviewed and are negative.  Objective:  Physical Exam Constitutional:      General: She is not in acute distress.    Appearance: Normal appearance.  HENT:     Head: Normocephalic and atraumatic.  Eyes:     Extraocular Movements: Extraocular movements intact.     Pupils: Pupils are equal, round, and reactive to light.  Cardiovascular:     Rate and Rhythm:  Normal rate and regular rhythm.     Pulses: Normal pulses.     Heart sounds: Normal heart sounds.  Pulmonary:     Effort: Pulmonary effort is normal. No respiratory distress.     Breath sounds: Normal breath sounds. No wheezing.  Abdominal:     General: Abdomen is flat. Bowel sounds are normal. There is no distension.     Palpations: Abdomen is soft.     Tenderness: There is no abdominal tenderness.  Musculoskeletal:     Cervical back: Normal range of motion and neck supple.     Right knee: Swelling and bony tenderness present. No erythema. Normal range of motion. Tenderness present.  Lymphadenopathy:     Cervical: No cervical adenopathy.  Skin:    General: Skin is warm and dry.     Findings: No erythema or rash.  Neurological:     General: No focal deficit present.     Mental Status: She is alert and oriented to person, place, and time.  Psychiatric:        Mood and Affect: Mood normal.        Behavior: Behavior normal.   Vital signs in last 24 hours: @VSRANGES @  Labs:   Estimated body mass index is 40.37 kg/m as calculated from the following:   Height as of 02/08/22: 5\' 5"  (1.651 m).   Weight as of 02/08/22: 110 kg.   Imaging Review Plain radiographs demonstrate moderate degenerative joint disease of the right knee(s). The overall alignment  ismild valgus. The bone quality appears to be good for age and reported activity level.      Assessment/Plan:  End stage arthritis, right knee   The patient history, physical examination, clinical judgment of the provider and imaging studies are consistent with end stage degenerative joint disease of the right knee(s) and total knee arthroplasty is deemed medically necessary. The treatment options including medical management, injection therapy arthroscopy and arthroplasty were discussed at length. The risks and benefits of total knee arthroplasty were presented and reviewed. The risks due to aseptic loosening, infection, stiffness, patella tracking problems, thromboembolic complications and other imponderables were discussed. The patient acknowledged the explanation, agreed to proceed with the plan and consent was signed. Patient is being admitted for inpatient treatment for surgery, pain control, PT, OT, prophylactic antibiotics, VTE prophylaxis, progressive ambulation and ADL's and discharge planning. The patient is planning to be discharged  home with outpt PT     Patient's anticipated LOS is less than 2 midnights, meeting these requirements: - Younger than 77 - Lives within 1 hour of care - Has a competent adult at home to recover with post-op recover - NO history of  - Chronic pain requiring opiods  - Diabetes  - Coronary Artery Disease  - Heart failure  - Heart attack  - Stroke  - DVT/VTE  - Cardiac arrhythmia  - Respiratory Failure/COPD  - Renal failure  - Anemia  - Advanced Liver disease

## 2022-03-21 NOTE — Patient Instructions (Addendum)
DUE TO COVID-19 ONLY TWO VISITORS  (aged 51 and older)  ARE ALLOWED TO COME WITH YOU AND STAY IN THE WAITING ROOM ONLY DURING PRE OP AND PROCEDURE.   **NO VISITORS ARE ALLOWED IN THE SHORT STAY AREA OR RECOVERY ROOM!!**  IF YOU WILL BE ADMITTED INTO THE HOSPITAL YOU ARE ALLOWED ONLY FOUR SUPPORT PEOPLE DURING VISITATION HOURS ONLY (7 AM -8PM)   The support person(s) must pass our screening, gel in and out, and wear a mask at all times, including in the patient's room. Patients must also wear a mask when staff or their support person are in the room. Visitors GUEST BADGE MUST BE WORN VISIBLY  One adult visitor may remain with you overnight and MUST be in the room by 8 P.M.     Your procedure is scheduled on: 03/31/22   Report to [facility] Main Entrance    Report to admitting at : 5:15 AM   Call this number if you have problems the morning of surgery [phone]   Do not eat food :After Midnight.   After Midnight you may have the following liquids until : 4:30 AM DAY OF SURGERY  Water Black Coffee (sugar ok, NO MILK/CREAM OR CREAMERS)  Tea (sugar ok, NO MILK/CREAM OR CREAMERS) regular and decaf                             Plain Jell-O (NO RED)                                           Fruit ices (not with fruit pulp, NO RED)                                     Popsicles (NO RED)                                                                  Juice: apple, WHITE grape, WHITE cranberry Sports drinks like Gatorade (NO RED)   The day of surgery:  Drink ONE (1) Pre-Surgery Clear Ensure or G2 at: 4:30 AM the morning of surgery. Drink in one sitting. Do not sip.  This drink was given to you during your hospital  pre-op appointment visit. Nothing else to drink after completing the  Pre-Surgery Clear Ensure or G2.          If you have questions, please contact your surgeon's office   Oral Hygiene is also important to reduce your risk of infection.                                     Remember - BRUSH YOUR TEETH THE MORNING OF SURGERY WITH YOUR REGULAR TOOTHPASTE  DENTURES WILL BE REMOVED PRIOR TO SURGERY PLEASE DO NOT APPLY "Poly grip" OR ADHESIVES!!!   Do NOT smoke after Midnight   Take these medicines the morning of surgery with A SIP OF WATER: hydroxyzine,pregabalin,bupropion,duloxetine,ropinirole.Nurtec as needed.  You may not have any metal on your body including hair pins, jewelry, and body piercing             Do not wear make-up, lotions, powders, perfumes/cologne, or deodorant  Do not wear nail polish including gel and S&S, artificial/acrylic nails, or any other type of covering on natural nails including finger and toenails. If you have artificial nails, gel coating, etc. that needs to be removed by a nail salon please have this removed prior to surgery or surgery may need to be canceled/ delayed if the surgeon/ anesthesia feels like they are unable to be safely monitored.   Do not shave  48 hours prior to surgery.    Do not bring valuables to the hospital. [place].   Contacts, glasses, or bridgework may not be worn into surgery.   Bring small overnight bag day of surgery.   DO NOT [place]. PHARMACY WILL DISPENSE MEDICATIONS LISTED ON YOUR MEDICATION LIST TO YOU DURING YOUR ADMISSION [place]!    Patients discharged on the day of surgery will not be allowed to drive home.  Someone NEEDS to stay with you for the first 24 hours after anesthesia.   Special Instructions: Bring a copy of your healthcare power of attorney and living will documents         the day of surgery if you haven't scanned them before.              Please read over the following fact sheets you were given: IF YOU HAVE QUESTIONS ABOUT YOUR PRE-OP INSTRUCTIONS PLEASE CALL [phone]    [facility] - Preparing for Surgery Before surgery, you can play an important  role.  Because skin is not sterile, your skin needs to be as free of germs as possible.  You can reduce the number of germs on your skin by washing with CHG (chlorahexidine gluconate) soap before surgery.  CHG is an antiseptic cleaner which kills germs and bonds with the skin to continue killing germs even after washing. Please DO NOT use if you have an allergy to CHG or antibacterial soaps.  If your skin becomes reddened/irritated stop using the CHG and inform your nurse when you arrive at Short Stay. Do not shave (including legs and underarms) for at least 48 hours prior to the first CHG shower.  You may shave your face/neck. Please follow these instructions carefully:  1.  Shower with CHG Soap the night before surgery and the  morning of Surgery.  2.  If you choose to wash your hair, wash your hair first as usual with your  normal  shampoo.  3.  After you shampoo, rinse your hair and body thoroughly to remove the  shampoo.                           4.  Use CHG as you would any other liquid soap.  You can apply chg directly  to the skin and wash                       Gently with a scrungie or clean washcloth.  5.  Apply the CHG Soap to your body ONLY FROM THE NECK DOWN.   Do not use on face/ open  Wound or open sores. Avoid contact with eyes, ears mouth and genitals (private parts).                       Wash face,  Genitals (private parts) with your normal soap.             6.  Wash thoroughly, paying special attention to the area where your surgery  will be performed.  7.  Thoroughly rinse your body with warm water from the neck down.  8.  DO NOT shower/wash with your normal soap after using and rinsing off  the CHG Soap.                9.  Pat yourself dry with a clean towel.            10.  Wear clean pajamas.            11.  Place clean sheets on your bed the night of your first shower and do not  sleep with pets. Day of Surgery : Do not apply any lotions/deodorants  the morning of surgery.  Please wear clean clothes to the hospital/surgery center.  FAILURE TO FOLLOW THESE INSTRUCTIONS MAY RESULT IN THE CANCELLATION OF YOUR SURGERY PATIENT SIGNATURE_________________________________  NURSE SIGNATURE__________________________________  ________________________________________________________________________  Amanda Hammond  An incentive spirometer is a tool that can help keep your lungs clear and active. This tool measures how well you are filling your lungs with each breath. Taking long deep breaths may help reverse or decrease the chance of developing breathing (pulmonary) problems (especially infection) following: A long period of time when you are unable to move or be active. BEFORE THE PROCEDURE  If the spirometer includes an indicator to show your best effort, your nurse or respiratory therapist will set it to a desired goal. If possible, sit up straight or lean slightly forward. Try not to slouch. Hold the incentive spirometer in an upright position. INSTRUCTIONS FOR USE  Sit on the edge of your bed if possible, or sit up as far as you can in bed or on a chair. Hold the incentive spirometer in an upright position. Breathe out normally. Place the mouthpiece in your mouth and seal your lips tightly around it. Breathe in slowly and as deeply as possible, raising the piston or the ball toward the top of the column. Hold your breath for 3-5 seconds or for as long as possible. Allow the piston or ball to fall to the bottom of the column. Remove the mouthpiece from your mouth and breathe out normally. Rest for a few seconds and repeat Steps 1 through 7 at least 10 times every 1-2 hours when you are awake. Take your time and take a few normal breaths between deep breaths. The spirometer may include an indicator to show your best effort. Use the indicator as a goal to work toward during each repetition. After each set of 10 deep breaths, practice  coughing to be sure your lungs are clear. If you have an incision (the cut made at the time of surgery), support your incision when coughing by placing a pillow or rolled up towels firmly against it. Once you are able to get out of bed, walk around indoors and cough well. You may stop using the incentive spirometer when instructed by your caregiver.  RISKS AND COMPLICATIONS Take your time so you do not get dizzy or light-headed. If you are in pain, you may need to take or ask  for pain medication before doing incentive spirometry. It is harder to take a deep breath if you are having pain. AFTER USE Rest and breathe slowly and easily. It can be helpful to keep track of a log of your progress. Your caregiver can provide you with a simple table to help with this. If you are using the spirometer at home, follow these instructions: [place] IF:  You are having difficultly using the spirometer. You have trouble using the spirometer as often as instructed. Your pain medication is not giving enough relief while using the spirometer. You develop fever of 100.5 F (38.1 C) or higher. SEEK IMMEDIATE MEDICAL CARE IF:  You cough up bloody sputum that had not been present before. You develop fever of 102 F (38.9 C) or greater. You develop worsening pain at or near the incision site. MAKE SURE YOU:  Understand these instructions. Will watch your condition. Will get help right away if you are not doing well or get worse. Document Released: 07/31/2006 Document Revised: 06/12/2011 Document Reviewed: 10/01/2006 [facility] Patient Information 2014 [place], [place].   ________________________________________________________________________

## 2022-03-22 ENCOUNTER — Encounter (HOSPITAL_COMMUNITY)
Admission: RE | Admit: 2022-03-22 | Discharge: 2022-03-22 | Disposition: A | Discharge status: Home / Self Care | Payer: Medicare HMO | Source: Ambulatory Visit | Attending: Orthopedic Surgery | Admitting: Orthopedic Surgery

## 2022-03-22 ENCOUNTER — Other Ambulatory Visit: Payer: Self-pay

## 2022-03-22 ENCOUNTER — Encounter (HOSPITAL_COMMUNITY): Payer: Self-pay

## 2022-03-22 VITALS — BP 140/97 | HR 88 | Temp 97.6°F | Ht 65.0 in | Wt 231.0 lb

## 2022-03-22 DIAGNOSIS — I10 Essential (primary) hypertension: Secondary | ICD-10-CM | POA: Insufficient documentation

## 2022-03-22 DIAGNOSIS — G8929 Other chronic pain: Secondary | ICD-10-CM | POA: Insufficient documentation

## 2022-03-22 DIAGNOSIS — M25561 Pain in right knee: Secondary | ICD-10-CM | POA: Diagnosis not present

## 2022-03-22 DIAGNOSIS — Z01818 Encounter for other preprocedural examination: Secondary | ICD-10-CM | POA: Insufficient documentation

## 2022-03-22 DIAGNOSIS — R9431 Abnormal electrocardiogram [ECG] [EKG]: Secondary | ICD-10-CM | POA: Diagnosis not present

## 2022-03-22 HISTORY — DX: Depression, unspecified: F32.A

## 2022-03-22 HISTORY — DX: Anxiety disorder, unspecified: F41.9

## 2022-03-22 HISTORY — DX: Pneumonia, unspecified organism: J18.9

## 2022-03-22 LAB — COMPREHENSIVE METABOLIC PANEL
ALT: 17 U/L (ref 0–44)
AST: 19 U/L (ref 15–41)
Albumin: 3.4 g/dL — ABNORMAL LOW (ref 3.5–5.0)
Alkaline Phosphatase: 87 U/L (ref 38–126)
Anion gap: 8 (ref 5–15)
BUN: 12 mg/dL (ref 6–20)
CO2: 27 mmol/L (ref 22–32)
Calcium: 9 mg/dL (ref 8.9–10.3)
Chloride: 100 mmol/L (ref 98–111)
Creatinine, Ser: 0.87 mg/dL (ref 0.44–1.00)
GFR, Estimated: >60 mL/min (ref >60)
Glucose, Bld: 95 mg/dL (ref 70–99)
Potassium: 4.1 mmol/L (ref 3.5–5.1)
Sodium: 135 mmol/L (ref 135–145)
Total Bilirubin: 0.5 mg/dL (ref 0.3–1.2)
Total Protein: 6.8 g/dL (ref 6.5–8.1)

## 2022-03-22 LAB — CBC WITH DIFFERENTIAL/PLATELET
Abs Immature Granulocytes: 0.02 10*3/uL (ref 0.00–0.07)
Basophils Absolute: 0 10*3/uL (ref 0.0–0.1)
Basophils Relative: 1 %
Eosinophils Absolute: 0.1 10*3/uL (ref 0.0–0.5)
Eosinophils Relative: 2 %
HCT: 37.1 % (ref 36.0–46.0)
Hemoglobin: 11.5 g/dL — ABNORMAL LOW (ref 12.0–15.0)
Immature Granulocytes: 0 %
Lymphocytes Relative: 35 %
Lymphs Abs: 2.2 10*3/uL (ref 0.7–4.0)
MCH: 23.7 pg — ABNORMAL LOW (ref 26.0–34.0)
MCHC: 31 g/dL (ref 30.0–36.0)
MCV: 76.3 fL — ABNORMAL LOW (ref 80.0–100.0)
Monocytes Absolute: 0.7 10*3/uL (ref 0.1–1.0)
Monocytes Relative: 11 %
Neutro Abs: 3.3 10*3/uL (ref 1.7–7.7)
Neutrophils Relative %: 51 %
Platelets: 300 10*3/uL (ref 150–400)
RBC: 4.86 MIL/uL (ref 3.87–5.11)
RDW: 16.3 % — ABNORMAL HIGH (ref 11.5–15.5)
WBC: 6.4 10*3/uL (ref 4.0–10.5)
nRBC: 0 % (ref 0.0–0.2)

## 2022-03-22 LAB — SURGICAL PCR SCREEN
MRSA, PCR: POSITIVE — AB
Staphylococcus aureus: POSITIVE — AB

## 2022-03-22 NOTE — Progress Notes (Signed)
PCR: + MRSA./+ STAPH 

## 2022-03-22 NOTE — Progress Notes (Signed)
For Short Stay: COVID SWAB appointment date:  Bowel Prep reminder:   For Anesthesia: PCP - DO: Harshal Patel Cardiologist -   Chest x-ray -  EKG -  Stress Test -  ECHO - 01/11/17 Cardiac Cath -  Pacemaker/ICD device last checked: Pacemaker orders received: Device Rep notified:  Spinal Cord Stimulator:  Sleep Study -  CPAP -   Fasting Blood Sugar - N/A Checks Blood Sugar _0_ times a day Date and result of last Hgb A1c-5.9: 02/15/22  Last dose of GLP1 agonist-  GLP1 instructions:   Last dose of SGLT-2 inhibitors-  SGLT-2 instructions:   Blood Thinner Instructions: Aspirin Instructions: Last Dose:  Activity level: Can go up a flight of stairs and activities of daily living without stopping and without chest pain and/or shortness of breath   Able to exercise without chest pain and/or shortness of breath   Unable to go up a flight of stairs without chest pain and/or shortness of breath     Anesthesia review: Hx: HTN  Patient denies shortness of breath, fever, cough and chest pain at PAT appointment   Patient verbalized understanding of instructions that were given to them at the PAT appointment. Patient was also instructed that they will need to review over the PAT instructions again at home before surgery.

## 2022-03-23 ENCOUNTER — Ambulatory Visit: Payer: Self-pay | Admitting: Physician Assistant

## 2022-03-23 MED ORDER — VANCOMYCIN HCL 10 G IV SOLR: 1000.0000 mg | INTRAVENOUS | Status: AC

## 2022-03-23 NOTE — Telephone Encounter (Signed)
Received fax from [employer] for [facility] patient assistance, patient's application has been DENIED due to: patient has Medicare Part D low income subsidy. Will need to run test claim to see if [place] is now affordable for patient.   Phone# [phone] Fax: [phone]  Chesley Mires, PharmD, MPH, BCPS, CPP Clinical Pharmacist (Rheumatology and Pulmonology)

## 2022-03-24 ENCOUNTER — Other Ambulatory Visit (HOSPITAL_COMMUNITY): Payer: Self-pay

## 2022-03-24 NOTE — Telephone Encounter (Signed)
Unable to run test claim because patient is enrolled into PAP     Will likely have to reach out to insurance   Chesley Mires, PharmD, MPH, BCPS, CPP Clinical Pharmacist (Rheumatology and Pulmonology)

## 2022-03-29 ENCOUNTER — Encounter: Payer: Self-pay | Admitting: Physician Assistant

## 2022-03-29 ENCOUNTER — Ambulatory Visit: Payer: Medicare HMO | Attending: Physician Assistant | Admitting: Physician Assistant

## 2022-03-29 VITALS — BP 131/78 | HR 89 | Resp 16 | Ht 65.0 in | Wt 236.0 lb

## 2022-03-29 DIAGNOSIS — M170 Bilateral primary osteoarthritis of knee: Secondary | ICD-10-CM

## 2022-03-29 DIAGNOSIS — Z79899 Other long term (current) drug therapy: Secondary | ICD-10-CM | POA: Diagnosis not present

## 2022-03-29 DIAGNOSIS — M19072 Primary osteoarthritis, left ankle and foot: Secondary | ICD-10-CM

## 2022-03-29 DIAGNOSIS — F331 Major depressive disorder, recurrent, moderate: Secondary | ICD-10-CM

## 2022-03-29 DIAGNOSIS — M19041 Primary osteoarthritis, right hand: Secondary | ICD-10-CM

## 2022-03-29 DIAGNOSIS — M19042 Primary osteoarthritis, left hand: Secondary | ICD-10-CM

## 2022-03-29 DIAGNOSIS — M7061 Trochanteric bursitis, right hip: Secondary | ICD-10-CM

## 2022-03-29 DIAGNOSIS — Z111 Encounter for screening for respiratory tuberculosis: Secondary | ICD-10-CM

## 2022-03-29 DIAGNOSIS — M25461 Effusion, right knee: Secondary | ICD-10-CM

## 2022-03-29 DIAGNOSIS — M19071 Primary osteoarthritis, right ankle and foot: Secondary | ICD-10-CM

## 2022-03-29 DIAGNOSIS — M5030 Other cervical disc degeneration, unspecified cervical region: Secondary | ICD-10-CM

## 2022-03-29 DIAGNOSIS — G5601 Carpal tunnel syndrome, right upper limb: Secondary | ICD-10-CM | POA: Diagnosis not present

## 2022-03-29 DIAGNOSIS — E559 Vitamin D deficiency, unspecified: Secondary | ICD-10-CM

## 2022-03-29 DIAGNOSIS — M62838 Other muscle spasm: Secondary | ICD-10-CM

## 2022-03-29 DIAGNOSIS — M0600 Rheumatoid arthritis without rheumatoid factor, unspecified site: Secondary | ICD-10-CM | POA: Diagnosis not present

## 2022-03-29 DIAGNOSIS — G4709 Other insomnia: Secondary | ICD-10-CM

## 2022-03-29 DIAGNOSIS — R5383 Other fatigue: Secondary | ICD-10-CM

## 2022-03-29 DIAGNOSIS — M797 Fibromyalgia: Secondary | ICD-10-CM

## 2022-03-29 DIAGNOSIS — M7062 Trochanteric bursitis, left hip: Secondary | ICD-10-CM

## 2022-03-29 MED ORDER — LEFLUNOMIDE 20 MG PO TABS: 20.0000 mg | ORAL_TABLET | Freq: Every day | ORAL | 0 refills | Status: DC

## 2022-03-29 NOTE — Progress Notes (Signed)
Office Visit Note  Patient: Amanda Hammond             Date of Birth: 07-15-1970           MRN: [id number]             PCP: Catalina Lunger, DO Referring: No ref. provider found Visit Date: 03/29/2022 Occupation: @GUAROCC @  Subjective:  Pain in multiple joints   History of Present Illness: Amanda Hammond is a 51 y.o. female with history of seropositive rheumatoid arthritis, osteoarthritis, and fibromyalgia.  Patient is currently prescribed Orencia 125 mg subcutaneous injections once weekly, Plaquenil 200 mg 1 tablet by mouth twice daily, and Arava 20 mg 1 tablet by mouth daily.  Her last dose of Orencia was on Thursday.  She has been tolerating these medications without any side effects or injection site reactions from Orencia.  Patient is scheduled for a right knee replacement Dr. Madelon Lips on Friday, 03/31/2022.  According to the patient she was advised she did not need to hold any of her medications prior to the scheduled knee replacement.  Patient reports that she will be starting physical therapy on 04/04/22.   Patient reports that she continues to have chronic pain in both shoulder joints, both hands, right knee, and left ankle joint.  She continues to notice intermittent inflammation in her hands, right knee, and her ankles.  She is also been having some increased discomfort in her lower back as well as trochanter bursitis of both hips.  She has generalized myalgias and muscle tenderness due to fibromyalgia. She denies any recent or recurrent infections.  She denies any other new medical conditions.  Activities of Daily Living:  Patient reports morning stiffness for 5 hours.   Patient Reports nocturnal pain.  Difficulty dressing/grooming: Denies Difficulty climbing stairs: Reports Difficulty getting out of chair: Reports Difficulty using hands for taps, buttons, cutlery, and/or writing: Reports  Review of Systems  Constitutional:  Positive for fatigue.  HENT:  Positive for mouth  dryness. Negative for mouth sores.   Eyes:  Negative for dryness.  Respiratory:  Negative for shortness of breath.   Cardiovascular:  Positive for palpitations. Negative for chest pain.  Gastrointestinal:  Positive for constipation and diarrhea. Negative for blood in stool.  Endocrine: Negative for increased urination.  Genitourinary:  Negative for involuntary urination.  Musculoskeletal:  Positive for joint pain, gait problem, joint pain, joint swelling, myalgias, muscle weakness, morning stiffness, muscle tenderness and myalgias.  Skin:  Negative for color change, rash, hair loss and sensitivity to sunlight.  Allergic/Immunologic: Negative for susceptible to infections.  Neurological:  Positive for dizziness and headaches.  Hematological:  Negative for swollen glands.  Psychiatric/Behavioral:  Positive for depressed mood and sleep disturbance. The patient is nervous/anxious.     PMFS History:  Patient Active Problem List   Diagnosis Date Noted   Rheumatoid factor positive 11/06/2016   Trochanteric bursitis of both hips 11/06/2016   History of fibromyalgia 11/06/2016   DDD (degenerative disc disease), cervical 11/06/2016   Carpal tunnel syndrome, left upper limb 11/06/2016   Carpal tunnel syndrome, right upper limb 11/06/2016   Trigger finger, left middle finger 11/06/2016   Major depressive disorder, recurrent episode, moderate (HCC) 04/27/2016   Pain in left hand 04/25/2016   Pain in right hand 04/25/2016   Primary osteoarthritis of both hands 04/25/2016   Primary osteoarthritis of both knees 04/25/2016   Primary osteoarthritis of both feet 04/25/2016   Fibromyalgia 02/17/2016   Other insomnia 02/17/2016  Fatigue 02/17/2016   B12 DEFICIENCY 02/01/2010   Vitamin D deficiency 02/01/2010   ANEMIA, IRON DEFICIENCY 12/30/2009   OTHER DYSPNEA AND RESPIRATORY ABNORMALITIES 12/30/2009   Nonspecific (abnormal) findings on radiological and other examination of body structure  12/30/2009   COMPUTERIZED TOMOGRAPHY, CHEST, ABNORMAL 12/30/2009    Past Medical History:  Diagnosis Date   Anemia    Anxiety    Depression    Fibromyalgia    Hypertension    Osteoarthritis    Pneumonia    PONV (postoperative nausea and vomiting)    Rheumatoid arthritis (HCC)    Vitamin D deficiency     Family History  Problem Relation Age of Onset   Diabetes Mother    Hypertension Mother    Hypertension Father    Hypertension Brother    Osteoarthritis Brother    Hypertension Brother    Osteoarthritis Brother    Endometriosis Daughter    Anesthesia problems Neg Hx    Hypotension Neg Hx    Pseudochol deficiency Neg Hx    Malignant hyperthermia Neg Hx    Past Surgical History:  Procedure Laterality Date   APPENDECTOMY  2010   [facility]   CARPAL TUNNEL RELEASE Left    CHOLECYSTECTOMY  1999   lap, [facility]   GASTRIC BYPASS  2003   [facility]   HERNIA REPAIR  08/2010, 02/2010    incisional, [facility], [facility]   INCISIONAL HERNIA REPAIR  10/12/2010   Procedure: HERNIA REPAIR INCISIONAL;  Surgeon: Dalia Heading;  Location: [facility];  Service: General;  Laterality: N/A;  Recurrent Incisional Hernia Repair with Mesh   KNEE SURGERY Right 01/2018   meniscal tear    KNEE SURGERY Right 12/2019   PANNICULECTOMY  2008   [facility]   SHOULDER SURGERY Left    TENNIS ELBOW RELEASE/NIRSCHEL PROCEDURE Bilateral    Social History   Social History Narrative   Not on file    There is no immunization history on file for this patient.   Objective: Vital Signs: BP 131/78 (BP Location: Left Arm, Patient Position: Sitting, Cuff Size: Normal)   Pulse 89   Resp 16   Ht 5\' 5"  (1.651 m)   Wt 236 lb (107 kg)   BMI 39.27 kg/m    Physical Exam Vitals and nursing note reviewed.  Constitutional:      Appearance: She is well-developed.  HENT:     Head: Normocephalic and atraumatic.  Eyes:     Conjunctiva/sclera: Conjunctivae normal.  Cardiovascular:     Rate and Rhythm: Normal rate and regular rhythm.      Heart sounds: Normal heart sounds.  Pulmonary:     Effort: Pulmonary effort is normal.     Breath sounds: Normal breath sounds.  Abdominal:     General: Bowel sounds are normal.     Palpations: Abdomen is soft.  Musculoskeletal:     Cervical back: Normal range of motion.  Skin:    General: Skin is warm and dry.     Capillary Refill: Capillary refill takes less than 2 seconds.  Neurological:     Mental Status: She is alert and oriented to person, place, and time.  Psychiatric:        Behavior: Behavior normal.      Musculoskeletal Exam: Generalized hyperalgesia and positive tender points.  C-spine has limited range of motion.  Trapezius muscle tension tenderness bilaterally.  Painful range of motion of both shoulder joints to about 110 degrees of abduction.  Tenderness along the deltoid insertion  site bilaterally.  Elbow joints, wrist joints, MCPs, PIPs, DIPs have good range of motion with no synovitis.  Some tenderness over the right second MCP and PIP joint.  The left second MCP and left fifth PIP joint. Hip joints have good ROM.  Right knee has limited extension with warmth and an effusion.  Left knee has good ROM with no warmth or effusion.  Ankle joints have good ROM with no tenderness or joint swelling.   CDAI Exam: CDAI Score: 7.8  Patient Global: 10 mm; Provider Global: 8 mm Swollen: 1 ; Tender: 6  Joint Exam 03/29/2022      Right  Left  MCP 2   Tender   Tender  PIP 2   Tender     PIP 5      Tender  Knee  Swollen Tender     Ankle      Tender     Investigation: No additional findings.  Imaging: No results found.  Recent Labs: Lab Results  Component Value Date   WBC 6.4 03/22/2022   HGB 11.5 (L) 03/22/2022   PLT 300 03/22/2022   NA 135 03/22/2022   K 4.1 03/22/2022   CL 100 03/22/2022   CO2 27 03/22/2022   GLUCOSE 95 03/22/2022   BUN 12 03/22/2022   CREATININE 0.87 03/22/2022   BILITOT 0.5 03/22/2022   ALKPHOS 87 03/22/2022   AST 19 03/22/2022   ALT 17  03/22/2022   PROT 6.8 03/22/2022   ALBUMIN 3.4 (L) 03/22/2022   CALCIUM 9.0 03/22/2022   GFRAA 67 09/02/2020   QFTBGOLDPLUS NEGATIVE 04/13/2021    Speciality Comments: Orencia started 08/11/21  Patient has PLQ eye exam scheduled for 03/23/2022  Procedures:  No procedures performed Allergies: Patient has no known allergies.    Assessment / Plan:     Visit Diagnoses: Seronegative rheumatoid arthritis [facility]): Patient continues to experience intermittent arthralgias and joint inflammation.  She has been experiencing increased discomfort in both shoulders, both hands, right knee, and left ankle.  She is scheduled for a right total knee replacement on 03/31/2022 which will be performed by Dr. Madelon Lips.  She is currently on Orencia 125 mg subcutaneous injections once weekly, Arava 20 mg daily, and Plaquenil 200 mg 1 tablet by mouth twice daily. She is not currently taking any prednisone.  Her last dose of Orencia was administered last Thursday.  She was advised to hold her dose of Orencia this week along with Arava until she has been cleared to resume therapy during the postoperative period by Dr. Madelon Lips.   She will notify us if she develops signs or symptoms of a flare.  She will follow up in 3 months or sooner if needed.   High risk medication use - Orencia 125 mg subcutaneous injections once weekly, Arava 20 mg daily, and Plaquenil 200 mg 1 tablet by mouth twice daily. CBC and CMP were drawn on 03/22/2022.  Her next lab work will be due in March and every 3 months to monitor for drug toxicity. TB Gold negative on 04/13/2021.  Order for TB gold released today. Patient was advised to hold her dose of Orencia tomorrow along with Arava until she has been cleared by Dr. Madelon Lips during the postoperative period. Discussed the importance of always holding Orencia and Arava if she develops signs or symptoms of infection and to resume once the infection has completely cleared. We will call to obtain most  recent plaquenil eye exam results.  Plan: QuantiFERON-TB Gold Plus  Screening  for tuberculosis - Order for TB gold released today. Plan: QuantiFERON-TB Gold Plus  Primary osteoarthritis of both hands: PIP and DIP thickening consistent with osteoarthritis of both hands.  She has been experiencing increased pain and stiffness in both hands due to ongoing osteoarthritis.  On examination today she had some tenderness over bilateral second MCP joints but most of her discomfort was in the DIP joints today.  Carpal tunnel syndrome, right upper limb: Asymptomatic currently.   Trochanteric bursitis of both hips: She has tenderness over bilateral trochanteric bursa.  Discussed the importance of performing stretching exercises daily.  Primary osteoarthritis of both knees: She has chronic right knee joint pain.  Limited extension of the right knee with warmth and swelling noted.  Patient is scheduled for a right knee replacement on Friday 03/31/22 which will be performed by Dr. Madelon Lips.  The left knee has good range of motion with no warmth or effusion.  Effusion, right knee: She is scheduled for a right knee replacement on Friday, 03/31/22.    Primary osteoarthritis of both feet: She has some tenderness of the left ankle joint but no synovitis noted.    DDD (degenerative disc disease), cervical: C-spine has limited range of motion especially with lateral rotation.  She has trapezius muscle tension and tenderness bilaterally.  Trapezius muscle spasm: She is experiencing trapezius muscle tension and tenderness bilaterally.  She has had muscle spasms intermittently.  She will notify us when and if she would like to return to have trigger point injections performed in the future.  Fibromyalgia: Generalized hyperalgesia on exam.  She continues to experience generalized myalgias and muscle tenderness due to fibromyalgia.  She remains on Cymbalta as prescribed and takes Percocet, tramadol, and Lyrica for pain  relief.  Other insomnia: She takes Topamax 100 mg at bedtime.  Other fatigue: Chronic and secondary to insomnia.  Other medical conditions are listed as follows:   Vitamin D deficiency  Major depressive disorder, recurrent episode, moderate (HCC)     Orders: Orders Placed This Encounter  Procedures   QuantiFERON-TB Gold Plus   Meds ordered this encounter  Medications   leflunomide (ARAVA) 20 MG tablet    Sig: Take 1 tablet (20 mg total) by mouth daily.    Dispense:  90 tablet    Refill:  0    Follow-Up Instructions: Return in about 3 months (around 06/28/2022) for Rheumatoid arthritis, Osteoarthritis, Fibromyalgia.   Gearldine Bienenstock, PA-C  Note - This record has been created using Dragon software.  Chart creation errors have been sought, but may not always  have been located. Such creation errors do not reflect on  the standard of medical care.

## 2022-03-30 MED ORDER — TRANEXAMIC ACID 1000 MG/10ML IV SOLN
2000.0000 mg | INTRAVENOUS | Status: DC
  Filled 2022-03-30: qty 20

## 2022-03-30 NOTE — Anesthesia Preprocedure Evaluation (Addendum)
Anesthesia Evaluation  Patient identified by MRN, date of birth, ID band Patient awake    Reviewed: Allergy & Precautions, NPO status , Patient's Chart, lab work & pertinent test results  History of Anesthesia Complications Negative for: history of anesthetic complications  Airway Mallampati: II  TM Distance: >3 FB Neck ROM: Full    Dental  (+) Lower Dentures, Partial Upper   Pulmonary neg pulmonary ROS   Pulmonary exam normal        Cardiovascular hypertension, Pt. on medications Normal cardiovascular exam     Neuro/Psych   Anxiety Depression    negative neurological ROS     GI/Hepatic negative GI ROS, Neg liver ROS,,,  Endo/Other  negative endocrine ROS    Renal/GU negative Renal ROS  negative genitourinary   Musculoskeletal  (+) Arthritis , Osteoarthritis and Rheumatoid disorders,  Fibromyalgia -  Abdominal   Peds  Hematology  (+) Blood dyscrasia, anemia   Anesthesia Other Findings Chronic oxycodone use  Reproductive/Obstetrics                             Anesthesia Physical Anesthesia Plan  ASA: 2  Anesthesia Plan: Spinal   Post-op Pain Management: Ketamine IV*, Regional block*, Tylenol PO (pre-op)* and Toradol IV (intra-op)*   Induction:   PONV Risk Score and Plan: 2 and Propofol infusion, Treatment may vary due to age or medical condition, Ondansetron, TIVA and Midazolam  Airway Management Planned: Nasal Cannula and Simple Face Mask  Additional Equipment: None  Intra-op Plan:   Post-operative Plan:   Informed Consent: I have reviewed the patients History and Physical, chart, labs and discussed the procedure including the risks, benefits and alternatives for the proposed anesthesia with the patient or authorized representative who has indicated his/her understanding and acceptance.       Plan Discussed with:   Anesthesia Plan Comments:         Anesthesia  Quick Evaluation

## 2022-03-31 ENCOUNTER — Other Ambulatory Visit: Payer: Self-pay

## 2022-03-31 ENCOUNTER — Ambulatory Visit (HOSPITAL_COMMUNITY)
Admission: RE | Admit: 2022-03-31 | Discharge: 2022-03-31 | Disposition: A | Discharge status: Home / Self Care | Payer: Medicare HMO | Source: Ambulatory Visit | Attending: Orthopedic Surgery | Admitting: Orthopedic Surgery

## 2022-03-31 ENCOUNTER — Ambulatory Visit (HOSPITAL_COMMUNITY): Payer: Medicare HMO | Admitting: Anesthesiology

## 2022-03-31 ENCOUNTER — Encounter (HOSPITAL_COMMUNITY)
Admission: RE | Disposition: A | Discharge status: Home / Self Care | Payer: Self-pay | Source: Ambulatory Visit | Attending: Orthopedic Surgery

## 2022-03-31 ENCOUNTER — Encounter (HOSPITAL_COMMUNITY): Payer: Self-pay | Admitting: Orthopedic Surgery

## 2022-03-31 DIAGNOSIS — R262 Difficulty in walking, not elsewhere classified: Secondary | ICD-10-CM | POA: Insufficient documentation

## 2022-03-31 DIAGNOSIS — I10 Essential (primary) hypertension: Secondary | ICD-10-CM | POA: Insufficient documentation

## 2022-03-31 DIAGNOSIS — M1711 Unilateral primary osteoarthritis, right knee: Secondary | ICD-10-CM | POA: Insufficient documentation

## 2022-03-31 DIAGNOSIS — Z01818 Encounter for other preprocedural examination: Secondary | ICD-10-CM

## 2022-03-31 HISTORY — PX: TOTAL KNEE ARTHROPLASTY: SHX125

## 2022-03-31 LAB — TYPE AND SCREEN
ABO/RH(D): B POS
Antibody Screen: NEGATIVE

## 2022-03-31 LAB — QUANTIFERON-TB GOLD PLUS
Mitogen-NIL: >10 IU/mL
NIL: 0.06 IU/mL
QuantiFERON-TB Gold Plus: NEGATIVE
TB1-NIL: 0.01 IU/mL
TB2-NIL: <0 IU/mL

## 2022-03-31 LAB — POCT PREGNANCY, URINE: Preg Test, Ur: NEGATIVE

## 2022-03-31 LAB — ABO/RH: ABO/RH(D): B POS

## 2022-03-31 SURGERY — ARTHROPLASTY, KNEE, TOTAL
Anesthesia: Spinal | Site: Knee | Laterality: Right

## 2022-03-31 MED ORDER — LACTATED RINGERS IV BOLUS
500.0000 mL | Freq: Once | INTRAVENOUS | Status: AC
  Administered 2022-03-31: 500 mL via INTRAVENOUS

## 2022-03-31 MED ORDER — METOCLOPRAMIDE HCL 5 MG/ML IJ SOLN: 5.0000 mg | Freq: Three times a day (TID) | INTRAMUSCULAR | Status: DC | PRN

## 2022-03-31 MED ORDER — METHOCARBAMOL 500 MG IVPB - SIMPLE MED
INTRAVENOUS | Status: AC
  Administered 2022-03-31: 500 mg via INTRAVENOUS
  Filled 2022-03-31: qty 55

## 2022-03-31 MED ORDER — VANCOMYCIN HCL 1500 MG/300ML IV SOLN
1500.0000 mg | INTRAVENOUS | Status: AC
  Administered 2022-03-31: 1500 mg via INTRAVENOUS
  Filled 2022-03-31: qty 300

## 2022-03-31 MED ORDER — ORAL CARE MOUTH RINSE: 15.0000 mL | Freq: Once | OROMUCOSAL | Status: AC

## 2022-03-31 MED ORDER — TRANEXAMIC ACID-NACL 1000-0.7 MG/100ML-% IV SOLN
1000.0000 mg | Freq: Once | INTRAVENOUS | Status: AC
  Administered 2022-03-31: 1000 mg via INTRAVENOUS

## 2022-03-31 MED ORDER — OXYCODONE-ACETAMINOPHEN 10-325 MG PO TABS: 1.0000 | ORAL_TABLET | ORAL | 0 refills | Status: AC | PRN

## 2022-03-31 MED ORDER — BUPIVACAINE-EPINEPHRINE (PF) 0.5% -1:200000 IJ SOLN
INTRAMUSCULAR | Status: AC
  Filled 2022-03-31: qty 30

## 2022-03-31 MED ORDER — LACTATED RINGERS IV SOLN: INTRAVENOUS | Status: DC

## 2022-03-31 MED ORDER — AMISULPRIDE (ANTIEMETIC) 5 MG/2ML IV SOLN: 10.0000 mg | Freq: Once | INTRAVENOUS | Status: DC | PRN

## 2022-03-31 MED ORDER — MIDAZOLAM HCL 2 MG/2ML IJ SOLN
INTRAMUSCULAR | Status: AC
  Filled 2022-03-31: qty 2

## 2022-03-31 MED ORDER — ACETAMINOPHEN 500 MG PO TABS
1000.0000 mg | ORAL_TABLET | Freq: Once | ORAL | Status: DC
  Filled 2022-03-31: qty 2

## 2022-03-31 MED ORDER — BUPIVACAINE LIPOSOME 1.3 % IJ SUSP: 20.0000 mL | Freq: Once | INTRAMUSCULAR | Status: DC

## 2022-03-31 MED ORDER — METHOCARBAMOL 500 MG IVPB - SIMPLE MED: 500.0000 mg | Freq: Four times a day (QID) | INTRAVENOUS | Status: DC | PRN

## 2022-03-31 MED ORDER — OXYCODONE HCL 5 MG PO TABS
ORAL_TABLET | ORAL | Status: AC
  Administered 2022-03-31: 10 mg via ORAL
  Filled 2022-03-31: qty 2

## 2022-03-31 MED ORDER — ONDANSETRON HCL 4 MG PO TABS
4.0000 mg | ORAL_TABLET | Freq: Four times a day (QID) | ORAL | Status: DC | PRN
  Filled 2022-03-31: qty 1

## 2022-03-31 MED ORDER — 0.9 % SODIUM CHLORIDE (POUR BTL) OPTIME
TOPICAL | Status: DC | PRN
  Administered 2022-03-31: 1000 mL

## 2022-03-31 MED ORDER — OXYCODONE HCL 5 MG PO TABS
ORAL_TABLET | ORAL | Status: AC
  Filled 2022-03-31: qty 1

## 2022-03-31 MED ORDER — ROPIVACAINE HCL 5 MG/ML IJ SOLN
INTRAMUSCULAR | Status: DC | PRN
  Administered 2022-03-31: 30 mL via PERINEURAL

## 2022-03-31 MED ORDER — PROPOFOL 10 MG/ML IV BOLUS
INTRAVENOUS | Status: DC | PRN
  Administered 2022-03-31: 20 mg via INTRAVENOUS

## 2022-03-31 MED ORDER — OXYCODONE HCL 5 MG/5ML PO SOLN: 5.0000 mg | Freq: Once | ORAL | Status: AC | PRN

## 2022-03-31 MED ORDER — CHLORHEXIDINE GLUCONATE 0.12 % MT SOLN
15.0000 mL | Freq: Once | OROMUCOSAL | Status: AC
  Administered 2022-03-31: 15 mL via OROMUCOSAL

## 2022-03-31 MED ORDER — HYDROMORPHONE HCL 1 MG/ML IJ SOLN: 0.5000 mg | INTRAMUSCULAR | Status: DC | PRN

## 2022-03-31 MED ORDER — VANCOMYCIN HCL IN DEXTROSE 1-5 GM/200ML-% IV SOLN: 1000.0000 mg | Freq: Two times a day (BID) | INTRAVENOUS | Status: DC

## 2022-03-31 MED ORDER — LACTATED RINGERS IV BOLUS
250.0000 mL | Freq: Once | INTRAVENOUS | Status: AC
  Administered 2022-03-31: 250 mL via INTRAVENOUS

## 2022-03-31 MED ORDER — TRANEXAMIC ACID 1000 MG/10ML IV SOLN
INTRAVENOUS | Status: DC | PRN
  Administered 2022-03-31: 2000 mg via TOPICAL

## 2022-03-31 MED ORDER — SODIUM CHLORIDE 0.9 % IR SOLN
Status: DC | PRN
  Administered 2022-03-31: 1000 mL

## 2022-03-31 MED ORDER — APIXABAN 2.5 MG PO TABS: 2.5000 mg | ORAL_TABLET | Freq: Two times a day (BID) | ORAL | 0 refills | Status: AC

## 2022-03-31 MED ORDER — HYDROMORPHONE HCL 1 MG/ML IJ SOLN
INTRAMUSCULAR | Status: AC
  Administered 2022-03-31: 0.5 mg via INTRAVENOUS
  Filled 2022-03-31: qty 1

## 2022-03-31 MED ORDER — WATER FOR IRRIGATION, STERILE IR SOLN
Status: DC | PRN
  Administered 2022-03-31: 2000 mL

## 2022-03-31 MED ORDER — BUPIVACAINE-EPINEPHRINE 0.5% -1:200000 IJ SOLN
INTRAMUSCULAR | Status: DC | PRN
  Administered 2022-03-31: 15 mL

## 2022-03-31 MED ORDER — ACETAMINOPHEN 500 MG PO TABS
1000.0000 mg | ORAL_TABLET | Freq: Once | ORAL | Status: AC
  Administered 2022-03-31: 1000 mg via ORAL

## 2022-03-31 MED ORDER — OXYCODONE HCL 5 MG PO TABS
5.0000 mg | ORAL_TABLET | Freq: Once | ORAL | Status: AC | PRN
  Administered 2022-03-31: 5 mg via ORAL

## 2022-03-31 MED ORDER — SODIUM CHLORIDE 0.9 % IV SOLN: INTRAVENOUS | Status: DC

## 2022-03-31 MED ORDER — METOCLOPRAMIDE HCL 5 MG PO TABS
5.0000 mg | ORAL_TABLET | Freq: Three times a day (TID) | ORAL | Status: DC | PRN
  Filled 2022-03-31: qty 2

## 2022-03-31 MED ORDER — ONDANSETRON HCL 4 MG/2ML IJ SOLN
INTRAMUSCULAR | Status: DC | PRN
  Administered 2022-03-31: 4 mg via INTRAVENOUS

## 2022-03-31 MED ORDER — BUPIVACAINE LIPOSOME 1.3 % IJ SUSP
INTRAMUSCULAR | Status: AC
  Filled 2022-03-31: qty 20

## 2022-03-31 MED ORDER — BUPIVACAINE LIPOSOME 1.3 % IJ SUSP
INTRAMUSCULAR | Status: DC | PRN
  Administered 2022-03-31: 20 mL

## 2022-03-31 MED ORDER — ACETAMINOPHEN 500 MG PO TABS: 1000.0000 mg | ORAL_TABLET | Freq: Four times a day (QID) | ORAL | Status: DC

## 2022-03-31 MED ORDER — SODIUM CHLORIDE (PF) 0.9 % IJ SOLN
INTRAMUSCULAR | Status: AC
  Filled 2022-03-31: qty 50

## 2022-03-31 MED ORDER — FENTANYL CITRATE (PF) 100 MCG/2ML IJ SOLN
INTRAMUSCULAR | Status: AC
  Filled 2022-03-31: qty 2

## 2022-03-31 MED ORDER — FENTANYL CITRATE (PF) 100 MCG/2ML IJ SOLN
INTRAMUSCULAR | Status: DC | PRN
  Administered 2022-03-31 (×2): 50 ug via INTRAVENOUS

## 2022-03-31 MED ORDER — POVIDONE-IODINE 10 % EX SWAB
2.0000 | Freq: Once | CUTANEOUS | Status: AC
  Administered 2022-03-31: 2 via TOPICAL

## 2022-03-31 MED ORDER — MIDAZOLAM HCL 5 MG/5ML IJ SOLN
INTRAMUSCULAR | Status: DC | PRN
  Administered 2022-03-31 (×4): 1 mg via INTRAVENOUS

## 2022-03-31 MED ORDER — TRANEXAMIC ACID-NACL 1000-0.7 MG/100ML-% IV SOLN
INTRAVENOUS | Status: AC
  Filled 2022-03-31: qty 100

## 2022-03-31 MED ORDER — ACETAMINOPHEN 325 MG PO TABS: 325.0000 mg | ORAL_TABLET | Freq: Four times a day (QID) | ORAL | Status: DC | PRN

## 2022-03-31 MED ORDER — BUPIVACAINE IN DEXTROSE 0.75-8.25 % IT SOLN
INTRATHECAL | Status: DC | PRN
  Administered 2022-03-31: 1.6 mL via INTRATHECAL

## 2022-03-31 MED ORDER — ACETAMINOPHEN 500 MG PO TABS
ORAL_TABLET | ORAL | Status: AC
  Administered 2022-03-31: 1000 mg via ORAL
  Filled 2022-03-31: qty 2

## 2022-03-31 MED ORDER — DEXAMETHASONE SODIUM PHOSPHATE 10 MG/ML IJ SOLN
INTRAMUSCULAR | Status: DC | PRN
  Administered 2022-03-31: 10 mg via INTRAVENOUS

## 2022-03-31 MED ORDER — OXYCODONE HCL 5 MG PO TABS: 5.0000 mg | ORAL_TABLET | ORAL | Status: DC | PRN

## 2022-03-31 MED ORDER — TRANEXAMIC ACID-NACL 1000-0.7 MG/100ML-% IV SOLN
1000.0000 mg | INTRAVENOUS | Status: AC
  Administered 2022-03-31: 1000 mg via INTRAVENOUS
  Filled 2022-03-31: qty 100

## 2022-03-31 MED ORDER — OXYCODONE HCL 5 MG PO TABS: 10.0000 mg | ORAL_TABLET | ORAL | Status: DC | PRN

## 2022-03-31 MED ORDER — PROPOFOL 500 MG/50ML IV EMUL
INTRAVENOUS | Status: DC | PRN
  Administered 2022-03-31: 50 ug/kg/min via INTRAVENOUS

## 2022-03-31 MED ORDER — PROPOFOL 1000 MG/100ML IV EMUL
INTRAVENOUS | Status: AC
  Filled 2022-03-31: qty 100

## 2022-03-31 MED ORDER — ONDANSETRON HCL 4 MG/2ML IJ SOLN: 4.0000 mg | Freq: Four times a day (QID) | INTRAMUSCULAR | Status: DC | PRN

## 2022-03-31 MED ORDER — KETAMINE HCL 10 MG/ML IJ SOLN
INTRAMUSCULAR | Status: DC | PRN
  Administered 2022-03-31 (×3): 10 mg via INTRAVENOUS

## 2022-03-31 MED ORDER — CEFAZOLIN SODIUM-DEXTROSE 2-4 GM/100ML-% IV SOLN
2.0000 g | INTRAVENOUS | Status: AC
  Administered 2022-03-31: 2 g via INTRAVENOUS
  Filled 2022-03-31: qty 100

## 2022-03-31 MED ORDER — HYDROMORPHONE HCL 1 MG/ML IJ SOLN
0.2500 mg | INTRAMUSCULAR | Status: DC | PRN
  Administered 2022-03-31 (×2): 0.5 mg via INTRAVENOUS

## 2022-03-31 MED ORDER — SODIUM CHLORIDE 0.9% FLUSH
INTRAVENOUS | Status: DC | PRN
  Administered 2022-03-31: 50 mL

## 2022-03-31 MED ORDER — KETAMINE HCL 10 MG/ML IJ SOLN
INTRAMUSCULAR | Status: AC
  Filled 2022-03-31: qty 1

## 2022-03-31 MED ORDER — ONDANSETRON HCL 4 MG/2ML IJ SOLN: 4.0000 mg | Freq: Once | INTRAMUSCULAR | Status: DC | PRN

## 2022-03-31 SURGICAL SUPPLY — 65 items
APL SKNCLS STERI-STRIP NONHPOA (GAUZE/BANDAGES/DRESSINGS) ×1
ATTUNE MED DOME PAT 38 KNEE (Knees) IMPLANT
ATTUNE PS FEM RT SZ 4 CEM KNEE (Femur) IMPLANT
ATTUNE PSRP INSR SZ4 6 KNEE (Insert) IMPLANT
BAG COUNTER SPONGE SURGICOUNT (BAG) ×1 IMPLANT
BAG DECANTER FOR FLEXI CONT (MISCELLANEOUS) ×1 IMPLANT
BAG SPEC THK2 15X12 ZIP CLS (MISCELLANEOUS) ×1
BAG SPNG CNTER NS LX DISP (BAG) ×1
BAG ZIPLOCK 12X15 (MISCELLANEOUS) ×1 IMPLANT
BASE TIBIAL ROT PLAT SZ 5 KNEE (Knees) IMPLANT
BENZOIN TINCTURE PRP APPL 2/3 (GAUZE/BANDAGES/DRESSINGS) ×1 IMPLANT
BLADE SAGITTAL 25.0X1.19X90 (BLADE) ×1 IMPLANT
BLADE SAW SGTL 13X75X1.27 (BLADE) ×1 IMPLANT
BLADE SURG 15 STRL LF DISP TIS (BLADE) ×1 IMPLANT
BLADE SURG 15 STRL SS (BLADE) ×1
BLADE SURG SZ10 CARB STEEL (BLADE) ×2 IMPLANT
BNDG CMPR MED 10X6 ELC LF (GAUZE/BANDAGES/DRESSINGS) ×1
BNDG CMPR MED 15X6 ELC VLCR LF (GAUZE/BANDAGES/DRESSINGS) ×1
BNDG ELASTIC 6X10 VLCR STRL LF (GAUZE/BANDAGES/DRESSINGS) IMPLANT
BNDG ELASTIC 6X15 VLCR STRL LF (GAUZE/BANDAGES/DRESSINGS) ×1 IMPLANT
BONE CEMENT GENTAMICIN (Cement) ×2 IMPLANT
BOWL SMART MIX CTS (DISPOSABLE) ×1 IMPLANT
BSPLAT TIB 5 CMNT ROT PLAT STR (Knees) ×1 IMPLANT
CEMENT BONE GENTAMICIN 40 (Cement) IMPLANT
CLSR STERI-STRIP ANTIMIC 1/2X4 (GAUZE/BANDAGES/DRESSINGS) ×2 IMPLANT
COVER SURGICAL LIGHT HANDLE (MISCELLANEOUS) ×1 IMPLANT
CUFF TOURN SGL QUICK 34 (TOURNIQUET CUFF) ×1
CUFF TRNQT CYL 34X4.125X (TOURNIQUET CUFF) ×1 IMPLANT
DRAPE INCISE IOBAN 66X45 STRL (DRAPES) ×1 IMPLANT
DRAPE U-SHAPE 47X51 STRL (DRAPES) ×1 IMPLANT
DRESSING AQUACEL AG SP 3.5X10 (GAUZE/BANDAGES/DRESSINGS) ×1 IMPLANT
DRSG AQUACEL AG ADV 3.5X10 (GAUZE/BANDAGES/DRESSINGS) IMPLANT
DRSG AQUACEL AG SP 3.5X10 (GAUZE/BANDAGES/DRESSINGS) ×1
DURAPREP 26ML APPLICATOR (WOUND CARE) ×2 IMPLANT
ELECT REM PT RETURN 15FT ADLT (MISCELLANEOUS) ×1 IMPLANT
GLOVE BIOGEL PI IND STRL 8 (GLOVE) ×2 IMPLANT
GLOVE SURG ORTHO 8.0 STRL STRW (GLOVE) ×1 IMPLANT
GOWN STRL REUS W/ TWL XL LVL3 (GOWN DISPOSABLE) ×2 IMPLANT
GOWN STRL REUS W/TWL XL LVL3 (GOWN DISPOSABLE) ×2
HANDPIECE INTERPULSE COAX TIP (DISPOSABLE) ×1
HOLDER FOLEY CATH W/STRAP (MISCELLANEOUS) IMPLANT
HOOD PEEL AWAY T7 (MISCELLANEOUS) ×1 IMPLANT
IMMOBILIZER KNEE 20 (SOFTGOODS) ×1
IMMOBILIZER KNEE 20 THIGH 36 (SOFTGOODS) ×1 IMPLANT
KIT TURNOVER KIT A (KITS) IMPLANT
MANIFOLD NEPTUNE II (INSTRUMENTS) ×1 IMPLANT
NEEDLE HYPO 22GX1.5 SAFETY (NEEDLE) ×2 IMPLANT
NS IRRIG 1000ML POUR BTL (IV SOLUTION) ×1 IMPLANT
PACK TOTAL KNEE CUSTOM (KITS) ×1 IMPLANT
PIN STEINMAN FIXATION KNEE (PIN) IMPLANT
PROTECTOR NERVE ULNAR (MISCELLANEOUS) ×1 IMPLANT
SET HNDPC FAN SPRY TIP SCT (DISPOSABLE) ×1 IMPLANT
SPIKE FLUID TRANSFER (MISCELLANEOUS) ×2 IMPLANT
SUT ETHIBOND NAB CT1 #1 30IN (SUTURE) ×2 IMPLANT
SUT MNCRL AB 3-0 PS2 18 (SUTURE) ×1 IMPLANT
SUT VIC AB 0 CT1 36 (SUTURE) ×1 IMPLANT
SUT VIC AB 2-0 CT1 27 (SUTURE) ×2
SUT VIC AB 2-0 CT1 TAPERPNT 27 (SUTURE) ×2 IMPLANT
SYR CONTROL 10ML LL (SYRINGE) ×3 IMPLANT
TIBIAL BASE ROT PLAT SZ 5 KNEE (Knees) ×1 IMPLANT
TOWEL OR 17X26 10 PK STRL BLUE (TOWEL DISPOSABLE) ×1 IMPLANT
TRAY FOLEY MTR SLVR 16FR STAT (SET/KITS/TRAYS/PACK) ×1 IMPLANT
TUBE SUCTION HIGH CAP CLEAR NV (SUCTIONS) ×1 IMPLANT
WATER STERILE IRR 1000ML POUR (IV SOLUTION) ×2 IMPLANT
WRAP KNEE MAXI GEL POST OP (GAUZE/BANDAGES/DRESSINGS) ×1 IMPLANT

## 2022-03-31 NOTE — Transfer of Care (Signed)
Immediate Anesthesia Transfer of Care Note  Patient: Amanda Hammond  Procedure(s) Performed: Procedure(s): TOTAL KNEE ARTHROPLASTY (Right)  Patient Location: PACU  Anesthesia Type:MAC, Regional, and Spinal  Level of Consciousness: Patient easily awoken, sedated, comfortable, cooperative, following commands, responds to stimulation.   Airway & Oxygen Therapy: Patient spontaneously breathing, ventilating well, oxygen via simple oxygen mask.  Post-op Assessment: Report given to PACU RN, vital signs reviewed and stable, moving all extremities.   Post vital signs: Reviewed and stable.  Complications: No apparent anesthesia complications  Last Vitals:  Vitals Value Taken Time  BP 115/74 03/31/22 0954  Temp    Pulse 84 03/31/22 0955  Resp 17 03/31/22 0955  SpO2 99 % 03/31/22 0955  Vitals shown include unvalidated device data.  Last Pain:  Vitals:   03/31/22 0601  TempSrc:   PainSc: 9          Complications: No notable events documented.

## 2022-03-31 NOTE — Anesthesia Procedure Notes (Addendum)
Procedure Name: MAC Date/Time: 03/31/2022 7:50 AM  Performed by: Deliah Boston, CRNAPre-anesthesia Checklist: Patient identified, Emergency Drugs available, Suction available and Patient being monitored Patient Re-evaluated:Patient Re-evaluated prior to induction Oxygen Delivery Method: Simple face mask

## 2022-03-31 NOTE — Anesthesia Procedure Notes (Signed)
Spinal  Patient location during procedure: OR Reason for block: surgical anesthesia Staffing Performed: anesthesiologist  Anesthesiologist: Cadence Minton E, MD Performed by: Suheily Birks E, MD Authorized by: Shevaun Lovan E, MD   Preanesthetic Checklist Completed: patient identified, IV checked, risks and benefits discussed, surgical consent, monitors and equipment checked, pre-op evaluation and timeout performed Spinal Block Patient position: sitting Prep: DuraPrep and site prepped and draped Patient monitoring: continuous pulse ox, blood pressure and heart rate Approach: midline Location: L3-4 Injection technique: single-shot Needle Needle type: Pencan  Needle gauge: 24 G Needle length: 10 cm Assessment Events: CSF return Additional Notes Functioning IV was confirmed and monitors were applied. Sterile prep and drape, including hand hygiene and sterile gloves were used. The patient was positioned and the spine was prepped. The skin was anesthetized with lidocaine.  Free flow of clear CSF was obtained prior to injecting local anesthetic into the CSF. The needle was carefully withdrawn. The patient tolerated the procedure well.      

## 2022-03-31 NOTE — Discharge Instructions (Signed)

## 2022-03-31 NOTE — Anesthesia Postprocedure Evaluation (Signed)
Anesthesia Post Note  Patient: ALEIDA CRANDELL  Procedure(s) Performed: TOTAL KNEE ARTHROPLASTY (Right: Knee)     Patient location during evaluation: PACU Anesthesia Type: Spinal Level of consciousness: oriented and awake and alert Pain management: pain level controlled Vital Signs Assessment: post-procedure vital signs reviewed and stable Respiratory status: spontaneous breathing, respiratory function stable and nonlabored ventilation Cardiovascular status: blood pressure returned to baseline and stable Postop Assessment: no headache, no backache, no apparent nausea or vomiting and spinal receding Anesthetic complications: no   No notable events documented.  Last Vitals:  Vitals:   03/31/22 1315 03/31/22 1330  BP: (!) 132/93 (!) 132/92  Pulse: 64 72  Resp: 20   Temp: 36.6 C   SpO2: 94% 97%    Last Pain:  Vitals:   03/31/22 1445  TempSrc:   PainSc: 0-No pain                 Lucretia Kern

## 2022-03-31 NOTE — Brief Op Note (Signed)
03/31/2022  10:08 AM  PATIENT:  Amanda Hammond  51 y.o. female  PRE-OPERATIVE DIAGNOSIS:  OA RIGHT KNEE  POST-OPERATIVE DIAGNOSIS:  OA RIGHT KNEE  PROCEDURE:  Procedure(s): TOTAL KNEE ARTHROPLASTY (Right)  SURGEON:  Surgeon(s) and Role:    Frederico Hamman, MD - Primary  PHYSICIAN ASSISTANT: Margart Sickles, PA-C  ASSISTANTS: OR staff x1   ANESTHESIA:   local, regional, spinal, and IV sedation  EBL:  25 mL   BLOOD ADMINISTERED:none  DRAINS: none   LOCAL MEDICATIONS USED:  MARCAINE     SPECIMEN:  No Specimen  DISPOSITION OF SPECIMEN:  N/A  COUNTS:  YES  TOURNIQUET:   Total Tourniquet Time Documented: Thigh (Right) - 52 minutes Total: Thigh (Right) - 52 minutes   DICTATION: .Other Dictation: Dictation Number unknown  PLAN OF CARE: Discharge to home after PACU  PATIENT DISPOSITION:  PACU - hemodynamically stable.   Delay start of Pharmacological VTE agent (>24hrs) due to surgical blood loss or risk of bleeding: yes

## 2022-03-31 NOTE — Evaluation (Signed)
Physical Therapy Evaluation Patient Details Name: Amanda Hammond MRN: [id number] DOB: 1970-12-03 Today's Date: 03/31/2022  History of Present Illness  Pt is a 51yo female presenting s/p R-TKA on 03/31/22. PMH: anemia, anxiety & depression, fibromyalgia, HTN, OA, PNA, RA,  Clinical Impression  Amanda Hammond is a 51 y.o. female POD 0 s/p R-TKA. Patient reports independence with mobility at baseline. Patient is now limited by functional impairments (see PT problem list below) and requires min guard for transfers and gait with RW. Patient was able to ambulate 50 feet with RW and min guard and cues for safe walker management. Patient educated on safe sequencing for stair mobility and verbalized safe guarding position for people assisting with mobility. Patient instructed in exercises to facilitate ROM and circulation. Patient will benefit from continued skilled PT interventions to address impairments and progress towards PLOF. Patient has met mobility goals at adequate level for discharge home; will continue to follow if pt continues acute stay to progress towards Mod I goals.       Recommendations for follow up therapy are one component of a multi-disciplinary discharge planning process, led by the attending physician.  Recommendations may be updated based on patient status, additional functional criteria and insurance authorization.  Follow Up Recommendations Follow physician's recommendations for discharge plan and follow up therapies      Assistance Recommended at Discharge Frequent or constant Supervision/Assistance  Patient can return home with the following  A little help with walking and/or transfers;A little help with bathing/dressing/bathroom;Assistance with cooking/housework;Assist for transportation;Help with stairs or ramp for entrance    Equipment Recommendations None recommended by PT  Recommendations for Other Services       Functional Status Assessment Patient has had a  recent decline in their functional status and demonstrates the ability to make significant improvements in function in a reasonable and predictable amount of time.     Precautions / Restrictions Precautions Precautions: Knee Precaution Booklet Issued: No Precaution Comments: no pillow under the knee Restrictions Weight Bearing Restrictions: No Other Position/Activity Restrictions: wbat      Mobility  Bed Mobility Overal bed mobility: Needs Assistance Bed Mobility: Supine to Sit     Supine to sit: Supervision     General bed mobility comments: Pt required supervision for safety    Transfers Overall transfer level: Needs assistance Equipment used: Rolling walker (2 wheels) Transfers: Sit to/from Stand Sit to Stand: Min guard           General transfer comment: For safety, VC for sequencing    Ambulation/Gait Ambulation/Gait assistance: Min guard Gait Distance (Feet): 50 Feet Assistive device: Rolling walker (2 wheels) Gait Pattern/deviations: Step-to pattern, Decreased stance time - right Gait velocity: decreased     General Gait Details: Pt ambulated with RW and min guard, no phyasical assist req or overt LOB noted. Single seated rest break ~6mn  Stairs Stairs: Yes Stairs assistance: Min assist Stair Management: No rails, Step to pattern, Backwards, With walker Number of Stairs: 2 General stair comments: Pt completed stair training with min assist for steadying of RW, caregiver providing min guard as well. No overt LOB noted  Wheelchair Mobility    Modified Rankin (Stroke Patients Only)       Balance Overall balance assessment: Needs assistance Sitting-balance support: Feet supported, No upper extremity supported Sitting balance-Leahy Scale: Good     Standing balance support: Reliant on assistive device for balance, During functional activity, Bilateral upper extremity supported Standing balance-Leahy Scale: Poor  Pertinent Vitals/Pain Pain Assessment Pain Assessment: 0-10 Pain Score: 9  Pain Location: right knee Pain Descriptors / Indicators: Operative site guarding Pain Intervention(s): Limited activity within patient's tolerance, Monitored during session, Repositioned, Ice applied    Home Living Family/patient expects to be discharged to:: Private residence Living Arrangements: Spouse/significant other Available Help at Discharge: Family;Available 24 hours/day Type of Home: House Home Access: Stairs to enter Entrance Stairs-Rails: None Entrance Stairs-Number of Steps: 3   Home Layout: One level Home Equipment: Conservation officer, nature (2 wheels)      Prior Function Prior Level of Function : Independent/Modified Independent             Mobility Comments: IND ADLs Comments: IND     Hand Dominance        Extremity/Trunk Assessment   Upper Extremity Assessment Upper Extremity Assessment: Overall WFL for tasks assessed    Lower Extremity Assessment Lower Extremity Assessment: RLE deficits/detail;LLE deficits/detail RLE Deficits / Details: MMT ank DF/PF 3+/5, no extensor lag noted RLE Sensation: decreased light touch LLE Deficits / Details: MMT ank DF/PF 4/5 LLE Sensation: WNL    Cervical / Trunk Assessment Cervical / Trunk Assessment: Normal  Communication   Communication: No difficulties  Cognition Arousal/Alertness: Awake/alert Behavior During Therapy: WFL for tasks assessed/performed Overall Cognitive Status: Within Functional Limits for tasks assessed                                          General Comments General comments (skin integrity, edema, etc.): Husband present    Exercises Total Joint Exercises Ankle Circles/Pumps: AROM, Both, 20 reps Quad Sets: AROM, Right, Other reps (comment) (2) Short Arc Quad: AROM, Right, Other reps (comment) (2) Heel Slides: AROM, Right, Other reps (comment) (2) Hip ABduction/ADduction: AROM, Other reps  (comment), Right (3) Straight Leg Raises: AROM, Right, Other reps (comment) (2) Goniometric ROM: -5-40deg by gross visual approximation   Assessment/Plan    PT Assessment Patient needs continued PT services  PT Problem List Decreased strength;Decreased range of motion;Decreased activity tolerance;Decreased balance;Decreased mobility;Decreased coordination;Pain       PT Treatment Interventions DME instruction;Gait training;Stair training;Functional mobility training;Therapeutic activities;Therapeutic exercise;Balance training;Neuromuscular re-education;Patient/family education    PT Goals (Current goals can be found in the Care Plan section)  Acute Rehab PT Goals Patient Stated Goal: To go home PT Goal Formulation: With patient Time For Goal Achievement: 04/07/22 Potential to Achieve Goals: Good    Frequency 7X/week     Co-evaluation               AM-PAC PT "6 Clicks" Mobility  Outcome Measure Help needed turning from your back to your side while in a flat bed without using bedrails?: None Help needed moving from lying on your back to sitting on the side of a flat bed without using bedrails?: A Little Help needed moving to and from a bed to a chair (including a wheelchair)?: A Little Help needed standing up from a chair using your arms (e.g., wheelchair or bedside chair)?: A Little Help needed to walk in hospital room?: A Little Help needed climbing 3-5 steps with a railing? : A Little 6 Click Score: 19    End of Session Equipment Utilized During Treatment: Gait belt Activity Tolerance: Patient tolerated treatment well;Patient limited by pain Patient left: in chair;with call bell/phone within reach;with family/visitor present Nurse Communication: Mobility status PT Visit Diagnosis: Pain;Difficulty in walking, not  elsewhere classified (R26.2) Pain - Right/Left: Right Pain - part of body: Knee    Time: 3536-1443 PT Time Calculation (min) (ACUTE ONLY): 53  min   Charges:   PT Evaluation $PT Eval Low Complexity: 1 Low PT Treatments $Gait Training: 8-22 mins $Therapeutic Exercise: 8-22 mins        Coolidge Breeze, PT, DPT [facility] Department Office: [phone] Weekend pager: [phone]  Coolidge Breeze 03/31/2022, 2:24 PM

## 2022-03-31 NOTE — Interval H&P Note (Signed)
History and Physical Interval Note:  03/31/2022 7:34 AM  Amanda Hammond  has presented today for surgery, with the diagnosis of OA RIGHT KNEE.  The various methods of treatment have been discussed with the patient and family. After consideration of risks, benefits and other options for treatment, the patient has consented to  Procedure(s): TOTAL KNEE ARTHROPLASTY (Right) as a surgical intervention.  The patient's history has been reviewed, patient examined, no change in status, stable for surgery.  I have reviewed the patient's chart and labs.  Questions were answered to the patient's satisfaction.     Thera Flake

## 2022-03-31 NOTE — Op Note (Signed)
NAME: Amanda Hammond, Amanda Hammond MEDICAL RECORD NO: [id number] ACCOUNT NO: [id number] DATE OF BIRTH: Jul 27, 1970 FACILITY: [facility] LOCATION: [facility] PHYSICIAN: W D. Carloyn Manner., MD  Operative Report   DATE OF PROCEDURE: 03/31/2022  PREOPERATIVE DIAGNOSIS:  Severe osteoarthritis, right knee.  POSTOPERATIVE DIAGNOSIS:  Severe osteoarthritis, right knee.  PROCEDURE:  Right total knee replacement (Attune cemented knee size 4 femur, 6 mm insert, size 5 tibia with 38 mm all poly patella).  SURGEON:  Thera Flake., MD  ASSISTANTVincent Peyer.  ANESTHESIA:  Spinal with block.  TOURNIQUET TIME:  50 minutes.  DESCRIPTION OF PROCEDURE:  Straight skin incision with a medial parapatellar approach to the knee made after exsanguination of the leg, inflation of thigh tourniquet to 350.  We did a provisional distal femoral cut of 10 mm, having to take an additional  1 mm due to bone deficiency on the lateral side of the knee.  We then cut the tibia about 3 mm below the most diseased lateral compartment with the extension gap measured at 6 mm.  Femur was sized to be a size 4, placement of all-in-one cutting block in  the appropriate degree of external rotation, accomplishing the anterior, posterior and chamfer cuts.  PCL was released.  Osteophytes were removed from the posterior aspect of the knee and the capsular and subcutaneous tissues were infiltrated with a  mixture of Marcaine and Exparel.  Tibia was sized to be a size 5, keel cut on the tibia was made and the box cut on the femur, placement of the femoral and tibial trials with a 6 mm bearing with good alignment noted, full extension and good balancing of  the ligaments.  9.5 mm of patella was resected and placement of a 38 mm all poly trial.  Cement was prepared on the back table with antibiotic-impregnated cement due to the patient's history of rheumatoid arthritis and MRSA positive swab.  Cement was  allowed to harden with the trial bearing in and the trial  bearing was removed.  Excess bits of cement were removed. Under direct vision, the tourniquet was released.  No excessive bleeding was noted.  Small bleeders were coagulated.  The final bearing  was placed.  Closure on the capsule was affected with #1 Ethibond and Vicryl and Monocryl on the subcutaneous tissues and skin.  Taken to recovery room in stable condition.   SHW D: 03/31/2022 9:21:17 am T: 03/31/2022 10:02:00 am  JOB: [id number]/ [id number]

## 2022-03-31 NOTE — Anesthesia Procedure Notes (Signed)
Anesthesia Regional Block: Adductor canal block   Pre-Anesthetic Checklist: , timeout performed,  Correct Patient, Correct Site, Correct Laterality,  Correct Procedure, Correct Position, site marked,  Risks and benefits discussed,  Surgical consent,  Pre-op evaluation,  At surgeon's request and post-op pain management  Laterality: Right  Prep: chloraprep       Needles:  Injection technique: Single-shot  Needle Type: Echogenic Stimulator Needle     Needle Length: 10cm  Needle Gauge: 20     Additional Needles:   Procedures:,,,, ultrasound used (permanent image in chart),,    Narrative:  Start time: 03/31/2022 7:10 AM End time: 03/31/2022 7:14 AM Injection made incrementally with aspirations every 5 mL.  Performed by: Personally  Anesthesiologist: Lucretia Kern, MD  Additional Notes: Standard monitors applied. Skin prepped. Good needle visualization with ultrasound. Injection made in 5cc increments with no resistance to injection. Patient tolerated the procedure well.

## 2022-04-03 DIAGNOSIS — M1711 Unilateral primary osteoarthritis, right knee: Secondary | ICD-10-CM | POA: Diagnosis not present

## 2022-04-03 DIAGNOSIS — Z96651 Presence of right artificial knee joint: Secondary | ICD-10-CM | POA: Diagnosis not present

## 2022-04-04 ENCOUNTER — Encounter (HOSPITAL_COMMUNITY): Payer: Self-pay | Admitting: Orthopedic Surgery

## 2022-04-04 DIAGNOSIS — M25561 Pain in right knee: Secondary | ICD-10-CM | POA: Diagnosis not present

## 2022-04-04 NOTE — Progress Notes (Signed)
TB gold negative

## 2022-04-10 DIAGNOSIS — M25561 Pain in right knee: Secondary | ICD-10-CM | POA: Diagnosis not present

## 2022-04-14 DIAGNOSIS — M1711 Unilateral primary osteoarthritis, right knee: Secondary | ICD-10-CM | POA: Diagnosis not present

## 2022-04-17 DIAGNOSIS — M25561 Pain in right knee: Secondary | ICD-10-CM | POA: Diagnosis not present

## 2022-04-18 ENCOUNTER — Other Ambulatory Visit (HOSPITAL_COMMUNITY): Payer: Self-pay

## 2022-04-18 NOTE — Telephone Encounter (Signed)
Ran test claim for [employer]. Copay is $1825.98. Will need to pursue appeal through [employer]. ATC patient but unable to reach. Left VM.  Will likely need documents from patient as well.  Screenshot of test claim and insurance information printed today to submit with appeal.  Submitted a Prior Authorization request to [facility] (new plan) for [facility] via CoverMyMeds. Will update once we receive a response.  Key: [id number]  Knox Saliva, PharmD, MPH, BCPS, CPP Clinical Pharmacist (Rheumatology and Pulmonology)

## 2022-04-18 NOTE — Telephone Encounter (Signed)
Received notification from [facility] regarding a prior authorization for [facility]. Authorization has been APPROVED from 04/18/2022 to 10/17/2022. Approval letter sent to scan center. Attached approval to appeal documents that have been collected thus far  Authorization # [id number]  Knox Saliva, PharmD, MPH, BCPS, CPP Clinical Pharmacist (Rheumatology and Pulmonology)

## 2022-04-21 DIAGNOSIS — M069 Rheumatoid arthritis, unspecified: Secondary | ICD-10-CM | POA: Diagnosis not present

## 2022-04-21 DIAGNOSIS — M1711 Unilateral primary osteoarthritis, right knee: Secondary | ICD-10-CM | POA: Diagnosis not present

## 2022-04-21 DIAGNOSIS — M25561 Pain in right knee: Secondary | ICD-10-CM | POA: Diagnosis not present

## 2022-04-21 DIAGNOSIS — M545 Low back pain, unspecified: Secondary | ICD-10-CM | POA: Diagnosis not present

## 2022-04-21 DIAGNOSIS — M5412 Radiculopathy, cervical region: Secondary | ICD-10-CM | POA: Diagnosis not present

## 2022-04-21 DIAGNOSIS — G894 Chronic pain syndrome: Secondary | ICD-10-CM | POA: Diagnosis not present

## 2022-04-21 DIAGNOSIS — M47816 Spondylosis without myelopathy or radiculopathy, lumbar region: Secondary | ICD-10-CM | POA: Diagnosis not present

## 2022-04-21 DIAGNOSIS — Z79891 Long term (current) use of opiate analgesic: Secondary | ICD-10-CM | POA: Diagnosis not present

## 2022-04-21 DIAGNOSIS — G8929 Other chronic pain: Secondary | ICD-10-CM | POA: Diagnosis not present

## 2022-04-26 DIAGNOSIS — M25561 Pain in right knee: Secondary | ICD-10-CM | POA: Diagnosis not present

## 2022-05-03 NOTE — Telephone Encounter (Signed)
Spoke with the patient. She stated that she does not have any sort of income subsidy at this time. Provided phone number for the [employer] patient assistance program. Patient plans to call to program to address this and will reach back out to let us know what she finds out.   Maryan Puls, PharmD PGY-1 [facility] Resident

## 2022-05-10 NOTE — Telephone Encounter (Signed)
Called [employer] to determine where they got notification that patient has LIS as she has confirmed she does not. Per rep, they meant to write that patient needs to apply for LIS and receive denial to qualify for [employer] PAP.  LIS Phone: [phone]  ATC patient. Unable to reach. Left detailed VM. MyChart message sent to patient and advised patient to apply ASAP  Knox Saliva, PharmD, MPH, BCPS, CPP Clinical Pharmacist (Rheumatology and Pulmonology)

## 2022-05-17 NOTE — Telephone Encounter (Signed)
Spoke with the patient and informed her that she will need to apply for LIS. Provided the website information and told her to let us know the outcome. If DENIED, she will need to provide [place] with the denial letter.   Patient stated she would work on this and get back to [place].  Maryan Puls, PharmD PGY-1 [facility] Resident

## 2022-05-26 DIAGNOSIS — M069 Rheumatoid arthritis, unspecified: Secondary | ICD-10-CM | POA: Diagnosis not present

## 2022-05-26 DIAGNOSIS — G894 Chronic pain syndrome: Secondary | ICD-10-CM | POA: Diagnosis not present

## 2022-05-26 DIAGNOSIS — M47816 Spondylosis without myelopathy or radiculopathy, lumbar region: Secondary | ICD-10-CM | POA: Diagnosis not present

## 2022-05-26 DIAGNOSIS — G43709 Chronic migraine without aura, not intractable, without status migrainosus: Secondary | ICD-10-CM | POA: Diagnosis not present

## 2022-05-26 DIAGNOSIS — M797 Fibromyalgia: Secondary | ICD-10-CM | POA: Diagnosis not present

## 2022-06-01 ENCOUNTER — Encounter: Payer: Self-pay | Admitting: Radiology

## 2022-06-02 ENCOUNTER — Other Ambulatory Visit: Payer: Self-pay | Admitting: Rheumatology

## 2022-06-02 NOTE — Telephone Encounter (Signed)
Next Visit: 06/28/2022  Last Visit: 03/29/2022  Last Fill: 03/29/2022  DX: Seronegative rheumatoid arthritis   Current Dose per office note 03/29/2022: Arava 20 mg daily   Labs: 03/22/2022 Albumin 3.4, Hgb 11.5, MCV 76.3, MCH 23.7, RDW  Okay to refill Arava?

## 2022-06-05 ENCOUNTER — Encounter: Payer: Self-pay | Admitting: *Deleted

## 2022-06-05 ENCOUNTER — Other Ambulatory Visit: Payer: Self-pay | Admitting: Rheumatology

## 2022-06-05 NOTE — Telephone Encounter (Signed)
Next Visit: 06/28/2022   Last Visit: 03/29/2022   Last Fill: 02/21/2022  DX: Seronegative rheumatoid arthritis    Current Dose per office note 03/29/2022: Plaquenil 200 mg 1 tablet by mouth twice daily.   Labs: 03/22/2022 Albumin 3.4, Hgb 11.5, MCV 76.3, MCH 23.7, RDW  PLQ Eye Exam: not on file   Okay to refill PLQ?

## 2022-06-14 DIAGNOSIS — M47814 Spondylosis without myelopathy or radiculopathy, thoracic region: Secondary | ICD-10-CM | POA: Diagnosis not present

## 2022-06-14 DIAGNOSIS — M47816 Spondylosis without myelopathy or radiculopathy, lumbar region: Secondary | ICD-10-CM | POA: Diagnosis not present

## 2022-06-22 NOTE — Telephone Encounter (Signed)
Received a fax from  [employer] regarding an approval for [facility] patient assistance from 06/21/2022 to 04/03/2023. Approval letter sent to scan center.  Phone #: [phone] Fax #: [phone]  Knox Saliva, PharmD, MPH, BCPS, CPP Clinical Pharmacist (Rheumatology and Pulmonology)

## 2022-06-23 DIAGNOSIS — M1711 Unilateral primary osteoarthritis, right knee: Secondary | ICD-10-CM | POA: Diagnosis not present

## 2022-06-27 DIAGNOSIS — F32A Depression, unspecified: Secondary | ICD-10-CM | POA: Diagnosis not present

## 2022-06-27 DIAGNOSIS — I10 Essential (primary) hypertension: Secondary | ICD-10-CM | POA: Diagnosis not present

## 2022-06-27 DIAGNOSIS — G4700 Insomnia, unspecified: Secondary | ICD-10-CM | POA: Diagnosis not present

## 2022-06-27 DIAGNOSIS — G43009 Migraine without aura, not intractable, without status migrainosus: Secondary | ICD-10-CM | POA: Diagnosis not present

## 2022-06-27 DIAGNOSIS — M797 Fibromyalgia: Secondary | ICD-10-CM | POA: Diagnosis not present

## 2022-06-27 DIAGNOSIS — Z7689 Persons encountering health services in other specified circumstances: Secondary | ICD-10-CM | POA: Diagnosis not present

## 2022-06-27 DIAGNOSIS — G2581 Restless legs syndrome: Secondary | ICD-10-CM | POA: Diagnosis not present

## 2022-06-27 DIAGNOSIS — M069 Rheumatoid arthritis, unspecified: Secondary | ICD-10-CM | POA: Diagnosis not present

## 2022-06-27 NOTE — Progress Notes (Unsigned)
Office Visit Note  Patient: Amanda Hammond             Date of Birth: [birth year]           MRN: [id number]             PCP: Fonnie Birkenhead Referring: Luciano Cutter, DO Visit Date: 06/28/2022 Occupation: @GUAROCC @  Subjective:  Trapezius muscle spasms   History of Present Illness: Amanda Hammond is a 52 y.o. female with history of rheumatoid arthritis, osteoarthritis, and fibromyalgia. She remains on Orencia 125 mg subcutaneous injections once weekly, Arava 20 mg daily, and Plaquenil 200 mg 1 tablet by mouth twice daily.  She is tolerating combination therapy without any side effects.  She has been able to resume her medications consistently and has not missed any doses recently.  She would like to continue on triple therapy as prescribed for now prior to making any medication changes.  She continues to have some intermittent pain and swelling in both hands.  She has had increased discomfort in the left first MCP joint recently.  Patient reports that she has noticed an 80% improvement in her right knee pain since undergoing a right knee replacement on 03/31/2022.  She has completed physical therapy. She continues to have ongoing myalgias and muscle tenderness due to fibromyalgia.  She presents today with trapezius muscle tension tenderness bilaterally.  She requested trigger point injections today.She remains on lyrica as prescribed and takes zanaflex 4 mg up to 4 times daily as needed for muscle spasms.  She has been taking oxycodone for pain relief.   Activities of Daily Living:  Patient reports morning stiffness for 4 hours.   Patient Reports nocturnal pain.  Difficulty dressing/grooming: Reports Difficulty climbing stairs: Reports Difficulty getting out of chair: Reports Difficulty using hands for taps, buttons, cutlery, and/or writing: Denies  Review of Systems  Constitutional:  Positive for fatigue.  HENT:  Positive for mouth dryness. Negative for mouth sores.   Eyes:   Negative for dryness.  Respiratory:  Negative for shortness of breath.   Cardiovascular:  Negative for chest pain and palpitations.  Gastrointestinal:  Negative for blood in stool, constipation and diarrhea.  Endocrine: Negative for increased urination.  Genitourinary:  Negative for involuntary urination.  Musculoskeletal:  Positive for joint pain, joint pain, joint swelling, myalgias, morning stiffness and myalgias. Negative for gait problem, muscle weakness and muscle tenderness.  Skin:  Negative for color change, rash, hair loss and sensitivity to sunlight.  Allergic/Immunologic: Negative for susceptible to infections.  Neurological:  Positive for dizziness and headaches.  Hematological:  Negative for swollen glands.  Psychiatric/Behavioral:  Positive for depressed mood and sleep disturbance. The patient is nervous/anxious.     PMFS History:  Patient Active Problem List   Diagnosis Date Noted   Rheumatoid factor positive 11/06/2016   Trochanteric bursitis of both hips 11/06/2016   History of fibromyalgia 11/06/2016   DDD (degenerative disc disease), cervical 11/06/2016   Carpal tunnel syndrome, left upper limb 11/06/2016   Carpal tunnel syndrome, right upper limb 11/06/2016   Trigger finger, left middle finger 11/06/2016   Major depressive disorder, recurrent episode, moderate ([place]) 04/27/2016   Pain in left hand 04/25/2016   Pain in right hand 04/25/2016   Primary osteoarthritis of both hands 04/25/2016   Primary osteoarthritis of both knees 04/25/2016   Primary osteoarthritis of both feet 04/25/2016   Fibromyalgia 02/17/2016   Other insomnia 02/17/2016   Fatigue 02/17/2016   B12 DEFICIENCY 02/01/2010  Vitamin D deficiency 02/01/2010   ANEMIA, IRON DEFICIENCY 12/30/2009   OTHER DYSPNEA AND RESPIRATORY ABNORMALITIES 12/30/2009   Nonspecific (abnormal) findings on radiological and other examination of body structure 12/30/2009   COMPUTERIZED TOMOGRAPHY, CHEST, ABNORMAL  12/30/2009    Past Medical History:  Diagnosis Date   Anemia    Anxiety    Depression    Fibromyalgia    Hypertension    Osteoarthritis    Pneumonia    PONV (postoperative nausea and vomiting)    Rheumatoid arthritis ([place])    Vitamin D deficiency     Family History  Problem Relation Age of Onset   Diabetes Mother    Hypertension Mother    Hypertension Father    Hypertension Brother    Osteoarthritis Brother    Hypertension Brother    Osteoarthritis Brother    Endometriosis Daughter    Anesthesia problems Neg Hx    Hypotension Neg Hx    Pseudochol deficiency Neg Hx    Malignant hyperthermia Neg Hx    Past Surgical History:  Procedure Laterality Date   APPENDECTOMY  2010   [place]   CARPAL TUNNEL RELEASE Left    CHOLECYSTECTOMY  1999   lap, [place]   GASTRIC BYPASS  2003   [facility]   HERNIA REPAIR  08/2010, 02/2010    incisional, [facility], [place]   INCISIONAL HERNIA REPAIR  10/12/2010   Procedure: HERNIA REPAIR INCISIONAL;  Surgeon: Jamesetta So;  Location: [facility];  Service: General;  Laterality: N/A;  Recurrent Incisional Hernia Repair with Mesh   KNEE SURGERY Right 01/2018   meniscal tear    KNEE SURGERY Right 12/2019   PANNICULECTOMY  2008   [facility]   SHOULDER SURGERY Left    TENNIS ELBOW RELEASE/NIRSCHEL PROCEDURE Bilateral    TOTAL KNEE ARTHROPLASTY Right 03/31/2022   Procedure: TOTAL KNEE ARTHROPLASTY;  Surgeon: Earlie Server, MD;  Location: [facility];  Service: Orthopedics;  Laterality: Right;   Social History   Social History Narrative   Not on file    There is no immunization history on file for this patient.   Objective: Vital Signs: BP 117/79 (BP Location: Left Arm, Patient Position: Sitting, Cuff Size: Small)   Pulse 85   Resp 12   Ht 5\' 5"  (1.651 m)   Wt 221 lb 12.8 oz (100.6 kg)   BMI 36.91 kg/m    Physical Exam Vitals and nursing note reviewed.  Constitutional:      Appearance: She is well-developed.  HENT:     Head: Normocephalic and atraumatic.   Eyes:     Conjunctiva/sclera: Conjunctivae normal.  Cardiovascular:     Rate and Rhythm: Normal rate and regular rhythm.     Heart sounds: Normal heart sounds.  Pulmonary:     Effort: Pulmonary effort is normal.     Breath sounds: Normal breath sounds.  Abdominal:     General: Bowel sounds are normal.     Palpations: Abdomen is soft.  Musculoskeletal:     Cervical back: Normal range of motion.  Lymphadenopathy:     Cervical: No cervical adenopathy.  Skin:    General: Skin is warm and dry.     Capillary Refill: Capillary refill takes less than 2 seconds.  Neurological:     Mental Status: She is alert and oriented to person, place, and time.  Psychiatric:        Behavior: Behavior normal.      Musculoskeletal Exam: Generalized hyperalgesia and positive tender points on exam.  C-spine has painful range of motion.  Trapezius muscle tension and tenderness bilaterally.  Shoulder joints have painful range of motion.  Tenderness over the deltoid insertion site bilaterally.  Elbow joints and wrist joints have good range of motion with no tenderness or synovitis.  Synovial thickening over the right second and third MCP joints.  Tenderness and synovitis of the left first MCP joint.  PIP and DIP thickening consistent with osteoarthritis of both hands.  Hip joints have good range of motion with no groin pain.  Tenderness over the trochanteric bursa bilaterally.  Right knee replacement has good range of motion with some warmth.  Left knee joint has good range of motion with no warmth or effusion.  Ankle joints have good range of motion with no tenderness or synovitis.  CDAI Exam: CDAI Score: 3.3  Patient Global: 8 mm; Provider Global: 5 mm Swollen: 1 ; Tender: 1  Joint Exam 06/28/2022      Right  Left  MCP 1     Swollen Tender     Investigation: No additional findings.  Imaging: No results found.  Recent Labs: Lab Results  Component Value Date   WBC 6.4 03/22/2022   HGB 11.5 (L)  03/22/2022   PLT 300 03/22/2022   NA 135 03/22/2022   K 4.1 03/22/2022   CL 100 03/22/2022   CO2 27 03/22/2022   GLUCOSE 95 03/22/2022   BUN 12 03/22/2022   CREATININE 0.87 03/22/2022   BILITOT 0.5 03/22/2022   ALKPHOS 87 03/22/2022   AST 19 03/22/2022   ALT 17 03/22/2022   PROT 6.8 03/22/2022   ALBUMIN 3.4 (L) 03/22/2022   CALCIUM 9.0 03/22/2022   GFRAA 67 09/02/2020   QFTBGOLDPLUS NEGATIVE 03/29/2022    Speciality Comments: Orencia started 08/11/21  Patient has PLQ eye exam scheduled for 05/03/2022 at [facility]  Procedures:  Trigger Point Inj  Date/Time: 06/28/2022 2:08 PM  Performed by: Ofilia Neas, PA-C Authorized by: Ofilia Neas, PA-C   Consent Given by:  Patient Site marked: the procedure site was marked   Timeout: prior to procedure the correct patient, procedure, and site was verified   Indications:  Pain Total # of Trigger Points:  2 Location: neck   Needle Size:  27 G Approach:  Dorsal Medications #1:  0.5 mL lidocaine 1 %; 10 mg triamcinolone acetonide 40 MG/ML Medications #2:  0.5 mL lidocaine 1 %; 10 mg triamcinolone acetonide 40 MG/ML Patient tolerance:  Patient tolerated the procedure well with no immediate complications  Allergies: Patient has no known allergies.   Assessment / Plan:     Visit Diagnoses: Seronegative rheumatoid arthritis [facility]): Patient presents today with tenderness and synovitis of the left first MCP joint.  Synovial thickening of the right second and third MCP joints noted.  She has had multiple interruptions in therapy.  She has started to notice improvement in her symptoms since being able to take triple therapy as prescribed without interruption.  She remains on Orencia 125 mg subcutaneous injections once weekly, Arava 20 mg 1 tablet by mouth daily, Plaquenil 200 mg 1 tablet by mouth twice daily.  She has been tolerating combination therapy without any side effects.  She is hesitant to make any medication changes at this  time and would like to give the current combination more time and for [place] to reassess for the Hammond efficacy once she has been able to remain on therapy without interruption.  She will follow-up in the office in 3  months or sooner if needed.  High risk medication use - Orencia 125 mg subcutaneous injections once weekly, Arava 20 mg daily, and Plaquenil 200 mg 1 tablet by mouth twice daily.  TB gold negative on 03/29/22 CBC and CMP updated on 03/22/22. Orders for CBC and CMP released today. Her next lab work will be due in June and every 3 months.  She has not had any recent or recurrent infections.  Discussed the importance of holding orencia if she develops signs or symptoms of an infection and to resume once the infection has completely cleared PLQ eye exam scheduled for 05/03/2022 at [place]: CBC with Differential/Platelet, COMPLETE METABOLIC PANEL WITH GFR  Primary osteoarthritis of both hands: She has PIP and DIP thickening consistent with osteoarthritis of both hands.  CMC joint prominence noted bilaterally.  She continues to experience intermittent pain and stiffness in both hands but has noticed gradual improvement on triple therapy in regards to management of her rheumatoid arthritis.  Carpal tunnel syndrome, right upper limb: Currently asymptomatic.  Trochanteric bursitis of both hips: She has tenderness palpation over bilateral trochanteric bursa.  Primary osteoarthritis of left knee: She has good range of motion of the left knee with no discomfort at this time.  No warmth or effusion noted.  S/P total knee arthroplasty, right - Dr. Lorenz Coaster 03/31/22: Doing well.  Completed physical therapy.  She has noticed an 80% improvement in her right knee pain since undergoing the right total knee arthroplasty.  Primary osteoarthritis of both feet: She continues to experience intermittent discomfort in both feet.  Fibromyalgia: Followed by pain management.  She continues to  experience generalized myalgias and muscle tenderness due to fibromyalgia.  She has generalized hyperalgesia and positive tender points on examination today.  She is trapezius muscle tension tenderness bilaterally.  She requested trigger point injections today.  She tolerated the procedures well.  Procedure notes were completed above.  She remains on Cymbalta as prescribed along with Lyrica, oxycodone, and tizanidine for symptomatic relief.  Discussed the importance of regular exercise and good sleep hygiene.  DDD (degenerative disc disease), cervical: C-spine has limited range of motion without rotation.  She presents today with trapezius muscle tension and tenderness bilaterally.  Bilateral trapezius trigger point injections were performed today.  Trapezius muscle spasm -She presents today with trapezius muscle tension and tenderness bilaterally.  She requested trapezius trigger point injections bilaterally.  She tolerated procedure well.  Procedure notes were completed above.  Aftercare was discussed.  Plan: Trigger Point Inj  Other medical conditions are listed as follows:  Other insomnia: Discussed the importance of good sleep hygiene.  Other fatigue: Discussed the importance of regular exercise and good sleep hygiene.  Vitamin D deficiency  Major depressive disorder, recurrent episode, moderate ([place])   Orders: Orders Placed This Encounter  Procedures   Trigger Point Inj   CBC with Differential/Platelet   COMPLETE METABOLIC PANEL WITH GFR   No orders of the defined types were placed in this encounter.    Follow-Up Instructions: Return in about 5 months (around 11/28/2022) for Rheumatoid arthritis, Osteoarthritis, Fibromyalgia.   Ofilia Neas, PA-C  Note - This record has been created using Dragon software.  Chart creation errors have been sought, but may not always  have been located. Such creation errors do not reflect on  the standard of medical care.

## 2022-06-28 ENCOUNTER — Ambulatory Visit: Payer: Medicare Other | Attending: Physician Assistant | Admitting: Physician Assistant

## 2022-06-28 ENCOUNTER — Encounter: Payer: Self-pay | Admitting: Physician Assistant

## 2022-06-28 VITALS — BP 117/79 | HR 85 | Resp 12 | Ht 65.0 in | Wt 221.8 lb

## 2022-06-28 DIAGNOSIS — G4709 Other insomnia: Secondary | ICD-10-CM | POA: Diagnosis not present

## 2022-06-28 DIAGNOSIS — Z79899 Other long term (current) drug therapy: Secondary | ICD-10-CM

## 2022-06-28 DIAGNOSIS — M19041 Primary osteoarthritis, right hand: Secondary | ICD-10-CM

## 2022-06-28 DIAGNOSIS — G5601 Carpal tunnel syndrome, right upper limb: Secondary | ICD-10-CM

## 2022-06-28 DIAGNOSIS — M5030 Other cervical disc degeneration, unspecified cervical region: Secondary | ICD-10-CM

## 2022-06-28 DIAGNOSIS — M19071 Primary osteoarthritis, right ankle and foot: Secondary | ICD-10-CM

## 2022-06-28 DIAGNOSIS — M0600 Rheumatoid arthritis without rheumatoid factor, unspecified site: Secondary | ICD-10-CM | POA: Diagnosis not present

## 2022-06-28 DIAGNOSIS — E559 Vitamin D deficiency, unspecified: Secondary | ICD-10-CM

## 2022-06-28 DIAGNOSIS — R5383 Other fatigue: Secondary | ICD-10-CM | POA: Diagnosis not present

## 2022-06-28 DIAGNOSIS — M797 Fibromyalgia: Secondary | ICD-10-CM

## 2022-06-28 DIAGNOSIS — M7062 Trochanteric bursitis, left hip: Secondary | ICD-10-CM

## 2022-06-28 DIAGNOSIS — M19042 Primary osteoarthritis, left hand: Secondary | ICD-10-CM

## 2022-06-28 DIAGNOSIS — M1712 Unilateral primary osteoarthritis, left knee: Secondary | ICD-10-CM

## 2022-06-28 DIAGNOSIS — F331 Major depressive disorder, recurrent, moderate: Secondary | ICD-10-CM

## 2022-06-28 DIAGNOSIS — Z96651 Presence of right artificial knee joint: Secondary | ICD-10-CM | POA: Diagnosis not present

## 2022-06-28 DIAGNOSIS — M7061 Trochanteric bursitis, right hip: Secondary | ICD-10-CM

## 2022-06-28 DIAGNOSIS — M62838 Other muscle spasm: Secondary | ICD-10-CM | POA: Diagnosis not present

## 2022-06-28 DIAGNOSIS — M19072 Primary osteoarthritis, left ankle and foot: Secondary | ICD-10-CM

## 2022-06-28 MED ORDER — TRIAMCINOLONE ACETONIDE 40 MG/ML IJ SUSP
10.0000 mg | INTRAMUSCULAR | Status: AC | PRN
  Administered 2022-06-28: 10 mg via INTRAMUSCULAR

## 2022-06-28 MED ORDER — LIDOCAINE HCL 1 % IJ SOLN
0.5000 mL | INTRAMUSCULAR | Status: AC | PRN
  Administered 2022-06-28: .5 mL

## 2022-06-28 NOTE — Patient Instructions (Signed)
Standing Labs We placed an order today for your standing lab work.   Please have your standing labs drawn in June and every 3 months   Please have your labs drawn 2 weeks prior to your appointment so that the provider can discuss your lab results at your appointment, if possible.  Please note that you may see your imaging and lab results in MyChart before we have reviewed them. We will contact you once all results are reviewed. Please allow our office up to 72 hours to thoroughly review all of the results before contacting the office for clarification of your results.  WALK-IN LAB HOURS  Monday through Thursday from 8:00 am -12:30 pm and 1:00 pm-5:00 pm and Friday from 8:00 am-12:00 pm.  Patients with office visits requiring labs will be seen before walk-in labs.  You may encounter longer than normal wait times. Please allow additional time. Wait times may be shorter on  Monday and Thursday afternoons.  We do not book appointments for walk-in labs. We appreciate your patience and understanding with our staff.   Labs are drawn by [facility]. Please bring your co-pay at the time of your lab draw.  You may receive a bill from [facility] for your lab work.  Please note if you are on Hydroxychloroquine and and an order has been placed for a Hydroxychloroquine level,  you will need to have it drawn 4 hours or more after your last dose.  If you wish to have your labs drawn at another location, please call the office 24 hours in advance so we can fax the orders.  The office is located at [address], [address], [place], [place] [address]   If you have any questions regarding directions or hours of operation,  please call [phone].   As a reminder, please drink plenty of water prior to coming for your lab work. Thanks!  

## 2022-06-29 ENCOUNTER — Ambulatory Visit: Payer: Medicare Other | Admitting: Orthopaedic Surgery

## 2022-06-29 LAB — COMPLETE METABOLIC PANEL WITH GFR
AG Ratio: 1.6 (calc) (ref 1.0–2.5)
ALT: 14 U/L (ref 6–29)
AST: 16 U/L (ref 10–35)
Albumin: 3.9 g/dL (ref 3.6–5.1)
Alkaline phosphatase (APISO): 112 U/L (ref 37–153)
BUN: 10 mg/dL (ref 7–25)
CO2: 27 mmol/L (ref 20–32)
Calcium: 9.3 mg/dL (ref 8.6–10.4)
Chloride: 101 mmol/L (ref 98–110)
Creat: 0.9 mg/dL (ref 0.50–1.03)
Globulin: 2.5 g/dL (calc) (ref 1.9–3.7)
Glucose, Bld: 92 mg/dL (ref 65–99)
Potassium: 4.1 mmol/L (ref 3.5–5.3)
Sodium: 136 mmol/L (ref 135–146)
Total Bilirubin: 0.5 mg/dL (ref 0.2–1.2)
Total Protein: 6.4 g/dL (ref 6.1–8.1)
eGFR: 77 mL/min/{1.73_m2} (ref >=60)

## 2022-06-29 LAB — CBC WITH DIFFERENTIAL/PLATELET
Absolute Monocytes: 660 cells/uL (ref 200–950)
Basophils Absolute: 53 cells/uL (ref 0–200)
Basophils Relative: 0.8 %
Eosinophils Absolute: 79 cells/uL (ref 15–500)
Eosinophils Relative: 1.2 %
HCT: 36.2 % (ref 35.0–45.0)
Hemoglobin: 11.4 g/dL — ABNORMAL LOW (ref 11.7–15.5)
Lymphs Abs: 2812 cells/uL (ref 850–3900)
MCH: 23.6 pg — ABNORMAL LOW (ref 27.0–33.0)
MCHC: 31.5 g/dL — ABNORMAL LOW (ref 32.0–36.0)
MCV: 74.9 fL — ABNORMAL LOW (ref 80.0–100.0)
MPV: 10.5 fL (ref 7.5–12.5)
Monocytes Relative: 10 %
Neutro Abs: 2996 cells/uL (ref 1500–7800)
Neutrophils Relative %: 45.4 %
Platelets: 377 10*3/uL (ref 140–400)
RBC: 4.83 10*6/uL (ref 3.80–5.10)
RDW: 16.2 % — ABNORMAL HIGH (ref 11.0–15.0)
Total Lymphocyte: 42.6 %
WBC: 6.6 10*3/uL (ref 3.8–10.8)

## 2022-06-29 NOTE — Progress Notes (Signed)
CBC--patient remains anemic-stable.  CMP WNL.

## 2022-07-06 ENCOUNTER — Ambulatory Visit: Payer: Medicare Other | Admitting: Orthopaedic Surgery

## 2022-07-18 ENCOUNTER — Other Ambulatory Visit: Payer: Self-pay | Admitting: Rheumatology

## 2022-07-31 ENCOUNTER — Other Ambulatory Visit: Payer: Self-pay | Admitting: *Deleted

## 2022-07-31 DIAGNOSIS — M0600 Rheumatoid arthritis without rheumatoid factor, unspecified site: Secondary | ICD-10-CM

## 2022-07-31 DIAGNOSIS — Z79899 Other long term (current) drug therapy: Secondary | ICD-10-CM

## 2022-07-31 MED ORDER — ORENCIA CLICKJECT 125 MG/ML ~~LOC~~ SOAJ: 125.0000 mg | SUBCUTANEOUS | 0 refills | Status: DC

## 2022-07-31 NOTE — Telephone Encounter (Signed)
Last Fill: 02/27/2022  Labs: 06/28/2022 CBC--patient remains anemic-stable. CMP WNL.  TB Gold: 03/30/2023   TB gold negative   Next Visit: 11/29/2022  Last Visit: 06/28/2022  DX: Seronegative rheumatoid arthritis   Current Dose per office note 06/28/2022: Orencia 125 mg subcutaneous injections once weekly   Okay to refill Orencia?

## 2022-08-18 ENCOUNTER — Other Ambulatory Visit: Payer: Self-pay | Admitting: Rheumatology

## 2022-08-21 ENCOUNTER — Other Ambulatory Visit: Payer: Self-pay | Admitting: Rheumatology

## 2022-08-21 NOTE — Telephone Encounter (Signed)
Last Fill: 06/02/2022  Labs: 06/28/2022 CBC--patient remains anemic-stable. CMP WNL.  Next Visit: 11/29/2022  Last Visit: 06/28/2022  DX: Seronegative rheumatoid arthritis   Current Dose per office note 06/28/2022: Arava 20 mg daily   Okay to refill Arava ?

## 2022-08-24 ENCOUNTER — Other Ambulatory Visit: Payer: Self-pay | Admitting: *Deleted

## 2022-08-24 MED ORDER — PREDNISONE 5 MG PO TABS: ORAL_TABLET | ORAL | 0 refills | Status: DC

## 2022-08-24 NOTE — Telephone Encounter (Signed)
Ok to send in prednisone 20 mg tapering by 5 mg every 4 days.  Take prednisone in the morning with food and avoid the use of NSAIDs.

## 2022-08-24 NOTE — Addendum Note (Signed)
Addended by: Henriette Combs on: 08/24/2022 09:06 AM   Modules accepted: Orders

## 2022-08-24 NOTE — Telephone Encounter (Signed)
Patient contacted the office stating she is having an RA flare in her hands which has been going on for the last 2 weeks. Patient states she is having pain and swelling in her hands. Patient states she has not missed any doses of her medication. Patient is requesting a prescription for Prednisone to be sent to [facility].

## 2022-08-24 NOTE — Telephone Encounter (Signed)
Patient advised we will send in prednisone 20 mg tapering by 5 mg every 4 days. Patient advised to take prednisone in the morning with food and avoid the use of NSAIDs.

## 2022-08-25 DIAGNOSIS — G894 Chronic pain syndrome: Secondary | ICD-10-CM | POA: Diagnosis not present

## 2022-08-25 DIAGNOSIS — M47816 Spondylosis without myelopathy or radiculopathy, lumbar region: Secondary | ICD-10-CM | POA: Diagnosis not present

## 2022-08-25 DIAGNOSIS — M1711 Unilateral primary osteoarthritis, right knee: Secondary | ICD-10-CM | POA: Diagnosis not present

## 2022-08-25 DIAGNOSIS — M2550 Pain in unspecified joint: Secondary | ICD-10-CM | POA: Diagnosis not present

## 2022-08-25 DIAGNOSIS — M069 Rheumatoid arthritis, unspecified: Secondary | ICD-10-CM | POA: Diagnosis not present

## 2022-08-25 DIAGNOSIS — M797 Fibromyalgia: Secondary | ICD-10-CM | POA: Diagnosis not present

## 2022-09-07 ENCOUNTER — Other Ambulatory Visit: Payer: Self-pay | Admitting: Rheumatology

## 2022-09-15 ENCOUNTER — Other Ambulatory Visit: Payer: Self-pay | Admitting: Rheumatology

## 2022-09-15 ENCOUNTER — Other Ambulatory Visit: Payer: Self-pay | Admitting: Physician Assistant

## 2022-09-21 ENCOUNTER — Ambulatory Visit: Payer: Medicare Other | Admitting: Orthopaedic Surgery

## 2022-09-28 ENCOUNTER — Encounter: Payer: Self-pay | Admitting: Orthopaedic Surgery

## 2022-09-28 ENCOUNTER — Ambulatory Visit (INDEPENDENT_AMBULATORY_CARE_PROVIDER_SITE_OTHER): Payer: Medicare Other | Admitting: Orthopaedic Surgery

## 2022-09-28 VITALS — Ht 65.0 in | Wt 221.0 lb

## 2022-09-28 DIAGNOSIS — G5601 Carpal tunnel syndrome, right upper limb: Secondary | ICD-10-CM

## 2022-09-28 NOTE — Progress Notes (Signed)
k  Office Visit Note   Patient: Amanda Hammond           Date of Birth: 06-26-1970           MRN: [id number] Visit Date: 09/28/2022              Requested by: Beverly Gust, PA-C [phone] [place],  [place] [address] PCP: Beverly Gust, [place]   Assessment & Plan: Visit Diagnoses:  1. Carpal tunnel syndrome, right upper limb     Plan: Will set patient up for carpal tunnel release to right hand outpatient.  She will call us if she in the interim before surgery develops right ring finger triggering.  Will defer treatment for the left long finger at a later point when she gets over the carpal tunnel release of the right hand she can let us know when she like to proceed with the left trigger finger release.  Pathophysiology discussed questions were elicited and answered.  Follow-Up Instructions: No follow-ups on file.   Orders:  No orders of the defined types were placed in this encounter.  No orders of the defined types were placed in this encounter.     Procedures: No procedures performed   Clinical Data: No additional findings.   Subjective: Chief Complaint  Patient presents with   Right Hand - Numbness    HPI 52 year old female with right carpal tunnel syndrome.  She has had numbness and tingling which has progressed some in the last 10 years.  States she has rheumatoid arthritis is right-hand dominant and had previous left carpal tunnel release 15 years ago.  She is waited extensively on the opposite right hand.  She has had right ring finger catching and points to the A1 pulley region where she has pain.  Left long finger also triggers.  She is use the wrist splints which she sleeps in at night.  She states when it is more severe she wears it during the day she states she is ready to proceed with right carpal tunnel release.  Currently only her opposite left long finger is triggering.  Cervical MRI 01/07/2022 showed some mild left C5 and C6 foraminal  narrowing opposite side from where she is having her carpal tunnel numbness symptoms.  Review of Systems positive for fibromyalgia rheumatoid arthritis factor positive.  History of depression fatigue fibromyalgia negative for heart disease or  stroke.   Objective: Vital Signs: Ht 5\' 5"  (1.651 m)   Wt 221 lb (100.2 kg)   BMI 36.78 kg/m   Physical Exam Constitutional:      Appearance: She is well-developed.  HENT:     Head: Normocephalic.     Right Ear: External ear normal.     Left Ear: External ear normal. There is no impacted cerumen.  Eyes:     Pupils: Pupils are equal, round, and reactive to light.  Neck:     Thyroid: No thyromegaly.     Trachea: No tracheal deviation.  Cardiovascular:     Rate and Rhythm: Normal rate.  Pulmonary:     Effort: Pulmonary effort is normal.  Abdominal:     Palpations: Abdomen is soft.  Musculoskeletal:     Cervical back: No rigidity.  Skin:    General: Skin is warm and dry.  Neurological:     Mental Status: She is alert and oriented to person, place, and time.  Psychiatric:        Behavior: Behavior normal.  Ortho Exam well-healed left carpal tunnel incision.  Right hand shows positive carpal compression test positive Phalen's.  Slight thenar atrophy.  Left long finger tenderness over the A1 pulley with slight catching.  Mild tenderness over the ring finger A1 pulley without triggering of the right hand.  Specialty Comments:  No specialty comments available.  Imaging: 7-T1:  Unremarkable.   IMPRESSION: 1. Mild for age cervical spondylosis without significant spinal stenosis or overt neural impingement. 2. Mild to moderate left C5 and C6 foraminal narrowing related to disc bulge and uncovertebral disease. No other significant foraminal encroachment within the cervical spine.     Electronically Signed   By: Rise Mu M.D.   On: 01/07/2022 05:46 Underweight on read Okay thanks   PMFS History: Patient Active  Problem List   Diagnosis Date Noted   Rheumatoid factor positive 11/06/2016   Trochanteric bursitis of both hips 11/06/2016   History of fibromyalgia 11/06/2016   DDD (degenerative disc disease), cervical 11/06/2016   Carpal tunnel syndrome, left upper limb 11/06/2016   Carpal tunnel syndrome, right upper limb 11/06/2016   Trigger finger, left middle finger 11/06/2016   Major depressive disorder, recurrent episode, moderate (H[facility]) 04/27/2016   Pain in left hand 04/25/2016   Pain in right hand 04/25/2016   Primary osteoarthritis of both hands 04/25/2016   Primary osteoarthritis of both knees 04/25/2016   Primary osteoarthritis of both feet 04/25/2016   Fibromyalgia 02/17/2016   Other insomnia 02/17/2016   Fatigue 02/17/2016   B12 DEFICIENCY 02/01/2010   Vitamin D deficiency 02/01/2010   ANEMIA, IRON DEFICIENCY 12/30/2009   OTHER DYSPNEA AND RESPIRATORY ABNORMALITIES 12/30/2009   Nonspecific (abnormal) findings on radiological and other examination of body structure 12/30/2009   COMPUTERIZED TOMOGRAPHY, CHEST, ABNORMAL 12/30/2009   Past Medical History:  Diagnosis Date   Anemia    Anxiety    Depression    Fibromyalgia    Hypertension    Osteoarthritis    Pneumonia    PONV (postoperative nausea and vomiting)    Rheumatoid arthritis (HCC)    Vitamin D deficiency     Family History  Problem Relation Age of Onset   Diabetes Mother    Hypertension Mother    Hypertension Father    Hypertension Brother    Osteoarthritis Brother    Hypertension Brother    Osteoarthritis Brother    Endometriosis Daughter    Anesthesia problems Neg Hx    Hypotension Neg Hx    Pseudochol deficiency Neg Hx    Malignant hyperthermia Neg Hx     Past Surgical History:  Procedure Laterality Date   APPENDECTOMY  2010   [facility]   CARPAL TUNNEL RELEASE Left    CHOLECYSTECTOMY  1999   lap, [facility]   GASTRIC BYPASS  2003   [facility]   HERNIA REPAIR  08/2010, 02/2010    incisional, [facility], [facility]   INCISIONAL  HERNIA REPAIR  10/12/2010   Procedure: HERNIA REPAIR INCISIONAL;  Surgeon: Dalia Heading;  Location: [facility];  Service: General;  Laterality: N/A;  Recurrent Incisional Hernia Repair with Mesh   KNEE SURGERY Right 01/2018   meniscal tear    KNEE SURGERY Right 12/2019   PANN[facility]OMY  2008   [facility]   SHOULDER SURGERY Left    TENNIS ELBOW RELEASE/NIRSCHEL PROCEDURE Bilateral    TOTAL KNEE ARTHROPLASTY Right 03/31/2022   Procedure: TOTAL KNEE ARTHROPLASTY;  Surgeon: Frederico Hamman, MD;  Location: [facility];  Service: Orthopedics;  Laterality: Right;   Social History  Occupational History   Occupation: disability  Tobacco Use   Smoking status: Never    Passive exposure: Never   Smokeless tobacco: Never  Vaping Use   Vaping Use: Never used  Substance and Sexual Activity   Alcohol use: No   Drug use: Never   Sexual activity: Yes    Birth control/protection: I.U.D.

## 2022-09-29 ENCOUNTER — Other Ambulatory Visit: Payer: Self-pay | Admitting: Rheumatology

## 2022-09-29 DIAGNOSIS — M25561 Pain in right knee: Secondary | ICD-10-CM | POA: Diagnosis not present

## 2022-10-02 ENCOUNTER — Other Ambulatory Visit: Payer: Self-pay | Admitting: *Deleted

## 2022-10-02 DIAGNOSIS — M25461 Effusion, right knee: Secondary | ICD-10-CM | POA: Diagnosis not present

## 2022-10-02 DIAGNOSIS — M25561 Pain in right knee: Secondary | ICD-10-CM | POA: Diagnosis not present

## 2022-10-02 DIAGNOSIS — M0600 Rheumatoid arthritis without rheumatoid factor, unspecified site: Secondary | ICD-10-CM

## 2022-10-02 DIAGNOSIS — Z79899 Other long term (current) drug therapy: Secondary | ICD-10-CM

## 2022-10-02 MED ORDER — ORENCIA CLICKJECT 125 MG/ML ~~LOC~~ SOAJ: 125.0000 mg | SUBCUTANEOUS | 0 refills | Status: DC

## 2022-10-02 NOTE — Telephone Encounter (Signed)
Refill request received via fax from [facility] for Orencia  Last Fill: 07/31/2022  Labs: 06/28/2022 CBC--patient remains anemic-stable. CMP WNL.  TB Gold: 03/29/2022 Neg    Next Visit: 11/29/2022  Last Visit: 06/28/2022  OZ:HYQMVHQIONGE rheumatoid arthritis   Current Dose per office note 06/28/2022: Orencia 125 mg subcutaneous injections once weekly   Left message to advise patient she is due to update her labs.   Okay to refill Orencia?

## 2022-10-09 NOTE — Telephone Encounter (Signed)
I called patient, patient will have PLQ eye exam faxed to office.

## 2022-10-13 DIAGNOSIS — I10 Essential (primary) hypertension: Secondary | ICD-10-CM | POA: Diagnosis not present

## 2022-10-13 DIAGNOSIS — Z0001 Encounter for general adult medical examination with abnormal findings: Secondary | ICD-10-CM | POA: Diagnosis not present

## 2022-10-13 DIAGNOSIS — E039 Hypothyroidism, unspecified: Secondary | ICD-10-CM | POA: Diagnosis not present

## 2022-10-13 DIAGNOSIS — R739 Hyperglycemia, unspecified: Secondary | ICD-10-CM | POA: Diagnosis not present

## 2022-10-13 DIAGNOSIS — M069 Rheumatoid arthritis, unspecified: Secondary | ICD-10-CM | POA: Diagnosis not present

## 2022-10-13 DIAGNOSIS — E7849 Other hyperlipidemia: Secondary | ICD-10-CM | POA: Diagnosis not present

## 2022-10-25 ENCOUNTER — Telehealth: Payer: Self-pay | Admitting: *Deleted

## 2022-10-25 DIAGNOSIS — Z9225 Personal history of immunosupression therapy: Secondary | ICD-10-CM

## 2022-10-25 DIAGNOSIS — Z79899 Other long term (current) drug therapy: Secondary | ICD-10-CM

## 2022-10-25 DIAGNOSIS — Z111 Encounter for screening for respiratory tuberculosis: Secondary | ICD-10-CM

## 2022-10-25 NOTE — Telephone Encounter (Signed)
Received a refill request from [facility] for Orencia. Last prescription was sent for a 30 day supply as patient is overdue to update her labs. Last labs were March 2024. Attempted to contact the patient and left message to advise patient she is overdue to update labs and will need to update prior to refilling her medication.

## 2022-11-16 NOTE — Progress Notes (Deleted)
Office Visit Note  Patient: Amanda Hammond             Date of Birth: Mar 07, 1971           MRN: [id number]             PCP: Lance Bosch Referring: Beverly Gust, [place] Visit Date: 11/29/2022 Occupation: @GUAROCC @  Subjective:  No chief complaint on file.   History of Present Illness: Amanda Hammond is a 52 y.o. female ***     Activities of Daily Living:  Patient reports morning stiffness for *** {minute/hour:19697}.   Patient {ACTIONS;DENIES/REPORTS:21021675::"Denies"} nocturnal pain.  Difficulty dressing/grooming: {ACTIONS;DENIES/REPORTS:21021675::"Denies"} Difficulty climbing stairs: {ACTIONS;DENIES/REPORTS:21021675::"Denies"} Difficulty getting out of chair: {ACTIONS;DENIES/REPORTS:21021675::"Denies"} Difficulty using hands for taps, buttons, cutlery, and/or writing: {ACTIONS;DENIES/REPORTS:21021675::"Denies"}  No Rheumatology ROS completed.   PMFS History:  Patient Active Problem List   Diagnosis Date Noted   Rheumatoid factor positive 11/06/2016   Trochanteric bursitis of both hips 11/06/2016   History of fibromyalgia 11/06/2016   DDD (degenerative disc disease), cervical 11/06/2016   Carpal tunnel syndrome, left upper limb 11/06/2016   Carpal tunnel syndrome, right upper limb 11/06/2016   Trigger finger, left middle finger 11/06/2016   Major depressive disorder, recurrent episode, moderate (HCC) 04/27/2016   Pain in left hand 04/25/2016   Pain in right hand 04/25/2016   Primary osteoarthritis of both hands 04/25/2016   Primary osteoarthritis of both knees 04/25/2016   Primary osteoarthritis of both feet 04/25/2016   Fibromyalgia 02/17/2016   Other insomnia 02/17/2016   Fatigue 02/17/2016   B12 DEFICIENCY 02/01/2010   Vitamin D deficiency 02/01/2010   ANEMIA, IRON DEFICIENCY 12/30/2009   OTHER DYSPNEA AND RESPIRATORY ABNORMALITIES 12/30/2009   Nonspecific (abnormal) findings on radiological and other examination of body structure  12/30/2009   COMPUTERIZED TOMOGRAPHY, CHEST, ABNORMAL 12/30/2009    Past Medical History:  Diagnosis Date   Anemia    Anxiety    Depression    Fibromyalgia    Hypertension    Osteoarthritis    Pneumonia    PONV (postoperative nausea and vomiting)    Rheumatoid arthritis (HCC)    Vitamin D deficiency     Family History  Problem Relation Age of Onset   Diabetes Mother    Hypertension Mother    Hypertension Father    Hypertension Brother    Osteoarthritis Brother    Hypertension Brother    Osteoarthritis Brother    Endometriosis Daughter    Anesthesia problems Neg Hx    Hypotension Neg Hx    Pseudochol deficiency Neg Hx    Malignant hyperthermia Neg Hx    Past Surgical History:  Procedure Laterality Date   APPENDECTOMY  2010   [facility]   CARPAL TUNNEL RELEASE Left    CHOLECYSTECTOMY  1999   lap, [facility]   GASTRIC BYPASS  2003   [facility]   HERNIA REPAIR  08/2010, 02/2010    incisional, [facility], [facility]   INCISIONAL HERNIA REPAIR  10/12/2010   Procedure: HERNIA REPAIR INCISIONAL;  Surgeon: Dalia Heading;  Location: [facility];  Service: General;  Laterality: N/A;  Recurrent Incisional Hernia Repair with Mesh   KNEE SURGERY Right 01/2018   meniscal tear    KNEE SURGERY Right 12/2019   PANNICULECTOMY  2008   [facility]   SHOULDER SURGERY Left    TENNIS ELBOW RELEASE/NIRSCHEL PROCEDURE Bilateral    TOTAL KNEE ARTHROPLASTY Right 03/31/2022   Procedure: TOTAL KNEE ARTHROPLASTY;  Surgeon: Frederico Hamman, MD;  Location: [facility];  Service: Orthopedics;  Laterality: Right;   Social History   Social History Narrative   Not on file    There is no immunization history on file for this patient.   Objective: Vital Signs: There were no vitals taken for this visit.   Physical Exam   Musculoskeletal Exam: ***  CDAI Exam: CDAI Score: -- Patient Global: --; Provider Global: -- Swollen: --; Tender: -- Joint Exam 11/29/2022   No joint exam has been documented for this visit   There is currently  no information documented on the homunculus. Go to the Rheumatology activity and complete the homunculus joint exam.  Investigation: No additional findings.  Imaging: No results found.  Recent Labs: Lab Results  Component Value Date   WBC 6.6 06/28/2022   HGB 11.4 (L) 06/28/2022   PLT 377 06/28/2022   NA 136 06/28/2022   K 4.1 06/28/2022   CL 101 06/28/2022   CO2 27 06/28/2022   GLUCOSE 92 06/28/2022   BUN 10 06/28/2022   CREATININE 0.90 06/28/2022   BILITOT 0.5 06/28/2022   ALKPHOS 87 03/22/2022   AST 16 06/28/2022   ALT 14 06/28/2022   PROT 6.4 06/28/2022   ALBUMIN 3.4 (L) 03/22/2022   CALCIUM 9.3 06/28/2022   GFRAA 67 09/02/2020   QFTBGOLDPLUS NEGATIVE 03/29/2022    Speciality Comments: Orencia started 08/11/21  Patient has PLQ eye exam scheduled for 05/03/2022 at [facility]  Procedures:  No procedures performed Allergies: Patient has no known allergies.   Assessment / Plan:     Visit Diagnoses: No diagnosis found.  Orders: No orders of the defined types were placed in this encounter.  No orders of the defined types were placed in this encounter.   Face-to-face time spent with patient was *** minutes. Greater than 50% of time was spent in counseling and coordination of care.  Follow-Up Instructions: No follow-ups on file.   Ellen Henri, CMA  Note - This record has been created using Animal nutritionist.  Chart creation errors have been sought, but may not always  have been located. Such creation errors do not reflect on  the standard of medical care.

## 2022-11-21 ENCOUNTER — Other Ambulatory Visit: Payer: Self-pay | Admitting: *Deleted

## 2022-11-21 MED ORDER — HYDROXYCHLOROQUINE SULFATE 200 MG PO TABS: 200.0000 mg | ORAL_TABLET | Freq: Two times a day (BID) | ORAL | 0 refills | Status: DC

## 2022-11-21 NOTE — Telephone Encounter (Signed)
Patient contacted the office regarding her refill for Orencia. Patient is aware she will need to have labs prior to refilling this prescription. Patient states she will be in the area on Friday. Patient advised we have lab hours on Friday from 8 am to 12 pm. Patient states she will be by during that time. Lab orders are in place. Patient states she has scheduled her PLQ eye exam. Patient is requesting a refill to last until she has her eye exam. Patient states she has noticed that the PLQ does help. Patient states she pain in her hips and her hands being without it.   Last Fill: 08/18/2022 (30 day supply)  Eye exam: not on file. Scheduled for 12/06/2022   Labs: 06/28/2022 CBC--patient remains anemic-stable. CMP WNL.  Next Visit: 11/29/2022  Last Visit: 06/28/2022  NF:[id number] rheumatoid arthritis   Current Dose per office note 06/28/2022: Plaquenil 200 mg 1 tablet by mouth twice daily.   Okay to refill Plaquenil?

## 2022-11-23 DIAGNOSIS — G43009 Migraine without aura, not intractable, without status migrainosus: Secondary | ICD-10-CM | POA: Diagnosis not present

## 2022-11-23 DIAGNOSIS — M797 Fibromyalgia: Secondary | ICD-10-CM | POA: Diagnosis not present

## 2022-11-23 DIAGNOSIS — R519 Headache, unspecified: Secondary | ICD-10-CM | POA: Diagnosis not present

## 2022-11-23 DIAGNOSIS — E871 Hypo-osmolality and hyponatremia: Secondary | ICD-10-CM | POA: Diagnosis not present

## 2022-11-23 DIAGNOSIS — R110 Nausea: Secondary | ICD-10-CM | POA: Diagnosis not present

## 2022-11-23 DIAGNOSIS — M25511 Pain in right shoulder: Secondary | ICD-10-CM | POA: Diagnosis not present

## 2022-11-23 DIAGNOSIS — W19XXXA Unspecified fall, initial encounter: Secondary | ICD-10-CM | POA: Diagnosis not present

## 2022-11-23 DIAGNOSIS — I10 Essential (primary) hypertension: Secondary | ICD-10-CM | POA: Diagnosis not present

## 2022-11-23 DIAGNOSIS — R7303 Prediabetes: Secondary | ICD-10-CM | POA: Diagnosis not present

## 2022-11-23 DIAGNOSIS — S0990XD Unspecified injury of head, subsequent encounter: Secondary | ICD-10-CM | POA: Diagnosis not present

## 2022-11-23 DIAGNOSIS — S0990XA Unspecified injury of head, initial encounter: Secondary | ICD-10-CM | POA: Diagnosis not present

## 2022-11-23 DIAGNOSIS — Z1389 Encounter for screening for other disorder: Secondary | ICD-10-CM | POA: Diagnosis not present

## 2022-11-23 DIAGNOSIS — R410 Disorientation, unspecified: Secondary | ICD-10-CM | POA: Diagnosis not present

## 2022-11-23 DIAGNOSIS — M069 Rheumatoid arthritis, unspecified: Secondary | ICD-10-CM | POA: Diagnosis not present

## 2022-11-23 DIAGNOSIS — G8929 Other chronic pain: Secondary | ICD-10-CM | POA: Diagnosis not present

## 2022-11-24 ENCOUNTER — Other Ambulatory Visit: Payer: Self-pay | Admitting: *Deleted

## 2022-11-24 DIAGNOSIS — G894 Chronic pain syndrome: Secondary | ICD-10-CM | POA: Diagnosis not present

## 2022-11-24 DIAGNOSIS — M47816 Spondylosis without myelopathy or radiculopathy, lumbar region: Secondary | ICD-10-CM | POA: Diagnosis not present

## 2022-11-24 DIAGNOSIS — Z79899 Other long term (current) drug therapy: Secondary | ICD-10-CM | POA: Diagnosis not present

## 2022-11-24 DIAGNOSIS — M069 Rheumatoid arthritis, unspecified: Secondary | ICD-10-CM | POA: Diagnosis not present

## 2022-11-24 DIAGNOSIS — M797 Fibromyalgia: Secondary | ICD-10-CM | POA: Diagnosis not present

## 2022-11-25 LAB — CBC WITH DIFFERENTIAL/PLATELET
Absolute Monocytes: 498 {cells}/uL (ref 200–950)
Basophils Absolute: 50 {cells}/uL (ref 0–200)
Basophils Relative: 0.8 %
Eosinophils Absolute: 101 cells/uL (ref 15–500)
Eosinophils Relative: 1.6 %
HCT: 34.4 % — ABNORMAL LOW (ref 35.0–45.0)
Hemoglobin: 10.8 g/dL — ABNORMAL LOW (ref 11.7–15.5)
Lymphs Abs: 2438 {cells}/uL (ref 850–3900)
MCH: 23.9 pg — ABNORMAL LOW (ref 27.0–33.0)
MCHC: 31.4 g/dL — ABNORMAL LOW (ref 32.0–36.0)
MCV: 76.3 fL — ABNORMAL LOW (ref 80.0–100.0)
MPV: 9.6 fL (ref 7.5–12.5)
Monocytes Relative: 7.9 %
Neutro Abs: 3213 {cells}/uL (ref 1500–7800)
Neutrophils Relative %: 51 %
Platelets: 315 10*3/uL (ref 140–400)
RBC: 4.51 10*6/uL (ref 3.80–5.10)
RDW: 15.6 % — ABNORMAL HIGH (ref 11.0–15.0)
Total Lymphocyte: 38.7 %
WBC: 6.3 10*3/uL (ref 3.8–10.8)

## 2022-11-25 LAB — COMPLETE METABOLIC PANEL WITH GFR
AG Ratio: 1.6 (calc) (ref 1.0–2.5)
ALT: 13 U/L (ref 6–29)
AST: 16 U/L (ref 10–35)
Albumin: 4 g/dL (ref 3.6–5.1)
Alkaline phosphatase (APISO): 110 U/L (ref 37–153)
BUN: 12 mg/dL (ref 7–25)
CO2: 25 mmol/L (ref 20–32)
Calcium: 9.4 mg/dL (ref 8.6–10.4)
Chloride: 99 mmol/L (ref 98–110)
Creat: 0.86 mg/dL (ref 0.50–1.03)
Globulin: 2.5 g/dL (ref 1.9–3.7)
Glucose, Bld: 93 mg/dL (ref 65–99)
Potassium: 4.4 mmol/L (ref 3.5–5.3)
Sodium: 133 mmol/L — ABNORMAL LOW (ref 135–146)
Total Bilirubin: 0.4 mg/dL (ref 0.2–1.2)
Total Protein: 6.5 g/dL (ref 6.1–8.1)
eGFR: 82 mL/min/{1.73_m2} (ref >=60)

## 2022-11-26 NOTE — Progress Notes (Signed)
Hemoglobin is low at 10.8.  Labs indicate iron deficiency anemia.  Patient should see her PCP for the evaluation of anemia.  Patient is on anticoagulant.  CMP shows low sodium.  Please forward results to her PCP.

## 2022-11-29 ENCOUNTER — Ambulatory Visit: Payer: Medicare Other | Admitting: Rheumatology

## 2022-11-29 DIAGNOSIS — G4709 Other insomnia: Secondary | ICD-10-CM

## 2022-11-29 DIAGNOSIS — E559 Vitamin D deficiency, unspecified: Secondary | ICD-10-CM

## 2022-11-29 DIAGNOSIS — F331 Major depressive disorder, recurrent, moderate: Secondary | ICD-10-CM

## 2022-11-29 DIAGNOSIS — G5601 Carpal tunnel syndrome, right upper limb: Secondary | ICD-10-CM

## 2022-11-29 DIAGNOSIS — M19041 Primary osteoarthritis, right hand: Secondary | ICD-10-CM

## 2022-11-29 DIAGNOSIS — Z79899 Other long term (current) drug therapy: Secondary | ICD-10-CM

## 2022-11-29 DIAGNOSIS — M62838 Other muscle spasm: Secondary | ICD-10-CM

## 2022-11-29 DIAGNOSIS — M19071 Primary osteoarthritis, right ankle and foot: Secondary | ICD-10-CM

## 2022-11-29 DIAGNOSIS — Z96651 Presence of right artificial knee joint: Secondary | ICD-10-CM

## 2022-11-29 DIAGNOSIS — M5030 Other cervical disc degeneration, unspecified cervical region: Secondary | ICD-10-CM

## 2022-11-29 DIAGNOSIS — M1712 Unilateral primary osteoarthritis, left knee: Secondary | ICD-10-CM

## 2022-11-29 DIAGNOSIS — M7061 Trochanteric bursitis, right hip: Secondary | ICD-10-CM

## 2022-11-29 DIAGNOSIS — M0600 Rheumatoid arthritis without rheumatoid factor, unspecified site: Secondary | ICD-10-CM

## 2022-11-29 DIAGNOSIS — M797 Fibromyalgia: Secondary | ICD-10-CM

## 2022-11-29 DIAGNOSIS — R5383 Other fatigue: Secondary | ICD-10-CM

## 2022-12-11 DIAGNOSIS — U071 COVID-19: Secondary | ICD-10-CM | POA: Diagnosis not present

## 2022-12-18 DIAGNOSIS — J40 Bronchitis, not specified as acute or chronic: Secondary | ICD-10-CM | POA: Diagnosis not present

## 2022-12-18 DIAGNOSIS — R059 Cough, unspecified: Secondary | ICD-10-CM | POA: Diagnosis not present

## 2022-12-19 ENCOUNTER — Other Ambulatory Visit: Payer: Self-pay | Admitting: Physician Assistant

## 2022-12-19 ENCOUNTER — Telehealth: Payer: Self-pay | Admitting: Rheumatology

## 2022-12-19 NOTE — Telephone Encounter (Signed)
Last Fill: 5/20//2024  Labs: 11/24/2022 Hemoglobin is low at 10.8.  CMP shows low sodium.   Next Visit: Due August 2024. Message sent to the front to schedule.   Last Visit: 06/28/2022  DX:  Seronegative rheumatoid arthritis   Current Dose per office note 06/28/2022: Arava 20 mg daily   Okay to refill Arava ?

## 2022-12-19 NOTE — Telephone Encounter (Signed)
Please schedule patient a follow up visit. Patient was due August 2024. Thanks!

## 2022-12-19 NOTE — Telephone Encounter (Signed)
Called patient and scheduled follow-up appointment with Ladona Ridgel for 01/17/23.  Patient states she was diagnosed with Covid on 12/04/22 and was given antibiotics and steroid medication.  Patient finished the medication, but was still experiencing congestion, sore throat, shortness of breath.  Patient went back to her PCP yesterday, 12/18/22 and was prescribed Zpack and inhaler.  Patient states she stopped taking her Orencia injections when she began the first set of antibiotics.  Patient is requesting prescription refill of Leflunomide to be sent to [facility].

## 2022-12-19 NOTE — Telephone Encounter (Signed)
Patient advised she will need to hold her Arava until she has completed the antibiotics and all symptoms have resolved. Patient advised once she is well we can send in her refill. Patient expressed understanding.

## 2022-12-20 ENCOUNTER — Encounter: Payer: Medicare Other | Admitting: Orthopaedic Surgery

## 2023-01-03 ENCOUNTER — Encounter: Payer: Medicare Other | Admitting: Orthopaedic Surgery

## 2023-01-09 DIAGNOSIS — Z1231 Encounter for screening mammogram for malignant neoplasm of breast: Secondary | ICD-10-CM | POA: Diagnosis not present

## 2023-01-16 NOTE — Progress Notes (Deleted)
Office Visit Note  Patient: Amanda Hammond             Date of Birth: 1970-05-30           MRN: [id number]             PCP: Lance Bosch Referring: Beverly Gust, [place] Visit Date: 01/17/2023 Occupation: @GUAROCC @  Subjective:    History of Present Illness: Amanda Hammond is a 52 y.o. female with history of seronegative rheumatoid arthritis.  Patient remains on Orencia 125 mg subcutaneous injections once weekly, Arava 20 mg daily, and Plaquenil 200 mg 1 tablet by mouth twice daily.   CBC and CMP updated on 11/24/22. TB gold negative on 03/29/22.  PLQ eye exam scheduled for 12/06/2022???   Activities of Daily Living:  Patient reports morning stiffness for *** {minute/hour:19697}.   Patient {ACTIONS;DENIES/REPORTS:21021675::"Denies"} nocturnal pain.  Difficulty dressing/grooming: {ACTIONS;DENIES/REPORTS:21021675::"Denies"} Difficulty climbing stairs: {ACTIONS;DENIES/REPORTS:21021675::"Denies"} Difficulty getting out of chair: {ACTIONS;DENIES/REPORTS:21021675::"Denies"} Difficulty using hands for taps, buttons, cutlery, and/or writing: {ACTIONS;DENIES/REPORTS:21021675::"Denies"}  No Rheumatology ROS completed.   PMFS History:  Patient Active Problem List   Diagnosis Date Noted   Rheumatoid factor positive 11/06/2016   Trochanteric bursitis of both hips 11/06/2016   History of fibromyalgia 11/06/2016   DDD (degenerative disc disease), cervical 11/06/2016   Carpal tunnel syndrome, left upper limb 11/06/2016   Carpal tunnel syndrome, right upper limb 11/06/2016   Trigger finger, left middle finger 11/06/2016   Major depressive disorder, recurrent episode, moderate (HCC) 04/27/2016   Pain in left hand 04/25/2016   Pain in right hand 04/25/2016   Primary osteoarthritis of both hands 04/25/2016   Primary osteoarthritis of both knees 04/25/2016   Primary osteoarthritis of both feet 04/25/2016   Fibromyalgia 02/17/2016   Other insomnia 02/17/2016   Fatigue  02/17/2016   B12 DEFICIENCY 02/01/2010   Vitamin D deficiency 02/01/2010   ANEMIA, IRON DEFICIENCY 12/30/2009   OTHER DYSPNEA AND RESPIRATORY ABNORMALITIES 12/30/2009   Nonspecific (abnormal) findings on radiological and other examination of body structure 12/30/2009   COMPUTERIZED TOMOGRAPHY, CHEST, ABNORMAL 12/30/2009    Past Medical History:  Diagnosis Date   Anemia    Anxiety    Depression    Fibromyalgia    Hypertension    Osteoarthritis    Pneumonia    PONV (postoperative nausea and vomiting)    Rheumatoid arthritis (HCC)    Vitamin D deficiency     Family History  Problem Relation Age of Onset   Diabetes Mother    Hypertension Mother    Hypertension Father    Hypertension Brother    Osteoarthritis Brother    Hypertension Brother    Osteoarthritis Brother    Endometriosis Daughter    Anesthesia problems Neg Hx    Hypotension Neg Hx    Pseudochol deficiency Neg Hx    Malignant hyperthermia Neg Hx    Past Surgical History:  Procedure Laterality Date   APPENDECTOMY  2010   [facility]   CARPAL TUNNEL RELEASE Left    CHOLECYSTECTOMY  1999   lap, [facility]   GASTRIC BYPASS  2003   [facility]   HERNIA REPAIR  08/2010, 02/2010    incisional, [facility], [facility]   INCISIONAL HERNIA REPAIR  10/12/2010   Procedure: HERNIA REPAIR INCISIONAL;  Surgeon: Dalia Heading;  Location: [facility];  Service: General;  Laterality: N/A;  Recurrent Incisional Hernia Repair with Mesh   KNEE SURGERY Right 01/2018   meniscal tear    KNEE SURGERY Right 12/2019  PANNICULECTOMY  2008   [facility]   SHOULDER SURGERY Left    TENNIS ELBOW RELEASE/NIRSCHEL PROCEDURE Bilateral    TOTAL KNEE ARTHROPLASTY Right 03/31/2022   Procedure: TOTAL KNEE ARTHROPLASTY;  Surgeon: Frederico Hamman, MD;  Location: [facility];  Service: Orthopedics;  Laterality: Right;   Social History   Social History Narrative   Not on file    There is no immunization history on file for this patient.   Objective: Vital Signs: There were no vitals  taken for this visit.   Physical Exam Vitals and nursing note reviewed.  Constitutional:      Appearance: She is well-developed.  HENT:     Head: Normocephalic and atraumatic.  Eyes:     Conjunctiva/sclera: Conjunctivae normal.  Cardiovascular:     Rate and Rhythm: Normal rate and regular rhythm.     Heart sounds: Normal heart sounds.  Pulmonary:     Effort: Pulmonary effort is normal.     Breath sounds: Normal breath sounds.  Abdominal:     General: Bowel sounds are normal.     Palpations: Abdomen is soft.  Musculoskeletal:     Cervical back: Normal range of motion.  Lymphadenopathy:     Cervical: No cervical adenopathy.  Skin:    General: Skin is warm and dry.     Capillary Refill: Capillary refill takes less than 2 seconds.  Neurological:     Mental Status: She is alert and oriented to person, place, and time.  Psychiatric:        Behavior: Behavior normal.      Musculoskeletal Exam: ***  CDAI Exam: CDAI Score: -- Patient Global: --; Provider Global: -- Swollen: --; Tender: -- Joint Exam 01/17/2023   No joint exam has been documented for this visit   There is currently no information documented on the homunculus. Go to the Rheumatology activity and complete the homunculus joint exam.  Investigation: No additional findings.  Imaging: No results found.  Recent Labs: Lab Results  Component Value Date   WBC 6.3 11/24/2022   HGB 10.8 (L) 11/24/2022   PLT 315 11/24/2022   NA 133 (L) 11/24/2022   K 4.4 11/24/2022   CL 99 11/24/2022   CO2 25 11/24/2022   GLUCOSE 93 11/24/2022   BUN 12 11/24/2022   CREATININE 0.86 11/24/2022   BILITOT 0.4 11/24/2022   ALKPHOS 87 03/22/2022   AST 16 11/24/2022   ALT 13 11/24/2022   PROT 6.5 11/24/2022   ALBUMIN 3.4 (L) 03/22/2022   CALCIUM 9.4 11/24/2022   GFRAA 67 09/02/2020   QFTBGOLDPLUS NEGATIVE 03/29/2022    Speciality Comments: Orencia started 08/11/21  Patient has PLQ eye exam scheduled for 12/06/2022.    Procedures:  No procedures performed Allergies: Patient has no known allergies.   Assessment / Plan:     Visit Diagnoses: Seronegative rheumatoid arthritis (HCC)  High risk medication use  Primary osteoarthritis of both hands  Carpal tunnel syndrome, right upper limb  Trochanteric bursitis of both hips  Primary osteoarthritis of left knee  S/P total knee arthroplasty, right  Primary osteoarthritis of both feet  Fibromyalgia  DDD (degenerative disc disease), cervical  Other insomnia  Other fatigue  Vitamin D deficiency  Major depressive disorder, recurrent episode, moderate (HCC)  Orders: No orders of the defined types were placed in this encounter.  No orders of the defined types were placed in this encounter.   Face-to-face time spent with patient was *** minutes. Greater than 50% of time was spent in  counseling and coordination of care.  Follow-Up Instructions: No follow-ups on file.   Gearldine Bienenstock, PA-C  Note - This record has been created using Dragon software.  Chart creation errors have been sought, but may not always  have been located. Such creation errors do not reflect on  the standard of medical care.

## 2023-01-17 ENCOUNTER — Ambulatory Visit: Payer: Medicare Other | Admitting: Physician Assistant

## 2023-01-17 DIAGNOSIS — M5030 Other cervical disc degeneration, unspecified cervical region: Secondary | ICD-10-CM

## 2023-01-17 DIAGNOSIS — E559 Vitamin D deficiency, unspecified: Secondary | ICD-10-CM

## 2023-01-17 DIAGNOSIS — M19041 Primary osteoarthritis, right hand: Secondary | ICD-10-CM

## 2023-01-17 DIAGNOSIS — F331 Major depressive disorder, recurrent, moderate: Secondary | ICD-10-CM

## 2023-01-17 DIAGNOSIS — R5383 Other fatigue: Secondary | ICD-10-CM

## 2023-01-17 DIAGNOSIS — M1712 Unilateral primary osteoarthritis, left knee: Secondary | ICD-10-CM

## 2023-01-17 DIAGNOSIS — Z79899 Other long term (current) drug therapy: Secondary | ICD-10-CM

## 2023-01-17 DIAGNOSIS — Z96651 Presence of right artificial knee joint: Secondary | ICD-10-CM

## 2023-01-17 DIAGNOSIS — G5601 Carpal tunnel syndrome, right upper limb: Secondary | ICD-10-CM

## 2023-01-17 DIAGNOSIS — M7061 Trochanteric bursitis, right hip: Secondary | ICD-10-CM

## 2023-01-17 DIAGNOSIS — M0600 Rheumatoid arthritis without rheumatoid factor, unspecified site: Secondary | ICD-10-CM

## 2023-01-17 DIAGNOSIS — M19072 Primary osteoarthritis, left ankle and foot: Secondary | ICD-10-CM

## 2023-01-17 DIAGNOSIS — M797 Fibromyalgia: Secondary | ICD-10-CM

## 2023-01-17 DIAGNOSIS — G4709 Other insomnia: Secondary | ICD-10-CM

## 2023-01-22 ENCOUNTER — Other Ambulatory Visit: Payer: Self-pay | Admitting: Orthopaedic Surgery

## 2023-01-22 DIAGNOSIS — G5601 Carpal tunnel syndrome, right upper limb: Secondary | ICD-10-CM | POA: Diagnosis not present

## 2023-01-22 HISTORY — PX: CARPAL TUNNEL RELEASE: SHX101

## 2023-01-24 ENCOUNTER — Telehealth: Payer: Self-pay | Admitting: Radiology

## 2023-01-24 ENCOUNTER — Other Ambulatory Visit: Payer: Self-pay | Admitting: Orthopaedic Surgery

## 2023-01-24 NOTE — Telephone Encounter (Signed)
noted 

## 2023-01-24 NOTE — Telephone Encounter (Signed)
Patient called and states that she was not given pain medication post carpal tunnel release due to being seen by pain management. She states that she is having pain, most especially at night, from the surgery. She says this has also caused RA flare.  She has spoken with her pain doctor, who advised you could call her in something for pain. Patient uses [facility].  Please advise.  CB [phone]

## 2023-01-25 NOTE — Telephone Encounter (Signed)
noted 

## 2023-01-31 ENCOUNTER — Encounter: Payer: Self-pay | Admitting: Orthopaedic Surgery

## 2023-01-31 ENCOUNTER — Ambulatory Visit (INDEPENDENT_AMBULATORY_CARE_PROVIDER_SITE_OTHER): Payer: Medicare Other | Admitting: Orthopaedic Surgery

## 2023-01-31 VITALS — Ht 65.0 in | Wt 221.0 lb

## 2023-01-31 DIAGNOSIS — G5601 Carpal tunnel syndrome, right upper limb: Secondary | ICD-10-CM

## 2023-01-31 NOTE — Progress Notes (Signed)
Post-Op Visit Note   Patient: Amanda Hammond           Date of Birth: [birth year]           MRN: [id number] Visit Date: 01/31/2023 PCP: Minus Breeding, PA-C   Assessment & Plan: 9 days post carpal tunnel release.  Incision looks good.  She is already on pain management.  She had an MRI that showed some narrowing C5-6 on the left but not on the right.  She states patient hand is improved.  Return 1 week for suture removal  Chief Complaint:  Chief Complaint  Patient presents with   Right Hand - Routine Post Op    01/22/2023 Right CTR   Visit Diagnoses:  1. Carpal tunnel syndrome, right upper limb            Postop from 01/22/2023 outpatient surgery  Plan: Right wrist splint applied return in 1 week for suture removal.  Follow-Up Instructions: Return in about 1 week (around 02/07/2023).   Orders:  No orders of the defined types were placed in this encounter.  No orders of the defined types were placed in this encounter.   Imaging: No results found.  PMFS History: Patient Active Problem List   Diagnosis Date Noted   Rheumatoid factor positive 11/06/2016   Trochanteric bursitis of both hips 11/06/2016   History of fibromyalgia 11/06/2016   DDD (degenerative disc disease), cervical 11/06/2016   Carpal tunnel syndrome, left upper limb 11/06/2016   Carpal tunnel syndrome, right upper limb 11/06/2016   Trigger finger, left middle finger 11/06/2016   Major depressive disorder, recurrent episode, moderate (HCC) 04/27/2016   Pain in left hand 04/25/2016   Pain in right hand 04/25/2016   Primary osteoarthritis of both hands 04/25/2016   Primary osteoarthritis of both knees 04/25/2016   Primary osteoarthritis of both feet 04/25/2016   Fibromyalgia 02/17/2016   Other insomnia 02/17/2016   Fatigue 02/17/2016   B12 DEFICIENCY 02/01/2010   Vitamin D deficiency 02/01/2010   ANEMIA, IRON DEFICIENCY 12/30/2009   OTHER DYSPNEA AND RESPIRATORY ABNORMALITIES 12/30/2009    Nonspecific (abnormal) findings on radiological and other examination of body structure 12/30/2009   COMPUTERIZED TOMOGRAPHY, CHEST, ABNORMAL 12/30/2009   Past Medical History:  Diagnosis Date   Anemia    Anxiety    Depression    Fibromyalgia    Hypertension    Osteoarthritis    Pneumonia    PONV (postoperative nausea and vomiting)    Rheumatoid arthritis (HCC)    Vitamin D deficiency     Family History  Problem Relation Age of Onset   Diabetes Mother    Hypertension Mother    Hypertension Father    Hypertension Brother    Osteoarthritis Brother    Hypertension Brother    Osteoarthritis Brother    Endometriosis Daughter    Anesthesia problems Neg Hx    Hypotension Neg Hx    Pseudochol deficiency Neg Hx    Malignant hyperthermia Neg Hx     Past Surgical History:  Procedure Laterality Date   APPENDECTOMY  2010   [facility]   CARPAL TUNNEL RELEASE Left    CHOLECYSTECTOMY  1999   lap, [facility]   GASTRIC BYPASS  2003   [facility]   HERNIA REPAIR  08/2010, 02/2010    incisional, [facility], [facility]   INCISIONAL HERNIA REPAIR  10/12/2010   Procedure: HERNIA REPAIR INCISIONAL;  Surgeon: Dalia Heading;  Location: [facility];  Service: General;  Laterality: N/A;  Recurrent  Incisional Hernia Repair with Mesh   KNEE SURGERY Right 01/2018   meniscal tear    KNEE SURGERY Right 12/2019   PANNICULECTOMY  2008   [facility]   SHOULDER SURGERY Left    TENNIS ELBOW RELEASE/NIRSCHEL PROCEDURE Bilateral    TOTAL KNEE ARTHROPLASTY Right 03/31/2022   Procedure: TOTAL KNEE ARTHROPLASTY;  Surgeon: Frederico Hamman, MD;  Location: [facility];  Service: Orthopedics;  Laterality: Right;   Social History   Occupational History   Occupation: disability  Tobacco Use   Smoking status: Never    Passive exposure: Never   Smokeless tobacco: Never  Vaping Use   Vaping status: Never Used  Substance and Sexual Activity   Alcohol use: No   Drug use: Never   Sexual activity: Yes    Birth control/protection: I.U.D.

## 2023-02-07 ENCOUNTER — Encounter: Payer: Medicare Other | Admitting: Orthopaedic Surgery

## 2023-02-07 ENCOUNTER — Telehealth: Payer: Self-pay | Admitting: Pharmacist

## 2023-02-07 NOTE — Telephone Encounter (Signed)
Received notification that patient is due for BMS PAP renewal for Orencia SQ. Patient portion mailed to patient's home. Provider portion and PA renewal will be completed once patient portion is returned   Of note regarding requirements:  If FPL less than 150% of the federal poverty level, patients will be asked to provide proof of denial for the Medicare Part D LIS/Extra Help. They also require proof of OOP rx expenses  Patient has OV on 02/08/2023. Left in Marissa's folder   Chesley Mires, PharmD, MPH, BCPS, CPP Clinical Pharmacist (Rheumatology and Pulmonology)

## 2023-02-08 ENCOUNTER — Ambulatory Visit (INDEPENDENT_AMBULATORY_CARE_PROVIDER_SITE_OTHER): Payer: Medicare Other | Admitting: Orthopaedic Surgery

## 2023-02-08 ENCOUNTER — Encounter: Payer: Self-pay | Admitting: Orthopaedic Surgery

## 2023-02-08 ENCOUNTER — Ambulatory Visit: Payer: Medicare Other | Admitting: Physician Assistant

## 2023-02-08 VITALS — Ht 65.0 in | Wt 221.0 lb

## 2023-02-08 DIAGNOSIS — G5601 Carpal tunnel syndrome, right upper limb: Secondary | ICD-10-CM

## 2023-02-08 NOTE — Progress Notes (Signed)
Post-Op Visit Note   Patient: Amanda Hammond           Date of Birth: 01-05-1971           MRN: [id number] Visit Date: 02/08/2023 PCP: Minus Breeding, PA-C   Assessment & Plan: Follow-up carpal tunnel release right wrist.  1 suture came out the remaining sutures harvested today.  On her left hand she has small mass adjacent to the flexor tendon which is painful tender with gripping and squeezing she has had a little bit of catching but no true active triggering.  She states she would like to have the cyst removed at some point but wants to wait the right hand is doing well and I will recheck her again in 4 weeks.  Chief Complaint:  Chief Complaint  Patient presents with   Right Hand - Routine Post Op     01/22/2023 Right CTR     Visit Diagnoses: No diagnosis found.  Plan: Return in 4 weeks to check her opposite left hand mass adjacent to the long finger flexor tendon.  Follow-Up Instructions: No follow-ups on file.   Orders:  No orders of the defined types were placed in this encounter.  No orders of the defined types were placed in this encounter.   Imaging: No results found.  PMFS History: Patient Active Problem List   Diagnosis Date Noted   Rheumatoid factor positive 11/06/2016   Trochanteric bursitis of both hips 11/06/2016   History of fibromyalgia 11/06/2016   DDD (degenerative disc disease), cervical 11/06/2016   Carpal tunnel syndrome, left upper limb 11/06/2016   Carpal tunnel syndrome, right upper limb 11/06/2016   Trigger finger, left middle finger 11/06/2016   Major depressive disorder, recurrent episode, moderate (HCC) 04/27/2016   Pain in left hand 04/25/2016   Pain in right hand 04/25/2016   Primary osteoarthritis of both hands 04/25/2016   Primary osteoarthritis of both knees 04/25/2016   Primary osteoarthritis of both feet 04/25/2016   Fibromyalgia 02/17/2016   Other insomnia 02/17/2016   Fatigue 02/17/2016   B12 DEFICIENCY 02/01/2010    Vitamin D deficiency 02/01/2010   ANEMIA, IRON DEFICIENCY 12/30/2009   OTHER DYSPNEA AND RESPIRATORY ABNORMALITIES 12/30/2009   Nonspecific (abnormal) findings on radiological and other examination of body structure 12/30/2009   COMPUTERIZED TOMOGRAPHY, CHEST, ABNORMAL 12/30/2009   Past Medical History:  Diagnosis Date   Anemia    Anxiety    Depression    Fibromyalgia    Hypertension    Osteoarthritis    Pneumonia    PONV (postoperative nausea and vomiting)    Rheumatoid arthritis (HCC)    Vitamin D deficiency     Family History  Problem Relation Age of Onset   Diabetes Mother    Hypertension Mother    Hypertension Father    Hypertension Brother    Osteoarthritis Brother    Hypertension Brother    Osteoarthritis Brother    Endometriosis Daughter    Anesthesia problems Neg Hx    Hypotension Neg Hx    Pseudochol deficiency Neg Hx    Malignant hyperthermia Neg Hx     Past Surgical History:  Procedure Laterality Date   APPENDECTOMY  2010   [facility]   CARPAL TUNNEL RELEASE Left    CHOLECYSTECTOMY  1999   lap, [facility]   GASTRIC BYPASS  2003   [facility]   HERNIA REPAIR  08/2010, 02/2010    incisional, [facility], [facility]   INCISIONAL HERNIA REPAIR  10/12/2010  Procedure: HERNIA REPAIR INCISIONAL;  Surgeon: Dalia Heading;  Location: [facility];  Service: General;  Laterality: N/A;  Recurrent Incisional Hernia Repair with Mesh   KNEE SURGERY Right 01/2018   meniscal tear    KNEE SURGERY Right 12/2019   PANNICULECTOMY  2008   [facility]   SHOULDER SURGERY Left    TENNIS ELBOW RELEASE/NIRSCHEL PROCEDURE Bilateral    TOTAL KNEE ARTHROPLASTY Right 03/31/2022   Procedure: TOTAL KNEE ARTHROPLASTY;  Surgeon: Frederico Hamman, MD;  Location: [facility];  Service: Orthopedics;  Laterality: Right;   Social History   Occupational History   Occupation: disability  Tobacco Use   Smoking status: Never    Passive exposure: Never   Smokeless tobacco: Never  Vaping Use   Vaping status: Never Used  Substance and  Sexual Activity   Alcohol use: No   Drug use: Never   Sexual activity: Yes    Birth control/protection: I.U.D.

## 2023-02-12 NOTE — Telephone Encounter (Signed)
Patient cancelled appt that was on 02/08/2023. Will mail renewal application to her home  Amanda Hammond, PharmD, MPH, BCPS, CPP Clinical Pharmacist (Rheumatology and Pulmonology)

## 2023-02-16 DIAGNOSIS — M069 Rheumatoid arthritis, unspecified: Secondary | ICD-10-CM | POA: Diagnosis not present

## 2023-02-16 DIAGNOSIS — M47816 Spondylosis without myelopathy or radiculopathy, lumbar region: Secondary | ICD-10-CM | POA: Diagnosis not present

## 2023-02-16 DIAGNOSIS — M1711 Unilateral primary osteoarthritis, right knee: Secondary | ICD-10-CM | POA: Diagnosis not present

## 2023-02-16 DIAGNOSIS — M797 Fibromyalgia: Secondary | ICD-10-CM | POA: Diagnosis not present

## 2023-02-20 NOTE — Telephone Encounter (Signed)
Ok to send in prednisone 20 mg tapering by 5 mg every 4 days.

## 2023-02-21 MED ORDER — PREDNISONE 5 MG PO TABS: ORAL_TABLET | ORAL | 0 refills | Status: DC

## 2023-02-27 ENCOUNTER — Ambulatory Visit: Payer: Medicare Other | Attending: Rheumatology | Admitting: Rheumatology

## 2023-02-27 ENCOUNTER — Encounter: Payer: Self-pay | Admitting: Rheumatology

## 2023-02-27 VITALS — BP 132/79 | HR 82 | Resp 16 | Ht 65.0 in | Wt 234.0 lb

## 2023-02-27 DIAGNOSIS — M19042 Primary osteoarthritis, left hand: Secondary | ICD-10-CM

## 2023-02-27 DIAGNOSIS — M7061 Trochanteric bursitis, right hip: Secondary | ICD-10-CM

## 2023-02-27 DIAGNOSIS — M0600 Rheumatoid arthritis without rheumatoid factor, unspecified site: Secondary | ICD-10-CM

## 2023-02-27 DIAGNOSIS — M19041 Primary osteoarthritis, right hand: Secondary | ICD-10-CM | POA: Diagnosis not present

## 2023-02-27 DIAGNOSIS — Z79899 Other long term (current) drug therapy: Secondary | ICD-10-CM

## 2023-02-27 DIAGNOSIS — M62838 Other muscle spasm: Secondary | ICD-10-CM | POA: Diagnosis not present

## 2023-02-27 DIAGNOSIS — R5383 Other fatigue: Secondary | ICD-10-CM

## 2023-02-27 DIAGNOSIS — M797 Fibromyalgia: Secondary | ICD-10-CM | POA: Diagnosis not present

## 2023-02-27 DIAGNOSIS — E559 Vitamin D deficiency, unspecified: Secondary | ICD-10-CM | POA: Diagnosis not present

## 2023-02-27 DIAGNOSIS — M5030 Other cervical disc degeneration, unspecified cervical region: Secondary | ICD-10-CM

## 2023-02-27 DIAGNOSIS — U071 COVID-19: Secondary | ICD-10-CM

## 2023-02-27 DIAGNOSIS — M19072 Primary osteoarthritis, left ankle and foot: Secondary | ICD-10-CM

## 2023-02-27 DIAGNOSIS — M7062 Trochanteric bursitis, left hip: Secondary | ICD-10-CM

## 2023-02-27 DIAGNOSIS — G4709 Other insomnia: Secondary | ICD-10-CM

## 2023-02-27 DIAGNOSIS — Z96651 Presence of right artificial knee joint: Secondary | ICD-10-CM | POA: Diagnosis not present

## 2023-02-27 DIAGNOSIS — M1712 Unilateral primary osteoarthritis, left knee: Secondary | ICD-10-CM

## 2023-02-27 DIAGNOSIS — M19071 Primary osteoarthritis, right ankle and foot: Secondary | ICD-10-CM

## 2023-02-27 DIAGNOSIS — F331 Major depressive disorder, recurrent, moderate: Secondary | ICD-10-CM

## 2023-02-27 MED ORDER — LIDOCAINE HCL 1 % IJ SOLN
0.5000 mL | INTRAMUSCULAR | Status: AC | PRN
  Administered 2023-02-27: .5 mL

## 2023-02-27 MED ORDER — ORENCIA CLICKJECT 125 MG/ML ~~LOC~~ SOAJ
125.0000 mg | SUBCUTANEOUS | 0 refills | Status: DC
Start: 2023-02-27 — End: 2023-03-19

## 2023-02-27 MED ORDER — TRIAMCINOLONE ACETONIDE 40 MG/ML IJ SUSP
20.0000 mg | INTRAMUSCULAR | Status: AC | PRN
  Administered 2023-02-27: 20 mg via INTRAMUSCULAR

## 2023-02-27 MED ORDER — HYDROXYCHLOROQUINE SULFATE 200 MG PO TABS
200.0000 mg | ORAL_TABLET | Freq: Two times a day (BID) | ORAL | 0 refills | Status: DC
Start: 2023-02-27 — End: 2023-09-12

## 2023-02-27 NOTE — Patient Instructions (Signed)
Standing Labs We placed an order today for your standing lab work.   Please have your standing labs drawn in February and every 3 months  Please have your labs drawn 2 weeks prior to your appointment so that the provider can discuss your lab results at your appointment, if possible.  Please note that you may see your imaging and lab results in MyChart before we have reviewed them. We will contact you once all results are reviewed. Please allow our office up to 72 hours to thoroughly review all of the results before contacting the office for clarification of your results.  WALK-IN LAB HOURS  Monday through Thursday from 8:00 am -12:30 pm and 1:00 pm-5:00 pm and Friday from 8:00 am-12:00 pm.  Patients with office visits requiring labs will be seen before walk-in labs.  You may encounter longer than normal wait times. Please allow additional time. Wait times may be shorter on  Monday and Thursday afternoons.  We do not book appointments for walk-in labs. We appreciate your patience and understanding with our staff.   Labs are drawn by [facility]. Please bring your co-pay at the time of your lab draw.  You may receive a bill from [facility] for your lab work.  Please note if you are on Hydroxychloroquine and and an order has been placed for a Hydroxychloroquine level,  you will need to have it drawn 4 hours or more after your last dose.  If you wish to have your labs drawn at another location, please call the office 24 hours in advance so we can fax the orders.  The office is located at [address], [place], [place] [address]   If you have any questions regarding directions or hours of operation,  please call [phone].   As a reminder, please drink plenty of water prior to coming for your lab work. Thanks!   Vaccines You are taking a medication(s) that can suppress your immune system.  The following immunizations are recommended: Flu annually Covid-19  RSV Td/Tdap (tetanus,  diphtheria, pertussis) every 10 years Pneumonia (Prevnar 15 then Pneumovax 23 at least 1 year apart.  Alternatively, can take Prevnar 20 without needing additional dose) Shingrix: 2 doses from 4 weeks to 6 months apart  Please check with your PCP to make sure you are up to date.  If you have signs or symptoms of an infection or start antibiotics: First, call your PCP for workup of your infection. Hold your medication through the infection, until you complete your antibiotics, and until symptoms resolve if you take the following: Injectable medication (Actemra, Benlysta, Cimzia, Cosentyx, Enbrel, Humira, Kevzara, Orencia, Remicade, Simponi, Stelara, Taltz, Tremfya) Methotrexate Leflunomide (Arava) Mycophenolate (Cellcept) Harriette Ohara, Olumiant, or Rinvoq

## 2023-02-27 NOTE — Progress Notes (Signed)
Office Visit Note  Patient: Amanda Hammond             Date of Birth: 1970-04-27           MRN: [id number]             PCP: Minus Breeding, PA-C Referring: Minus Breeding, [place] Visit Date: 02/27/2023 Occupation: @GUAROCC @  Subjective:  Increased pain and swelling in joints.  History of Present Illness: Amanda Hammond is a 52 y.o. female with seronegative rheumatoid arthritis, osteoarthritis and fibromyalgia syndrome.  She returns today after her last visit in March 2024.  Patient states that she has been out of Plaquenil and Orencia for almost 2 months.  Her eye examination for hydroxychloroquine is scheduled for March 06, 2023.  She states her Orencia prescription written out and she could not get a refill.  She called the office and got a prednisone taper which is helping.  She has been having increased pain and swelling in her hands and her feet.  She continues to have some discomfort in the trochanteric region.  She continues to have discomfort in her knee joints.  She also has generalized pain from fibromyalgia.  She has been experiencing increased the trapezius discomfort.  Patient states she developed COVID-19 virus infection in October 2024.  She was not treated with any antiviral agents.  She had a cough which lasted for about 3 weeks and was treated with antibiotics and inhalers.    Activities of Daily Living:  Patient reports morning stiffness for 5 hours.   Patient Reports nocturnal pain.  Difficulty dressing/grooming: Reports Difficulty climbing stairs: Reports Difficulty getting out of chair: Denies Difficulty using hands for taps, buttons, cutlery, and/or writing: Reports  Review of Systems  Constitutional:  Positive for fatigue.  HENT:  Positive for mouth dryness. Negative for mouth sores.   Eyes:  Negative for dryness.  Respiratory:  Negative for shortness of breath.   Cardiovascular:  Positive for palpitations. Negative for chest pain.   Gastrointestinal:  Positive for constipation and diarrhea. Negative for blood in stool.  Endocrine: Positive for increased urination.  Genitourinary:  Positive for involuntary urination.  Musculoskeletal:  Positive for joint pain, gait problem, joint pain, joint swelling, myalgias, muscle weakness, morning stiffness, muscle tenderness and myalgias.  Skin:  Negative for color change, rash, hair loss and sensitivity to sunlight.  Allergic/Immunologic: Positive for susceptible to infections.  Neurological:  Positive for dizziness and headaches.  Hematological:  Negative for swollen glands.  Psychiatric/Behavioral:  Positive for depressed mood and sleep disturbance. The patient is nervous/anxious.     PMFS History:  Patient Active Problem List   Diagnosis Date Noted   Rheumatoid factor positive 11/06/2016   Trochanteric bursitis of both hips 11/06/2016   History of fibromyalgia 11/06/2016   DDD (degenerative disc disease), cervical 11/06/2016   Carpal tunnel syndrome, left upper limb 11/06/2016   Carpal tunnel syndrome, right upper limb 11/06/2016   Trigger finger, left middle finger 11/06/2016   Major depressive disorder, recurrent episode, moderate (HCC) 04/27/2016   Pain in left hand 04/25/2016   Pain in right hand 04/25/2016   Primary osteoarthritis of both hands 04/25/2016   Primary osteoarthritis of both knees 04/25/2016   Primary osteoarthritis of both feet 04/25/2016   Fibromyalgia 02/17/2016   Other insomnia 02/17/2016   Fatigue 02/17/2016   B12 DEFICIENCY 02/01/2010   Vitamin D deficiency 02/01/2010   ANEMIA, IRON DEFICIENCY 12/30/2009   OTHER DYSPNEA AND RESPIRATORY ABNORMALITIES 12/30/2009  Nonspecific (abnormal) findings on radiological and other examination of body structure 12/30/2009   COMPUTERIZED TOMOGRAPHY, CHEST, ABNORMAL 12/30/2009    Past Medical History:  Diagnosis Date   Anemia    Anxiety    Depression    Fibromyalgia    Hypertension     Osteoarthritis    Pneumonia    PONV (postoperative nausea and vomiting)    Rheumatoid arthritis (HCC)    Vitamin D deficiency     Family History  Problem Relation Age of Onset   Diabetes Mother    Hypertension Mother    Hypertension Father    Hypertension Brother    Osteoarthritis Brother    Hypertension Brother    Osteoarthritis Brother    Endometriosis Daughter    Anesthesia problems Neg Hx    Hypotension Neg Hx    Pseudochol deficiency Neg Hx    Malignant hyperthermia Neg Hx    Past Surgical History:  Procedure Laterality Date   APPENDECTOMY  2010   [facility]   CARPAL TUNNEL RELEASE Left    CHOLECYSTECTOMY  1999   lap, [facility]   GASTRIC BYPASS  2003   [facility]   HERNIA REPAIR  08/2010, 02/2010    incisional, [facility], [facility]   INCISIONAL HERNIA REPAIR  10/12/2010   Procedure: HERNIA REPAIR INCISIONAL;  Surgeon: Dalia Heading;  Location: [facility];  Service: General;  Laterality: N/A;  Recurrent Incisional Hernia Repair with Mesh   KNEE SURGERY Right 01/2018   meniscal tear    KNEE SURGERY Right 12/2019   PANNICULECTOMY  2008   [facility]   SHOULDER SURGERY Left    TENNIS ELBOW RELEASE/NIRSCHEL PROCEDURE Bilateral    TOTAL KNEE ARTHROPLASTY Right 03/31/2022   Procedure: TOTAL KNEE ARTHROPLASTY;  Surgeon: Frederico Hamman, MD;  Location: [facility];  Service: Orthopedics;  Laterality: Right;   Social History   Social History Narrative   Not on file    There is no immunization history on file for this patient.   Objective: Vital Signs: BP 132/79 (BP Location: Left Arm, Patient Position: Sitting, Cuff Size: Normal)   Pulse 82   Resp 16   Ht 5\' 5"  (1.651 m)   Wt 234 lb (106.1 kg)   BMI 38.94 kg/m    Physical Exam Vitals and nursing note reviewed.  Constitutional:      Appearance: She is well-developed.  HENT:     Head: Normocephalic and atraumatic.  Eyes:     Conjunctiva/sclera: Conjunctivae normal.  Cardiovascular:     Rate and Rhythm: Normal rate and regular rhythm.     Heart  sounds: Normal heart sounds.  Pulmonary:     Effort: Pulmonary effort is normal.     Breath sounds: Normal breath sounds.  Abdominal:     General: Bowel sounds are normal.     Palpations: Abdomen is soft.  Musculoskeletal:     Cervical back: Normal range of motion.  Lymphadenopathy:     Cervical: No cervical adenopathy.  Skin:    General: Skin is warm and dry.     Capillary Refill: Capillary refill takes less than 2 seconds.  Neurological:     Mental Status: She is alert and oriented to person, place, and time.  Psychiatric:        Behavior: Behavior normal.      Musculoskeletal Exam: Patient had painful range of motion of the cervical spine.  She had bilateral trapezius spasm.  She had painful range of motion of her lumbar spine.  Shoulder joints were  in good range of motion with discomfort.  Elbow joints and wrist joints with good range of motion.  She had tenderness over MCPs and PIPs without any synovitis.  Synovial thickening was noted.  Hip joints were in good range of motion.  She had discomfort on palpation over trochanteric region bilaterally.  Right knee joint was replaced with some warmth on palpation.  Left knee joint was in good range of motion.  There was no tenderness over ankles or MTPs.  CDAI Exam: CDAI Score: 21  Patient Global: 80 / 100; Provider Global: 60 / 100 Swollen: 0 ; Tender: 8  Joint Exam 02/27/2023      Right  Left  Glenohumeral   Tender   Tender  MCP 2   Tender   Tender  MCP 3   Tender   Tender  Lumbar Spine   Tender     Knee   Tender        Investigation: No additional findings.  Imaging: No results found.  Recent Labs: Lab Results  Component Value Date   WBC 6.3 11/24/2022   HGB 10.8 (L) 11/24/2022   PLT 315 11/24/2022   NA 133 (L) 11/24/2022   K 4.4 11/24/2022   CL 99 11/24/2022   CO2 25 11/24/2022   GLUCOSE 93 11/24/2022   BUN 12 11/24/2022   CREATININE 0.86 11/24/2022   BILITOT 0.4 11/24/2022   ALKPHOS 87 03/22/2022   AST  16 11/24/2022   ALT 13 11/24/2022   PROT 6.5 11/24/2022   ALBUMIN 3.4 (L) 03/22/2022   CALCIUM 9.4 11/24/2022   GFRAA 67 09/02/2020   QFTBGOLDPLUS NEGATIVE 03/29/2022    Speciality Comments: Orencia started 08/11/21  Patient does not have PLQ eye exam scheduled  Procedures:  Trigger Point Inj  Date/Time: 02/27/2023 4:00 PM  Performed by: Pollyann Savoy, MD Authorized by: Pollyann Savoy, MD   Consent Given by:  Patient Site marked: the procedure site was marked   Timeout: prior to procedure the correct patient, procedure, and site was verified   Indications:  Muscle spasm and pain Total # of Trigger Points:  2 Location: neck   Needle Size:  27 G Approach:  Dorsal Medications #1:  0.5 mL lidocaine 1 %; 20 mg triamcinolone acetonide 40 MG/ML Medications #2:  0.5 mL lidocaine 1 %; 20 mg triamcinolone acetonide 40 MG/ML Patient tolerance:  Patient tolerated the procedure well with no immediate complications  Allergies: Patient has no known allergies.   Assessment / Plan:     Visit Diagnoses: Seronegative rheumatoid arthritis (HCC) -patient had a recent flare of rheumatoid arthritis.  She is states that she ran out of medications for about 2 months.  She will get labs today and will be resuming medications.  She states she developed COVID-19 virus infection in October.  Patient was given a prednisone taper which has helped.  She had tenderness over multiple joints as described above.  No synovitis on the examination.  Patient will be resuming hydroxychloroquine and Orencia.  Plan: hydroxychloroquine (PLAQUENIL) 200 MG tablet, Abatacept (ORENCIA CLICKJECT) 125 MG/ML SOAJ  High risk medication use - Orencia 125 mg subcutaneous injections once weekly, Arava 20 mg daily, and Plaquenil 200 mg 1 tablet by mouth twice daily. -Labs obtained August 2024 were reviewed.  She was anemic with hemoglobin of 10.8.  TB Gold was negative in December 2023.  Will check labs today including TB Gold.   She was advised to get labs every 3 months.  Information on the immunization  was placed in the AVS.  She was advised to hold Orencia if she develops an infection and resume after the infection resolves.  Plan: hydroxychloroquine (PLAQUENIL) 200 MG tablet, Abatacept (ORENCIA CLICKJECT) 125 MG/ML SOAJ, CBC with Differential/Platelet, COMPLETE METABOLIC PANEL WITH GFR, QuantiFERON-TB Gold Plus  Primary osteoarthritis of both hands-she has rheumatoid arthritis and osteoarthritis overlap with recent flare and increased pain and swelling.  Trochanteric bursitis of both hips-she continues to have some discomfort over trochanteric region.  IT band stretches were discussed.  Primary osteoarthritis of left knee-chronic pain.  S/P total knee arthroplasty, right - Dr. Christian Mate 03/31/22: She continues to have discomfort in her right knee.  Primary osteoarthritis of both feet-she comp history of increased pain in her feet recently.  Fibromyalgia -she has generalized pain and discomfort from fibromyalgia.  She has positive tender points.  She is followed by pain management.  DDD (degenerative disc disease), cervical-she had limited painful range of motion of the cervical spine.  Trapezius muscle spasm-patient requested trigger point injections.  After informed consent was obtained bilateral point were injected as described above.  She tolerated the procedure well.  Other insomnia-sleep hygiene was discussed.  Other fatigue-related to fibromyalgia and insomnia.  Vitamin D deficiency  Major depressive disorder, recurrent episode, moderate (HCC)  Orders: Orders Placed This Encounter  Procedures   Trigger Point Inj   CBC with Differential/Platelet   COMPLETE METABOLIC PANEL WITH GFR   QuantiFERON-TB Gold Plus   Meds ordered this encounter  Medications   hydroxychloroquine (PLAQUENIL) 200 MG tablet    Sig: Take 1 tablet (200 mg total) by mouth 2 (two) times daily.    Dispense:  60 tablet    Refill:   0   Abatacept (ORENCIA CLICKJECT) 125 MG/ML SOAJ    Sig: Inject 125 mg into the skin once a week.    Dispense:  4 mL    Refill:  0     Follow-Up Instructions: Return in about 3 months (around 05/30/2023) for Rheumatoid arthritis, Osteoarthritis.   Pollyann Savoy, MD  Note - This record has been created using Animal nutritionist.  Chart creation errors have been sought, but may not always  have been located. Such creation errors do not reflect on  the standard of medical care.

## 2023-02-28 NOTE — Progress Notes (Signed)
CMP is normal.  Hemoglobin is low at 10.3 and stable.  Please forward results to her PCP.  Patient should take multivitamin with iron.

## 2023-03-02 LAB — COMPLETE METABOLIC PANEL WITH GFR
AG Ratio: 1.8 (calc) (ref 1.0–2.5)
ALT: 14 U/L (ref 6–29)
AST: 14 U/L (ref 10–35)
Albumin: 4 g/dL (ref 3.6–5.1)
Alkaline phosphatase (APISO): 104 U/L (ref 37–153)
BUN: 12 mg/dL (ref 7–25)
CO2: 31 mmol/L (ref 20–32)
Calcium: 8.7 mg/dL (ref 8.6–10.4)
Chloride: 102 mmol/L (ref 98–110)
Creat: 0.79 mg/dL (ref 0.50–1.03)
Globulin: 2.2 g/dL (ref 1.9–3.7)
Glucose, Bld: 107 mg/dL — ABNORMAL HIGH (ref 65–99)
Potassium: 4.2 mmol/L (ref 3.5–5.3)
Sodium: 137 mmol/L (ref 135–146)
Total Bilirubin: 0.4 mg/dL (ref 0.2–1.2)
Total Protein: 6.2 g/dL (ref 6.1–8.1)
eGFR: 91 mL/min/{1.73_m2} (ref >=60)

## 2023-03-02 LAB — CBC WITH DIFFERENTIAL/PLATELET
Absolute Lymphocytes: 1610 {cells}/uL (ref 850–3900)
Absolute Monocytes: 422 {cells}/uL (ref 200–950)
Basophils Absolute: 33 {cells}/uL (ref 0–200)
Basophils Relative: 0.5 %
Eosinophils Absolute: 59 {cells}/uL (ref 15–500)
Eosinophils Relative: 0.9 %
HCT: 33.6 % — ABNORMAL LOW (ref 35.0–45.0)
Hemoglobin: 10.3 g/dL — ABNORMAL LOW (ref 11.7–15.5)
MCH: 24.7 pg — ABNORMAL LOW (ref 27.0–33.0)
MCHC: 30.7 g/dL — ABNORMAL LOW (ref 32.0–36.0)
MCV: 80.6 fL (ref 80.0–100.0)
MPV: 11.5 fL (ref 7.5–12.5)
Monocytes Relative: 6.4 %
Neutro Abs: 4475 {cells}/uL (ref 1500–7800)
Neutrophils Relative %: 67.8 %
Platelets: 291 10*3/uL (ref 140–400)
RBC: 4.17 10*6/uL (ref 3.80–5.10)
RDW: 14.8 % (ref 11.0–15.0)
Total Lymphocyte: 24.4 %
WBC: 6.6 10*3/uL (ref 3.8–10.8)

## 2023-03-02 LAB — QUANTIFERON-TB GOLD PLUS
Mitogen-NIL: 8.03 [IU]/mL
NIL: 0.04 [IU]/mL
QuantiFERON-TB Gold Plus: NEGATIVE
TB1-NIL: 0 [IU]/mL
TB2-NIL: <0 [IU]/mL

## 2023-03-05 NOTE — Progress Notes (Signed)
TB Gold is negative.  Patient should see her PCP for the evaluation of anemia.

## 2023-03-08 ENCOUNTER — Ambulatory Visit (INDEPENDENT_AMBULATORY_CARE_PROVIDER_SITE_OTHER): Payer: Medicare Other | Admitting: Orthopaedic Surgery

## 2023-03-08 ENCOUNTER — Encounter: Payer: Self-pay | Admitting: Orthopaedic Surgery

## 2023-03-08 VITALS — Ht 65.0 in | Wt 234.0 lb

## 2023-03-08 DIAGNOSIS — G5601 Carpal tunnel syndrome, right upper limb: Secondary | ICD-10-CM

## 2023-03-08 DIAGNOSIS — G5602 Carpal tunnel syndrome, left upper limb: Secondary | ICD-10-CM

## 2023-03-08 DIAGNOSIS — M65332 Trigger finger, left middle finger: Secondary | ICD-10-CM

## 2023-03-08 NOTE — Progress Notes (Signed)
Office Visit Note   Patient: Amanda Hammond           Date of Birth: Jul 03, 1970           MRN: [id number] Visit Date: 03/08/2023              Requested by: Minus Breeding, PA-C [address] [place],  [place] [address] PCP: Minus Breeding, [place]   Assessment & Plan: Visit Diagnoses:  1. Trigger finger, left middle finger   2. Carpal tunnel syndrome, left upper limb   3. Carpal tunnel syndrome, right upper limb     Plan: Patient postop carpal tunnel release right doing well.  Trigger finger injection performed which she tolerated well.  She has persistent triggering she will let us know.  Follow-Up Instructions: No follow-ups on file.   Orders:  Orders Placed This Encounter  Procedures   Hand/UE Inj   No orders of the defined types were placed in this encounter.     Procedures: Hand/UE Inj: L long A1 for trigger finger on 03/09/2023 11:00 AM Medications: 0.5 mL lidocaine 1 %; 0.5 mL bupivacaine 0.25 %; 20 mg methylPREDNISolone acetate 40 MG/ML      Clinical Data: No additional findings.   Subjective: Chief Complaint  Patient presents with   Right Hand - Follow-up        01/22/2023 Right CTR     Left Hand - Follow-up    HPI patient post right carpal tunnel release 01/22/2023 doing well.  She has had problems with left long finger catching with trigger finger tenosynovitis.  Today she is not actively locking but has had locking before and has tenderness directly over the A1 pulley.  Review of Systems unchanged   Objective: Vital Signs: Ht 5\' 5"  (1.651 m)   Wt 234 lb (106.1 kg)   BMI 38.94 kg/m   Physical Exam  Ortho Exam well-healed right and left carpal tunnel release incision.  Tenderness over the A1 pulley left long finger with mild catching but no true locking.  Specialty Comments:  No specialty comments available.  Imaging: No results found.   PMFS History: Patient Active Problem List   Diagnosis Date Noted   Rheumatoid  factor positive 11/06/2016   Trochanteric bursitis of both hips 11/06/2016   History of fibromyalgia 11/06/2016   DDD (degenerative disc disease), cervical 11/06/2016   Carpal tunnel syndrome, left upper limb 11/06/2016   Carpal tunnel syndrome, right upper limb 11/06/2016   Trigger finger, left middle finger 11/06/2016   Major depressive disorder, recurrent episode, moderate (HCC) 04/27/2016   Pain in left hand 04/25/2016   Pain in right hand 04/25/2016   Primary osteoarthritis of both hands 04/25/2016   Primary osteoarthritis of both knees 04/25/2016   Primary osteoarthritis of both feet 04/25/2016   Fibromyalgia 02/17/2016   Other insomnia 02/17/2016   Fatigue 02/17/2016   B12 DEFICIENCY 02/01/2010   Vitamin D deficiency 02/01/2010   ANEMIA, IRON DEFICIENCY 12/30/2009   OTHER DYSPNEA AND RESPIRATORY ABNORMALITIES 12/30/2009   Nonspecific (abnormal) findings on radiological and other examination of body structure 12/30/2009   COMPUTERIZED TOMOGRAPHY, CHEST, ABNORMAL 12/30/2009   Past Medical History:  Diagnosis Date   Anemia    Anxiety    Depression    Fibromyalgia    Hypertension    Osteoarthritis    Pneumonia    PONV (postoperative nausea and vomiting)    Rheumatoid arthritis (HCC)    Vitamin D deficiency  Family History  Problem Relation Age of Onset   Diabetes Mother    Hypertension Mother    Hypertension Father    Hypertension Brother    Osteoarthritis Brother    Hypertension Brother    Osteoarthritis Brother    Endometriosis Daughter    Anesthesia problems Neg Hx    Hypotension Neg Hx    Pseudochol deficiency Neg Hx    Malignant hyperthermia Neg Hx     Past Surgical History:  Procedure Laterality Date   APPENDECTOMY  2010   [facility]   CARPAL TUNNEL RELEASE Left    CHOLECYSTECTOMY  1999   lap, [facility]   GASTRIC BYPASS  2003   [facility]   HERNIA REPAIR  08/2010, 02/2010    incisional, [facility], [facility]   INCISIONAL HERNIA REPAIR  10/12/2010   Procedure: HERNIA REPAIR  INCISIONAL;  Surgeon: Dalia Heading;  Location: [facility];  Service: General;  Laterality: N/A;  Recurrent Incisional Hernia Repair with Mesh   KNEE SURGERY Right 01/2018   meniscal tear    KNEE SURGERY Right 12/2019   PANN[facility]OMY  [facility]   [facility]   SHOULDER SURGERY Left    TENNIS ELBOW RELEASE/NIRSCHEL PROCEDURE Bilateral    TOTAL KNEE ARTHROPLASTY Right 03/31/2022   Procedure: TOTAL KNEE ARTHROPLASTY;  Surgeon: Frederico Hamman, MD;  Location: [facility];  Service: Orthopedics;  Laterality: Right;   Social History   Occupational History   Occupation: disability  Tobacco Use   Smoking status: Never    Passive exposure: Never   Smokeless tobacco: Never  Vaping Use   Vaping status: Never Used  Substance and Sexual Activity   Alcohol use: No   Drug use: Never   Sexual activity: Yes    Birth control/protection: I.U.D.

## 2023-03-09 DIAGNOSIS — M65332 Trigger finger, left middle finger: Secondary | ICD-10-CM | POA: Diagnosis not present

## 2023-03-09 MED ORDER — METHYLPREDNISOLONE ACETATE 40 MG/ML IJ SUSP
20.0000 mg | INTRAMUSCULAR | Status: AC | PRN
  Administered 2023-03-09: 20 mg

## 2023-03-09 MED ORDER — LIDOCAINE HCL 1 % IJ SOLN
0.5000 mL | INTRAMUSCULAR | Status: AC | PRN
  Administered 2023-03-09: .5 mL

## 2023-03-09 MED ORDER — BUPIVACAINE HCL 0.25 % IJ SOLN
0.5000 mL | INTRAMUSCULAR | Status: AC | PRN
  Administered 2023-03-09: .5 mL

## 2023-03-19 ENCOUNTER — Other Ambulatory Visit: Payer: Self-pay | Admitting: *Deleted

## 2023-03-19 DIAGNOSIS — Z79899 Other long term (current) drug therapy: Secondary | ICD-10-CM

## 2023-03-19 DIAGNOSIS — M0600 Rheumatoid arthritis without rheumatoid factor, unspecified site: Secondary | ICD-10-CM

## 2023-03-19 MED ORDER — ORENCIA CLICKJECT 125 MG/ML ~~LOC~~ SOAJ: 125.0000 mg | SUBCUTANEOUS | 0 refills | Status: DC

## 2023-03-19 NOTE — Telephone Encounter (Signed)
Last Fill: 02/27/2023 4ml  Labs: 02/27/2023  CMP is normal.  Hemoglobin is low at 10.3 and stable.  Please forward results to her PCP.  Patient should take multivitamin with iron. Patient should see her PCP for the evaluation of anemia.    TB Gold: 10/25/2022  TB Gold is negative.   Next Visit: 05/31/2023  Last Visit: 02/27/2023   WN:[id number] rheumatoid arthritis   Current Dose per office note 02/27/2023: Orencia 125 mg subcutaneous injections once weekly   Okay to refill Orencia?

## 2023-03-22 ENCOUNTER — Other Ambulatory Visit: Payer: Self-pay | Admitting: *Deleted

## 2023-03-22 MED ORDER — LEFLUNOMIDE 20 MG PO TABS: 20.0000 mg | ORAL_TABLET | Freq: Every day | ORAL | 0 refills | Status: DC

## 2023-03-22 NOTE — Telephone Encounter (Signed)
Last Fill: 12/19/2022  Labs: 02/27/2023 CMP is normal.  Hemoglobin is low at 10.3 and stable.   Next Visit: 05/31/2023  Last Visit: 02/25/2023  DX: Seronegative rheumatoid arthritis   Current Dose per office note 02/27/2023: Arava 20 mg daily   Okay to refill Arava ?

## 2023-04-09 DIAGNOSIS — J069 Acute upper respiratory infection, unspecified: Secondary | ICD-10-CM | POA: Diagnosis not present

## 2023-04-09 DIAGNOSIS — R059 Cough, unspecified: Secondary | ICD-10-CM | POA: Diagnosis not present

## 2023-05-21 DIAGNOSIS — M2550 Pain in unspecified joint: Secondary | ICD-10-CM | POA: Diagnosis not present

## 2023-05-21 DIAGNOSIS — M797 Fibromyalgia: Secondary | ICD-10-CM | POA: Diagnosis not present

## 2023-05-21 DIAGNOSIS — M47816 Spondylosis without myelopathy or radiculopathy, lumbar region: Secondary | ICD-10-CM | POA: Diagnosis not present

## 2023-05-22 DIAGNOSIS — M47816 Spondylosis without myelopathy or radiculopathy, lumbar region: Secondary | ICD-10-CM | POA: Diagnosis not present

## 2023-05-22 DIAGNOSIS — M2550 Pain in unspecified joint: Secondary | ICD-10-CM | POA: Diagnosis not present

## 2023-05-22 DIAGNOSIS — M797 Fibromyalgia: Secondary | ICD-10-CM | POA: Diagnosis not present

## 2023-05-22 DIAGNOSIS — M069 Rheumatoid arthritis, unspecified: Secondary | ICD-10-CM | POA: Diagnosis not present

## 2023-05-31 ENCOUNTER — Ambulatory Visit: Payer: Medicare Other | Admitting: Physician Assistant

## 2023-06-14 ENCOUNTER — Ambulatory Visit: Payer: Medicare Other | Admitting: Physician Assistant

## 2023-06-19 DIAGNOSIS — I10 Essential (primary) hypertension: Secondary | ICD-10-CM | POA: Diagnosis not present

## 2023-06-19 DIAGNOSIS — G2581 Restless legs syndrome: Secondary | ICD-10-CM | POA: Diagnosis not present

## 2023-06-19 DIAGNOSIS — R7303 Prediabetes: Secondary | ICD-10-CM | POA: Diagnosis not present

## 2023-06-19 DIAGNOSIS — G43009 Migraine without aura, not intractable, without status migrainosus: Secondary | ICD-10-CM | POA: Diagnosis not present

## 2023-06-28 ENCOUNTER — Encounter: Payer: Self-pay | Admitting: Physician Assistant

## 2023-06-28 ENCOUNTER — Ambulatory Visit: Attending: Physician Assistant | Admitting: Physician Assistant

## 2023-06-28 VITALS — BP 130/74 | HR 84 | Resp 17 | Ht 65.0 in | Wt 232.2 lb

## 2023-06-28 DIAGNOSIS — M19041 Primary osteoarthritis, right hand: Secondary | ICD-10-CM

## 2023-06-28 DIAGNOSIS — M0600 Rheumatoid arthritis without rheumatoid factor, unspecified site: Secondary | ICD-10-CM

## 2023-06-28 DIAGNOSIS — M7062 Trochanteric bursitis, left hip: Secondary | ICD-10-CM

## 2023-06-28 DIAGNOSIS — R5383 Other fatigue: Secondary | ICD-10-CM | POA: Diagnosis not present

## 2023-06-28 DIAGNOSIS — Z96651 Presence of right artificial knee joint: Secondary | ICD-10-CM

## 2023-06-28 DIAGNOSIS — M1712 Unilateral primary osteoarthritis, left knee: Secondary | ICD-10-CM | POA: Diagnosis not present

## 2023-06-28 DIAGNOSIS — M19072 Primary osteoarthritis, left ankle and foot: Secondary | ICD-10-CM

## 2023-06-28 DIAGNOSIS — Z79899 Other long term (current) drug therapy: Secondary | ICD-10-CM | POA: Diagnosis not present

## 2023-06-28 DIAGNOSIS — M62838 Other muscle spasm: Secondary | ICD-10-CM | POA: Diagnosis not present

## 2023-06-28 DIAGNOSIS — M7061 Trochanteric bursitis, right hip: Secondary | ICD-10-CM

## 2023-06-28 DIAGNOSIS — G4709 Other insomnia: Secondary | ICD-10-CM

## 2023-06-28 DIAGNOSIS — M5030 Other cervical disc degeneration, unspecified cervical region: Secondary | ICD-10-CM

## 2023-06-28 DIAGNOSIS — M19071 Primary osteoarthritis, right ankle and foot: Secondary | ICD-10-CM | POA: Diagnosis not present

## 2023-06-28 DIAGNOSIS — E559 Vitamin D deficiency, unspecified: Secondary | ICD-10-CM

## 2023-06-28 DIAGNOSIS — M19042 Primary osteoarthritis, left hand: Secondary | ICD-10-CM

## 2023-06-28 DIAGNOSIS — M797 Fibromyalgia: Secondary | ICD-10-CM | POA: Diagnosis not present

## 2023-06-28 DIAGNOSIS — F331 Major depressive disorder, recurrent, moderate: Secondary | ICD-10-CM

## 2023-06-28 LAB — CBC WITH DIFFERENTIAL/PLATELET
Absolute Lymphocytes: 2409 {cells}/uL (ref 850–3900)
Absolute Monocytes: 587 {cells}/uL (ref 200–950)
Basophils Absolute: 53 {cells}/uL (ref 0–200)
Basophils Relative: 0.8 %
Eosinophils Absolute: 99 {cells}/uL (ref 15–500)
Eosinophils Relative: 1.5 %
HCT: 37.9 % (ref 35.0–45.0)
Hemoglobin: 11.6 g/dL — ABNORMAL LOW (ref 11.7–15.5)
MCH: 23.8 pg — ABNORMAL LOW (ref 27.0–33.0)
MCHC: 30.6 g/dL — ABNORMAL LOW (ref 32.0–36.0)
MCV: 77.7 fL — ABNORMAL LOW (ref 80.0–100.0)
MPV: 10.9 fL (ref 7.5–12.5)
Monocytes Relative: 8.9 %
Neutro Abs: 3452 {cells}/uL (ref 1500–7800)
Neutrophils Relative %: 52.3 %
Platelets: 311 10*3/uL (ref 140–400)
RBC: 4.88 10*6/uL (ref 3.80–5.10)
RDW: 15.4 % — ABNORMAL HIGH (ref 11.0–15.0)
Total Lymphocyte: 36.5 %
WBC: 6.6 10*3/uL (ref 3.8–10.8)

## 2023-06-28 MED ORDER — TRIAMCINOLONE ACETONIDE 40 MG/ML IJ SUSP
10.0000 mg | INTRAMUSCULAR | Status: AC | PRN
  Administered 2023-06-28: 10 mg via INTRAMUSCULAR

## 2023-06-28 MED ORDER — LIDOCAINE HCL 1 % IJ SOLN
0.5000 mL | INTRAMUSCULAR | Status: AC | PRN
  Administered 2023-06-28: .5 mL

## 2023-06-28 MED ORDER — PREDNISONE 5 MG PO TABS: ORAL_TABLET | ORAL | 0 refills | Status: AC

## 2023-06-28 MED ORDER — LEFLUNOMIDE 20 MG PO TABS: 20.0000 mg | ORAL_TABLET | Freq: Every day | ORAL | 0 refills | Status: DC

## 2023-06-28 NOTE — Progress Notes (Signed)
 Office Visit Note  Patient: Amanda Hammond             Date of Birth: 01/24/1971           MRN: [id number]             PCP: Minus Breeding, PA-C Referring: Minus Breeding, [place] Visit Date: 06/28/2023 Occupation: @GUAROCC @  Subjective:  Trapezius trigger point injections   History of Present Illness: Amanda Hammond is a 53 y.o. female with history of rheumatoid arthritis.  Patient is currently taking Arava 20 mg 1 tablet by mouth daily and Plaquenil 200 mg 1 tablet by mouth twice daily.  Patient has been out of her prescription for Orencia for the past 1 month due to needing to renew patient assistance for 2025.  Patient states she has been experiencing increased pain and intermittent inflammation in her hands while off of Orencia.  Patient states her right thumb has also been locking up.  Patient states she has been experiencing frequent fibromyalgia flares which she attributes to increased rest and weather changes.  She is having trapezius muscle tension and tenderness bilaterally.  Patient had a good response to trigger point injections performed at her last office visit on 02/27/2023.  She requested repeat trigger point injections today.  Patient remains on oxycodone, Lyrica, Cymbalta, and Zanaflex as prescribed.     Activities of Daily Living:  Patient reports morning stiffness for several hours.   Patient Reports nocturnal pain.  Difficulty dressing/grooming: Reports Difficulty climbing stairs: Reports Difficulty getting out of chair: Reports Difficulty using hands for taps, buttons, cutlery, and/or writing: Reports  Review of Systems  Constitutional:  Positive for fatigue.  HENT:  Positive for mouth dryness and nose dryness. Negative for mouth sores.   Eyes:  Positive for pain and dryness.  Respiratory:  Positive for shortness of breath and difficulty breathing.   Cardiovascular:  Positive for chest pain and palpitations.  Gastrointestinal:  Positive for  constipation and diarrhea. Negative for blood in stool.  Endocrine: Negative for increased urination.  Genitourinary:  Negative for involuntary urination.  Musculoskeletal:  Positive for joint pain, gait problem, joint pain, joint swelling, myalgias, muscle weakness, morning stiffness, muscle tenderness and myalgias.  Skin:  Positive for color change. Negative for rash, hair loss and sensitivity to sunlight.  Allergic/Immunologic: Positive for susceptible to infections.  Neurological:  Positive for dizziness and headaches.  Hematological:  Negative for swollen glands.  Psychiatric/Behavioral:  Positive for depressed mood and sleep disturbance. The patient is nervous/anxious.     PMFS History:  Patient Active Problem List   Diagnosis Date Noted   Rheumatoid factor positive 11/06/2016   Trochanteric bursitis of both hips 11/06/2016   History of fibromyalgia 11/06/2016   DDD (degenerative disc disease), cervical 11/06/2016   Carpal tunnel syndrome, left upper limb 11/06/2016   Carpal tunnel syndrome, right upper limb 11/06/2016   Trigger finger, left middle finger 11/06/2016   Major depressive disorder, recurrent episode, moderate (HCC) 04/27/2016   Pain in left hand 04/25/2016   Pain in right hand 04/25/2016   Primary osteoarthritis of both hands 04/25/2016   Primary osteoarthritis of both knees 04/25/2016   Primary osteoarthritis of both feet 04/25/2016   Fibromyalgia 02/17/2016   Other insomnia 02/17/2016   Fatigue 02/17/2016   B12 DEFICIENCY 02/01/2010   Vitamin D deficiency 02/01/2010   ANEMIA, IRON DEFICIENCY 12/30/2009   OTHER DYSPNEA AND RESPIRATORY ABNORMALITIES 12/30/2009   Nonspecific (abnormal) findings on radiological and other examination  of body structure 12/30/2009   COMPUTERIZED TOMOGRAPHY, CHEST, ABNORMAL 12/30/2009    Past Medical History:  Diagnosis Date   Anemia    Anxiety    Depression    Fibromyalgia    Hypertension    Osteoarthritis    Pneumonia     PONV (postoperative nausea and vomiting)    Rheumatoid arthritis (HCC)    Vitamin D deficiency     Family History  Problem Relation Age of Onset   Diabetes Mother    Hypertension Mother    Hypertension Father    Hypertension Brother    Osteoarthritis Brother    Hypertension Brother    Osteoarthritis Brother    Endometriosis Daughter    Anesthesia problems Neg Hx    Hypotension Neg Hx    Pseudochol deficiency Neg Hx    Malignant hyperthermia Neg Hx    Past Surgical History:  Procedure Laterality Date   APPENDECTOMY  2010   [facility]   CARPAL TUNNEL RELEASE Left    CARPAL TUNNEL RELEASE Right 01/22/2023   CHOLECYSTECTOMY  1999   lap, [facility]   GASTRIC BYPASS  2003   [facility]   HERNIA REPAIR  08/2010, 02/2010    incisional, [facility], [facility]   INCISIONAL HERNIA REPAIR  10/12/2010   Procedure: HERNIA REPAIR INCISIONAL;  Surgeon: Dalia Heading;  Location: [facility];  Service: General;  Laterality: N/A;  Recurrent Incisional Hernia Repair with Mesh   KNEE SURGERY Right 01/2018   meniscal tear    KNEE SURGERY Right 12/2019   PANNICULECTOMY  2008   [facility]   SHOULDER SURGERY Left    TENNIS ELBOW RELEASE/NIRSCHEL PROCEDURE Bilateral    TOTAL KNEE ARTHROPLASTY Right 03/31/2022   Procedure: TOTAL KNEE ARTHROPLASTY;  Surgeon: Frederico Hamman, MD;  Location: [facility];  Service: Orthopedics;  Laterality: Right;   Social History   Social History Narrative   Not on file    There is no immunization history on file for this patient.   Objective: Vital Signs: BP 130/74 (BP Location: Left Arm, Patient Position: Sitting, Cuff Size: Large)   Pulse 84   Resp 17   Ht 5\' 5"  (1.651 m)   Wt 232 lb 3.2 oz (105.3 kg)   BMI 38.64 kg/m    Physical Exam Vitals and nursing note reviewed.  Constitutional:      Appearance: She is well-developed.  HENT:     Head: Normocephalic and atraumatic.  Eyes:     Conjunctiva/sclera: Conjunctivae normal.  Cardiovascular:     Rate and Rhythm: Normal rate and regular  rhythm.     Heart sounds: Normal heart sounds.  Pulmonary:     Effort: Pulmonary effort is normal.     Breath sounds: Normal breath sounds.  Abdominal:     General: Bowel sounds are normal.     Palpations: Abdomen is soft.  Musculoskeletal:     Cervical back: Normal range of motion.  Lymphadenopathy:     Cervical: No cervical adenopathy.  Skin:    General: Skin is warm and dry.     Capillary Refill: Capillary refill takes less than 2 seconds.  Neurological:     Mental Status: She is alert and oriented to person, place, and time.  Psychiatric:        Behavior: Behavior normal.      Musculoskeletal Exam: Generalized hyperalgesia and positive tender points on exam.  Trapezius muscle tension and tenderness bilaterally.  C-spine has painful limited range of motion.  Shoulder joints have good range of  motion with some discomfort bilaterally.  Elbow joints and wrist joints have good range of motion.  Tenderness and inflammation noted in the right second MCP joint.  Synovial thickening of the left second MCP joint.  PIP and DIP thickening noted.  Tenderness of DIP joints noted.  Right trigger thumb noted.  Right knee replacement has good range of motion with no effusion.  Left knee joint has good range of motion with no warmth or effusion.  Ankle joints have good range of motion with no tenderness or joint swelling.  CDAI Exam: CDAI Score: -- Patient Global: --; Provider Global: -- Swollen: --; Tender: -- Joint Exam 06/28/2023   No joint exam has been documented for this visit   There is currently no information documented on the homunculus. Go to the Rheumatology activity and complete the homunculus joint exam.  Investigation: No additional findings.  Imaging: No results found.  Recent Labs: Lab Results  Component Value Date   WBC 6.6 02/27/2023   HGB 10.3 (L) 02/27/2023   PLT 291 02/27/2023   NA 137 02/27/2023   K 4.2 02/27/2023   CL 102 02/27/2023   CO2 31 02/27/2023    GLUCOSE 107 (H) 02/27/2023   BUN 12 02/27/2023   CREATININE 0.79 02/27/2023   BILITOT 0.4 02/27/2023   ALKPHOS 87 03/22/2022   AST 14 02/27/2023   ALT 14 02/27/2023   PROT 6.2 02/27/2023   ALBUMIN 3.4 (L) 03/22/2022   CALCIUM 8.7 02/27/2023   GFRAA 67 09/02/2020   QFTBGOLDPLUS NEGATIVE 02/27/2023    Speciality Comments: Orencia started 08/11/21  PLQ eye exam scheduled for 07/16/2023 per patient  Procedures:  Trigger Point Inj  Date/Time: 06/28/2023 1:50 PM  Performed by: Gearldine Bienenstock, PA-C Authorized by: Gearldine Bienenstock, PA-C   Consent Given by:  Patient Site marked: the procedure site was marked   Timeout: prior to procedure the correct patient, procedure, and site was verified   Indications:  Pain Total # of Trigger Points:  2 Location: neck   Needle Size:  27 G Approach:  Dorsal Medications #1:  0.5 mL lidocaine 1 %; 10 mg triamcinolone acetonide 40 MG/ML Medications #2:  0.5 mL lidocaine 1 %; 10 mg triamcinolone acetonide 40 MG/ML Patient tolerance:  Patient tolerated the procedure well with no immediate complications  Allergies: Patient has no known allergies.    Assessment / Plan:     Visit Diagnoses: Seronegative rheumatoid arthritis (HCC): Patient has been experiencing increased pain and intermittent inflammation involving both hands which she attributes to being off of Orencia for the past 1 month.  Patient completed the patient assistance application today while in the office.  She has remained on Arava 20 mg 1 tablet daily and Plaquenil 200 mg 1 tablet by mouth twice daily.  She is tolerating combination therapy without any side effects.  Patient requested a prednisone taper today to help alleviate her current symptoms until she is able to resume Orencia as prescribed.  A prednisone taper starting at 20 mg tapering by 5 mg every 4 days was sent to the pharmacy today.  Instructions were provided.  She was advised to notify us if her symptoms persist or worsen.  A  refill of Arava was sent to the pharmacy today.  She will follow-up in the office in 3 months or sooner if needed.  High risk medication use - Orencia 125 mg subcutaneous injections once weekly, Arava 20 mg daily, and Plaquenil 200 mg 1 tablet by mouth twice daily.  CMP and lipid panel updated 06/19/23.   CBC updated today.  She will continue to require updated lab work every 3 months. TB gold negative on 02/27/2023. No recent or recurrent infections.  Discussed the importance of holding Arava and Orencia if she develops signs or symptoms of an infection and to resume once the infection is completely cleared. Scheduled to update a Plaquenil examination on 07/16/2023. - Plan: CBC with Differential/Platelet  Primary osteoarthritis of both hands: She has PIP and DIP thickening consistent with osteoarthritis of both hands.  She has been experiencing increased tenderness in the DIP joints.  She has been wearing gloves at home which have been helpful.  Discussed importance of joint protection and muscle strengthening.  Trochanteric bursitis of both hips: Intermittent discomfort.  Primary osteoarthritis of left knee: No warmth or effusion.   S/P total knee arthroplasty, right - Dr. Christian Mate 03/31/22: Doing well.  No effusion noted.   Primary osteoarthritis of both feet: She is not experiencing any increased pain in her feet.   Fibromyalgia: Generalized hyperalgesia and positive tender points on exam.  She takes oxycodone for pain relief and remains on tizanidine, trazodone, Cymbalta, and Lyrica as prescribed.  DDD (degenerative disc disease), cervical: C-spine has limited range of motion.  Trapezius muscle spasm - She presents today with trapezius muscle tension and tenderness bilaterally.  She had trigger point injections performed on 02/27/2023 which provided significant relief but her symptoms have returned.  She has been taking tizanidine as needed for muscle spasms but requested repeat trigger  point injections today.  She tolerated the procedures well.  Procedure notes were completed above.  Aftercare was discussed.  Plan: Trigger Point Inj  Other insomnia: She experiences interrupted sleep at night which contributes to nocturnal pain.  Other fatigue: Chronic, stable.   Vitamin D deficiency  Major depressive disorder, recurrent episode, moderate (HCC)  Orders: Orders Placed This Encounter  Procedures   Trigger Point Inj   CBC with Differential/Platelet   Meds ordered this encounter  Medications   leflunomide (ARAVA) 20 MG tablet    Sig: Take 1 tablet (20 mg total) by mouth daily.    Dispense:  90 tablet    Refill:  0   predniSONE (DELTASONE) 5 MG tablet    Sig: Take 4 tabs po x 4 days, 3  tabs po x 4 days, 2  tabs po x 4 days, 1  tab po x 4 days    Dispense:  40 tablet    Refill:  0      Follow-Up Instructions: Return in about 3 months (around 09/28/2023) for Rheumatoid arthritis, Fibromyalgia.   Gearldine Bienenstock, PA-C  Note - This record has been created using Dragon software.  Chart creation errors have been sought, but may not always  have been located. Such creation errors do not reflect on  the standard of medical care.

## 2023-06-29 ENCOUNTER — Other Ambulatory Visit (HOSPITAL_COMMUNITY): Payer: Self-pay

## 2023-06-29 NOTE — Progress Notes (Signed)
 Hemoglobin remains low but has improved.   Rest of CBC stable.

## 2023-06-29 NOTE — Telephone Encounter (Signed)
 Received notification from [facility] regarding a prior authorization for ORENCIA SQ. Authorization has been APPROVED from 06/29/23 to 04/02/24. Approval letter sent to scan center.  Per test claim, copay for 28 days supply is $1558.56  Authorization # [id number]  Chesley Mires, PharmD, MPH, BCPS, CPP Clinical Pharmacist (Rheumatology and Pulmonology)

## 2023-06-29 NOTE — Telephone Encounter (Signed)
 Submitted a Prior Authorization request to [facility] for ORENCIA SQ via CoverMyMeds. Will update once we receive a response.  Key: [id number]

## 2023-07-03 NOTE — Telephone Encounter (Signed)
 Submitted Patient Assistance RENEWAL Application to [employer] for ORENCIA SQ along with provider portion, patient portion, PA, medication list, insurance card copy. Will update patient when we receive a response.  Phone #: [phone] Fax #: [phone]   Chesley Mires, PharmD, MPH, BCPS, CPP Clinical Pharmacist (Rheumatology and Pulmonology)

## 2023-08-07 NOTE — Telephone Encounter (Signed)
 Received fax from [employer] for ORENCIA  SQ patient assistance, patient's application has been DENIED due to not meeting financial criteria: - must submit documentation that 3% of OOP rx expenses spent based on household income.  - must apply and be denied for LIS  ATC pt to discuss if interested in utilizing Medicare Prescription Payment Plan. Left VM   Geraldene Kleine, PharmD, MPH, BCPS, CPP Clinical Pharmacist (Rheumatology and Pulmonology) .

## 2023-08-09 NOTE — Telephone Encounter (Signed)
 Attempted to call patient to discuss the requirements to move forward with her ORENCIA  assistance application. Unable to reach her. LVM.    Tolu Zyana Amaro, PharmD [facility] PGY-1

## 2023-08-10 NOTE — Telephone Encounter (Signed)
 Enrolled patient into RA grant through [employer]: ID: [id number] Eligibility start date: 05/11/2023 Group ID: [id number] Eligibility end date: 08/07/2024 RxBin ID: [id number] Assistance amount: $2500 PCN: [id number]

## 2023-08-13 NOTE — Telephone Encounter (Signed)
 ATC patient to inform her of [employer] approval. Unable to reach her. LVM with clinic call back number.   Amanda Hammond, PharmD [facility] PGY-1

## 2023-08-16 NOTE — Telephone Encounter (Signed)
 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 She was also informed that is overdue for [employer]. She was provided with the new walk-in lab hours at the clinic. She agreed to return next week for lab-work.   Once labs are repeated and reviewed we will send Orencia  prescription to [facility].   Tolu Andruw Battie, PharmD [facility] PGY-1

## 2023-08-17 DIAGNOSIS — M069 Rheumatoid arthritis, unspecified: Secondary | ICD-10-CM | POA: Diagnosis not present

## 2023-08-17 DIAGNOSIS — M47816 Spondylosis without myelopathy or radiculopathy, lumbar region: Secondary | ICD-10-CM | POA: Diagnosis not present

## 2023-08-17 DIAGNOSIS — M25521 Pain in right elbow: Secondary | ICD-10-CM | POA: Diagnosis not present

## 2023-08-17 DIAGNOSIS — M797 Fibromyalgia: Secondary | ICD-10-CM | POA: Diagnosis not present

## 2023-08-23 NOTE — Telephone Encounter (Signed)
 Left voicemail to remind patient to complete labwork for Orencia  new start.   Tolu Mardelle Pandolfi, PharmD [employer] PGY1

## 2023-08-29 ENCOUNTER — Telehealth: Payer: Self-pay | Admitting: Rheumatology

## 2023-08-29 NOTE — Telephone Encounter (Signed)
 Pt called wanting the pharmacist to know she will be having her labs drawn for the medication (orencia ) tomorrow 08/30/23 in our office.

## 2023-08-30 NOTE — Telephone Encounter (Signed)
 Noted. Will send rx after labs result

## 2023-09-07 MED ORDER — PREDNISONE 5 MG PO TABS: ORAL_TABLET | ORAL | 0 refills | Status: AC

## 2023-09-07 NOTE — Telephone Encounter (Signed)
 Patient will be coming to have labs this coming week.

## 2023-09-07 NOTE — Telephone Encounter (Signed)
 Amanda Hammond was going to send the prescription for Orencia  pending lab results. I do not see labs under lab tab. Has the patient had lab work obtained?   Ok to send in prednisone  taper starting at 20 mg tapering by 5 mg every 2 days.  Ok to schedule sooner visit if needed.

## 2023-09-07 NOTE — Telephone Encounter (Signed)
 Reached out to patient she states she is having pain and swelling in her hands. Patient states it is hard for her to even type. Patient states she is taking her Arava  daily. Patient states she has been off Orencia  for about 8 months due to being denied for patient assistance. Patient states she has been off PLQ for 3 months due to not having her eye exam yet. Patient state she has also started nothing pain and "changes in joints" in her feet. Patient does have a follow up visit schedule for 10/01/2023. Please advise.

## 2023-09-10 DIAGNOSIS — Z1329 Encounter for screening for other suspected endocrine disorder: Secondary | ICD-10-CM | POA: Diagnosis not present

## 2023-09-10 DIAGNOSIS — Z13220 Encounter for screening for lipoid disorders: Secondary | ICD-10-CM | POA: Diagnosis not present

## 2023-09-10 DIAGNOSIS — I10 Essential (primary) hypertension: Secondary | ICD-10-CM | POA: Diagnosis not present

## 2023-09-10 DIAGNOSIS — R7303 Prediabetes: Secondary | ICD-10-CM | POA: Diagnosis not present

## 2023-09-11 ENCOUNTER — Other Ambulatory Visit: Payer: Self-pay | Admitting: *Deleted

## 2023-09-11 DIAGNOSIS — Z79899 Other long term (current) drug therapy: Secondary | ICD-10-CM | POA: Diagnosis not present

## 2023-09-11 DIAGNOSIS — G894 Chronic pain syndrome: Secondary | ICD-10-CM | POA: Diagnosis not present

## 2023-09-11 DIAGNOSIS — I10 Essential (primary) hypertension: Secondary | ICD-10-CM | POA: Diagnosis not present

## 2023-09-11 DIAGNOSIS — R7303 Prediabetes: Secondary | ICD-10-CM | POA: Diagnosis not present

## 2023-09-11 DIAGNOSIS — M25521 Pain in right elbow: Secondary | ICD-10-CM | POA: Diagnosis not present

## 2023-09-11 LAB — CBC WITH DIFFERENTIAL/PLATELET
Absolute Lymphocytes: 2960 {cells}/uL (ref 850–3900)
Absolute Monocytes: 800 {cells}/uL (ref 200–950)
Basophils Absolute: 40 {cells}/uL (ref 0–200)
Basophils Relative: 0.4 %
Eosinophils Absolute: 40 {cells}/uL (ref 15–500)
Eosinophils Relative: 0.4 %
HCT: 36.1 % (ref 35.0–45.0)
Hemoglobin: 11.1 g/dL — ABNORMAL LOW (ref 11.7–15.5)
MCH: 24.2 pg — ABNORMAL LOW (ref 27.0–33.0)
MCHC: 30.7 g/dL — ABNORMAL LOW (ref 32.0–36.0)
MCV: 78.6 fL — ABNORMAL LOW (ref 80.0–100.0)
MPV: 10.3 fL (ref 7.5–12.5)
Monocytes Relative: 8 %
Neutro Abs: 6160 {cells}/uL (ref 1500–7800)
Neutrophils Relative %: 61.6 %
Platelets: 355 10*3/uL (ref 140–400)
RBC: 4.59 10*6/uL (ref 3.80–5.10)
RDW: 15.7 % — ABNORMAL HIGH (ref 11.0–15.0)
Total Lymphocyte: 29.6 %
WBC: 10 10*3/uL (ref 3.8–10.8)

## 2023-09-11 LAB — COMPREHENSIVE METABOLIC PANEL WITH GFR
AG Ratio: 2.2 (calc) (ref 1.0–2.5)
ALT: 14 U/L (ref 6–29)
AST: 12 U/L (ref 10–35)
Albumin: 4.3 g/dL (ref 3.6–5.1)
Alkaline phosphatase (APISO): 98 U/L (ref 37–153)
BUN: 18 mg/dL (ref 7–25)
CO2: 30 mmol/L (ref 20–32)
Calcium: 8.9 mg/dL (ref 8.6–10.4)
Chloride: 96 mmol/L — ABNORMAL LOW (ref 98–110)
Creat: 0.79 mg/dL (ref 0.50–1.03)
Globulin: 2 g/dL (ref 1.9–3.7)
Glucose, Bld: 99 mg/dL (ref 65–99)
Potassium: 4.3 mmol/L (ref 3.5–5.3)
Sodium: 134 mmol/L — ABNORMAL LOW (ref 135–146)
Total Bilirubin: 0.3 mg/dL (ref 0.2–1.2)
Total Protein: 6.3 g/dL (ref 6.1–8.1)
eGFR: 90 mL/min/{1.73_m2} (ref >=60)

## 2023-09-11 NOTE — Telephone Encounter (Signed)
 Closing follow-up. Once patient has labs completed, rx for Orencia  SQ can be sent to [facility] as she is enrolled into a grant for RA now  Geraldene Kleine, PharmD, MPH, BCPS, CPP Clinical Pharmacist (Rheumatology and Pulmonology)

## 2023-09-12 ENCOUNTER — Other Ambulatory Visit (HOSPITAL_COMMUNITY): Payer: Self-pay

## 2023-09-12 ENCOUNTER — Other Ambulatory Visit: Payer: Self-pay | Admitting: Pharmacist

## 2023-09-12 ENCOUNTER — Ambulatory Visit: Payer: Self-pay | Admitting: Rheumatology

## 2023-09-12 ENCOUNTER — Other Ambulatory Visit: Payer: Self-pay

## 2023-09-12 ENCOUNTER — Other Ambulatory Visit: Payer: Self-pay | Admitting: *Deleted

## 2023-09-12 DIAGNOSIS — M0600 Rheumatoid arthritis without rheumatoid factor, unspecified site: Secondary | ICD-10-CM

## 2023-09-12 DIAGNOSIS — Z79899 Other long term (current) drug therapy: Secondary | ICD-10-CM

## 2023-09-12 MED ORDER — LEFLUNOMIDE 20 MG PO TABS: 20.0000 mg | ORAL_TABLET | Freq: Every day | ORAL | 0 refills | Status: DC

## 2023-09-12 MED ORDER — HYDROXYCHLOROQUINE SULFATE 200 MG PO TABS
200.0000 mg | ORAL_TABLET | Freq: Two times a day (BID) | ORAL | 0 refills | Status: AC
Start: 2023-09-12 — End: ?

## 2023-09-12 MED ORDER — ORENCIA CLICKJECT 125 MG/ML ~~LOC~~ SOAJ
125.0000 mg | SUBCUTANEOUS | 0 refills | Status: DC
Start: 2023-09-12 — End: 2023-11-28
  Filled 2023-09-12: qty 4, 28d supply, fill #0
  Filled 2023-10-08: qty 4, 28d supply, fill #1
  Filled 2023-10-30: qty 4, 28d supply, fill #2

## 2023-09-12 NOTE — Addendum Note (Signed)
 Addended by: Thais Fill on: 09/12/2023 09:34 AM   Modules accepted: Orders

## 2023-09-12 NOTE — Progress Notes (Signed)
 Specialty Pharmacy Initial Fill Coordination Note  Amanda Hammond is a 53 y.o. female contacted today regarding initial fill of specialty medication(s) Abatacept  (Orencia  [employer]ect)   Patient requested Delivery   Delivery date: 09/14/23   Verified address: [address]   [place] [address]   Medication will be filled on 09/13/23.   Patient is enrolled into grant and is aware of $0 copayment.

## 2023-09-12 NOTE — Telephone Encounter (Signed)
 Refills for leflunomide  and hydroxychloroquine  also sent to [facility]  Geraldene Kleine, PharmD, MPH, BCPS, CPP Clinical Pharmacist (Rheumatology and Pulmonology)

## 2023-09-12 NOTE — Telephone Encounter (Signed)
 Last Fill: 03/19/2023  Labs: 09/11/2023 Hemoglobin is low and stable.  CMP is normal.   TB Gold: 02/27/2023 Neg    Next Visit: 10/01/2023  Last Visit: 06/28/2023  DX: Seronegative rheumatoid arthritis    Current Dose per office note 06/28/2023: Orencia  125 mg subcutaneous injections once weekly   Okay to refill Orencia ?

## 2023-09-12 NOTE — Progress Notes (Signed)
Hemoglobin is low and stable.  CMP is normal.

## 2023-09-12 NOTE — Progress Notes (Signed)
 Patient on Orencia  125mg  subcut every 7 days and leflunomide  20mg  daily for RA. Discontinued due to cost and follow-up with labs  She will restart Orencia   Enrolled patient into RA grant through [employer]: ID: [id number] Eligibility start date: 05/11/2023 Group ID: [id number] Eligibility end date: 08/07/2024 RxBin ID: [id number] Assistance amount: $2500 PCN: [id number]  Geraldene Kleine, PharmD, MPH, BCPS, CPP Clinical Pharmacist (Rheumatology and Pulmonology)

## 2023-09-13 ENCOUNTER — Other Ambulatory Visit: Payer: Self-pay

## 2023-09-14 ENCOUNTER — Other Ambulatory Visit: Payer: Self-pay

## 2023-10-01 ENCOUNTER — Ambulatory Visit: Admitting: Physician Assistant

## 2023-10-01 NOTE — Progress Notes (Deleted)
 Office Visit Note  Patient: Amanda Hammond             Date of Birth: [birth year]           MRN: [id number]             PCP: Blanca Reynolds ORN, PA-C Referring: Blanca Reynolds ORN, [place] Visit Date: 10/01/2023 Occupation: @GUAROCC @  Subjective:    History of Present Illness: Amanda Hammond is a 53 y.o. female   CBC and CMP were drawn on 09/11/2023. TB Gold negative 11/126/24.   Discussed the importance of holding Orencia  if she develops signs or symptoms of an infection and to resume once the infection is completely cleared. PLQ eye exam scheduled for 07/16/2023 per patient   Activities of Daily Living:  Patient reports morning stiffness for *** {minute/hour:19697}.   Patient {ACTIONS;DENIES/REPORTS:21021675::Denies} nocturnal pain.  Difficulty dressing/grooming: {ACTIONS;DENIES/REPORTS:21021675::Denies} Difficulty climbing stairs: {ACTIONS;DENIES/REPORTS:21021675::Denies} Difficulty getting out of chair: {ACTIONS;DENIES/REPORTS:21021675::Denies} Difficulty using hands for taps, buttons, cutlery, and/or writing: {ACTIONS;DENIES/REPORTS:21021675::Denies}  No Rheumatology ROS completed.   PMFS History:  Patient Active Problem List   Diagnosis Date Noted   Rheumatoid factor positive 11/06/2016   Trochanteric bursitis of both hips 11/06/2016   History of fibromyalgia 11/06/2016   DDD (degenerative disc disease), cervical 11/06/2016   Carpal tunnel syndrome, left upper limb 11/06/2016   Carpal tunnel syndrome, right upper limb 11/06/2016   Trigger finger, left middle finger 11/06/2016   Major depressive disorder, recurrent episode, moderate (HCC) 04/27/2016   Pain in left hand 04/25/2016   Pain in right hand 04/25/2016   Primary osteoarthritis of both hands 04/25/2016   Primary osteoarthritis of both knees 04/25/2016   Primary osteoarthritis of both feet 04/25/2016   Fibromyalgia 02/17/2016   Other insomnia 02/17/2016   Fatigue 02/17/2016   B12 DEFICIENCY  02/01/2010   Vitamin D deficiency 02/01/2010   ANEMIA, IRON DEFICIENCY 12/30/2009   OTHER DYSPNEA AND RESPIRATORY ABNORMALITIES 12/30/2009   Nonspecific (abnormal) findings on radiological and other examination of body structure 12/30/2009   COMPUTERIZED TOMOGRAPHY, CHEST, ABNORMAL 12/30/2009    Past Medical History:  Diagnosis Date   Anemia    Anxiety    Depression    Fibromyalgia    Hypertension    Osteoarthritis    Pneumonia    PONV (postoperative nausea and vomiting)    Rheumatoid arthritis (HCC)    Vitamin D deficiency     Family History  Problem Relation Age of Onset   Diabetes Mother    Hypertension Mother    Hypertension Father    Hypertension Brother    Osteoarthritis Brother    Hypertension Brother    Osteoarthritis Brother    Endometriosis Daughter    Anesthesia problems Neg Hx    Hypotension Neg Hx    Pseudochol deficiency Neg Hx    Malignant hyperthermia Neg Hx    Past Surgical History:  Procedure Laterality Date   APPENDECTOMY  2010   [facility]   CARPAL TUNNEL RELEASE Left    CARPAL TUNNEL RELEASE Right 01/22/2023   CHOLECYSTECTOMY  1999   lap, [facility]   GASTRIC BYPASS  2003   [facility]   HERNIA REPAIR  08/2010, 02/2010    incisional, [facility], [facility]   INCISIONAL HERNIA REPAIR  10/12/2010   Procedure: HERNIA REPAIR INCISIONAL;  Surgeon: Oneil DELENA Budge;  Location: [facility];  Service: General;  Laterality: N/A;  Recurrent Incisional Hernia Repair with Mesh   KNEE SURGERY Right 01/2018   meniscal tear    KNEE SURGERY  Right 12/2019   PANNICULECTOMY  2008   [facility]   SHOULDER SURGERY Left    TENNIS ELBOW RELEASE/NIRSCHEL PROCEDURE Bilateral    TOTAL KNEE ARTHROPLASTY Right 03/31/2022   Procedure: TOTAL KNEE ARTHROPLASTY;  Surgeon: Shari Sieving, MD;  Location: [facility];  Service: Orthopedics;  Laterality: Right;   Social History   Social History Narrative   Not on file    There is no immunization history on file for this patient.   Objective: Vital Signs: There  were no vitals taken for this visit.   Physical Exam Vitals and nursing note reviewed.  Constitutional:      Appearance: She is well-developed.  HENT:     Head: Normocephalic and atraumatic.   Eyes:     Conjunctiva/sclera: Conjunctivae normal.    Cardiovascular:     Rate and Rhythm: Normal rate and regular rhythm.     Heart sounds: Normal heart sounds.  Pulmonary:     Effort: Pulmonary effort is normal.     Breath sounds: Normal breath sounds.  Abdominal:     General: Bowel sounds are normal.     Palpations: Abdomen is soft.   Musculoskeletal:     Cervical back: Normal range of motion.  Lymphadenopathy:     Cervical: No cervical adenopathy.   Skin:    General: Skin is warm and dry.     Capillary Refill: Capillary refill takes less than 2 seconds.   Neurological:     Mental Status: She is alert and oriented to person, place, and time.   Psychiatric:        Behavior: Behavior normal.      Musculoskeletal Exam: ***  CDAI Exam: CDAI Score: -- Patient Global: --; Provider Global: -- Swollen: --; Tender: -- Joint Exam 10/01/2023   No joint exam has been documented for this visit   There is currently no information documented on the homunculus. Go to the Rheumatology activity and complete the homunculus joint exam.  Investigation: No additional findings.  Imaging: No results found.  Recent Labs: Lab Results  Component Value Date   WBC 10.0 09/11/2023   HGB 11.1 (L) 09/11/2023   PLT 355 09/11/2023   NA 134 (L) 09/11/2023   K 4.3 09/11/2023   CL 96 (L) 09/11/2023   CO2 30 09/11/2023   GLUCOSE 99 09/11/2023   BUN 18 09/11/2023   CREATININE 0.79 09/11/2023   BILITOT 0.3 09/11/2023   ALKPHOS 87 03/22/2022   AST 12 09/11/2023   ALT 14 09/11/2023   PROT 6.3 09/11/2023   ALBUMIN 3.4 (L) 03/22/2022   CALCIUM 8.9 09/11/2023   GFRAA 67 09/02/2020   QFTBGOLDPLUS NEGATIVE 02/27/2023    Speciality Comments: Orencia  started 08/11/21  PLQ eye exam scheduled  for 07/16/2023 per patient  Procedures:  No procedures performed Allergies: Patient has no known allergies.   Assessment / Plan:     Visit Diagnoses: No diagnosis found.  Orders: No orders of the defined types were placed in this encounter.  No orders of the defined types were placed in this encounter.   Face-to-face time spent with patient was *** minutes. Greater than 50% of time was spent in counseling and coordination of care.  Follow-Up Instructions: No follow-ups on file.   Waddell CHRISTELLA Craze, PA-C  Note - This record has been created using Dragon software.  Chart creation errors have been sought, but may not always  have been located. Such creation errors do not reflect on  the standard of medical care.

## 2023-10-04 DIAGNOSIS — Z0001 Encounter for general adult medical examination with abnormal findings: Secondary | ICD-10-CM | POA: Diagnosis not present

## 2023-10-04 DIAGNOSIS — R7303 Prediabetes: Secondary | ICD-10-CM | POA: Diagnosis not present

## 2023-10-04 DIAGNOSIS — G2581 Restless legs syndrome: Secondary | ICD-10-CM | POA: Diagnosis not present

## 2023-10-04 DIAGNOSIS — G43009 Migraine without aura, not intractable, without status migrainosus: Secondary | ICD-10-CM | POA: Diagnosis not present

## 2023-10-08 ENCOUNTER — Other Ambulatory Visit: Payer: Self-pay

## 2023-10-08 ENCOUNTER — Encounter (INDEPENDENT_AMBULATORY_CARE_PROVIDER_SITE_OTHER): Payer: Self-pay | Admitting: *Deleted

## 2023-10-08 NOTE — Progress Notes (Signed)
 Specialty Pharmacy Refill Coordination Note  Amanda Hammond is a 53 y.o. female contacted today regarding refills of specialty medication(s) Abatacept  (Orencia  [employer]ect)   Patient requested Delivery   Delivery date: 10/10/23   Verified address: [address]   [place] [address]   Medication will be filled on 10/09/23.

## 2023-10-11 ENCOUNTER — Other Ambulatory Visit (HOSPITAL_COMMUNITY): Payer: Self-pay

## 2023-10-26 ENCOUNTER — Other Ambulatory Visit (HOSPITAL_COMMUNITY): Payer: Self-pay

## 2023-10-30 ENCOUNTER — Other Ambulatory Visit (HOSPITAL_COMMUNITY): Payer: Self-pay

## 2023-11-01 ENCOUNTER — Other Ambulatory Visit: Payer: Self-pay

## 2023-11-01 NOTE — Progress Notes (Signed)
 Specialty Pharmacy Refill Coordination Note  Amanda Hammond is a 53 y.o. female contacted today regarding refills of specialty medication(s) Abatacept  (Orencia  ClickJect)   Patient requested Delivery   Delivery date: 11/06/23   Verified address: [address]   [place] [address]   Medication will be filled on 08.04.25.

## 2023-11-12 ENCOUNTER — Ambulatory Visit: Admitting: Physician Assistant

## 2023-11-20 ENCOUNTER — Ambulatory Visit: Admitting: Physician Assistant

## 2023-11-26 ENCOUNTER — Other Ambulatory Visit: Payer: Self-pay

## 2023-11-27 ENCOUNTER — Ambulatory Visit: Attending: Physician Assistant | Admitting: Physician Assistant

## 2023-11-27 ENCOUNTER — Encounter: Payer: Self-pay | Admitting: Physician Assistant

## 2023-11-27 VITALS — BP 114/80 | HR 86 | Resp 14 | Ht 65.0 in | Wt 240.0 lb

## 2023-11-27 DIAGNOSIS — G4709 Other insomnia: Secondary | ICD-10-CM

## 2023-11-27 DIAGNOSIS — M62838 Other muscle spasm: Secondary | ICD-10-CM | POA: Diagnosis not present

## 2023-11-27 DIAGNOSIS — M19072 Primary osteoarthritis, left ankle and foot: Secondary | ICD-10-CM

## 2023-11-27 DIAGNOSIS — E559 Vitamin D deficiency, unspecified: Secondary | ICD-10-CM

## 2023-11-27 DIAGNOSIS — Z79899 Other long term (current) drug therapy: Secondary | ICD-10-CM

## 2023-11-27 DIAGNOSIS — M1712 Unilateral primary osteoarthritis, left knee: Secondary | ICD-10-CM

## 2023-11-27 DIAGNOSIS — M5030 Other cervical disc degeneration, unspecified cervical region: Secondary | ICD-10-CM

## 2023-11-27 DIAGNOSIS — M0600 Rheumatoid arthritis without rheumatoid factor, unspecified site: Secondary | ICD-10-CM | POA: Diagnosis not present

## 2023-11-27 DIAGNOSIS — M47816 Spondylosis without myelopathy or radiculopathy, lumbar region: Secondary | ICD-10-CM | POA: Diagnosis not present

## 2023-11-27 DIAGNOSIS — M069 Rheumatoid arthritis, unspecified: Secondary | ICD-10-CM | POA: Diagnosis not present

## 2023-11-27 DIAGNOSIS — M797 Fibromyalgia: Secondary | ICD-10-CM

## 2023-11-27 DIAGNOSIS — M19071 Primary osteoarthritis, right ankle and foot: Secondary | ICD-10-CM | POA: Diagnosis not present

## 2023-11-27 DIAGNOSIS — F331 Major depressive disorder, recurrent, moderate: Secondary | ICD-10-CM

## 2023-11-27 DIAGNOSIS — M7062 Trochanteric bursitis, left hip: Secondary | ICD-10-CM

## 2023-11-27 DIAGNOSIS — Z96651 Presence of right artificial knee joint: Secondary | ICD-10-CM

## 2023-11-27 DIAGNOSIS — M7061 Trochanteric bursitis, right hip: Secondary | ICD-10-CM

## 2023-11-27 DIAGNOSIS — M19041 Primary osteoarthritis, right hand: Secondary | ICD-10-CM

## 2023-11-27 DIAGNOSIS — R5383 Other fatigue: Secondary | ICD-10-CM | POA: Diagnosis not present

## 2023-11-27 DIAGNOSIS — M25551 Pain in right hip: Secondary | ICD-10-CM | POA: Diagnosis not present

## 2023-11-27 DIAGNOSIS — M19042 Primary osteoarthritis, left hand: Secondary | ICD-10-CM

## 2023-11-27 DIAGNOSIS — Z79891 Long term (current) use of opiate analgesic: Secondary | ICD-10-CM | POA: Diagnosis not present

## 2023-11-27 DIAGNOSIS — G894 Chronic pain syndrome: Secondary | ICD-10-CM | POA: Diagnosis not present

## 2023-11-27 DIAGNOSIS — M25552 Pain in left hip: Secondary | ICD-10-CM | POA: Diagnosis not present

## 2023-11-27 MED ORDER — TRIAMCINOLONE ACETONIDE 40 MG/ML IJ SUSP
10.0000 mg | INTRAMUSCULAR | Status: AC | PRN
  Administered 2023-11-27: 10 mg via INTRAMUSCULAR

## 2023-11-27 MED ORDER — LIDOCAINE HCL 1 % IJ SOLN
0.5000 mL | INTRAMUSCULAR | Status: AC | PRN
  Administered 2023-11-27: .5 mL

## 2023-11-27 NOTE — Progress Notes (Signed)
 Office Visit Note  Patient: Amanda Hammond             Date of Birth: 1971-02-01           MRN: [id number]             PCP: Blanca Reynolds ORN, PA-C Referring: Blanca Reynolds ORN, [place] Visit Date: 11/27/2023 Occupation: @GUAROCC @  Subjective:  Recent flare   History of Present Illness: Amanda Hammond is a 53 y.o. female with history of seronegative rheumatoid arthritis and osteoarthritis.  Patient is currently on Orencia  125 mg subcu days injections once weekly, Arava  20 mg 1 tablet by mouth daily, and Plaquenil  2 mg 1 tablet by mouth twice daily.  She is tolerating combination therapy without any side effects.  She restarted Orencia  about 8 weeks ago.  She has continued to have intermittent flares which she attributes to having the gap in therapy.  Patient states that she did flare last week involving both hands.  Patient states that she is continuing to have some soreness but the swelling has subsided.  She is also having a fibromyalgia flare.  She is experiencing trapezius muscle tension and tenderness bilaterally.  She requested trigger point injections today.  Patient remains in the care of pain management and had a visit today.  She has been taking Percocet 4 times a day for pain relief and remains on Lyrica and Cymbalta  as prescribed.  She is no longer taking tramadol for breakthrough symptoms. She denies any recent or recurrent infections.    Activities of Daily Living:  Patient reports morning stiffness for 4 hours.   Patient Reports nocturnal pain.  Difficulty dressing/grooming: Reports Difficulty climbing stairs: Reports Difficulty getting out of chair: Reports Difficulty using hands for taps, buttons, cutlery, and/or writing: Reports  Review of Systems  Constitutional:  Positive for fatigue.  HENT:  Positive for mouth dryness. Negative for mouth sores.   Eyes:  Negative for dryness.  Respiratory:  Positive for shortness of breath.   Cardiovascular:  Positive for  palpitations. Negative for chest pain.  Gastrointestinal:  Positive for constipation and diarrhea. Negative for blood in stool.  Endocrine: Positive for increased urination.  Genitourinary:  Negative for involuntary urination.  Musculoskeletal:  Positive for joint pain, gait problem, joint pain, joint swelling, myalgias, muscle weakness, morning stiffness, muscle tenderness and myalgias.  Skin:  Negative for color change, rash, hair loss and sensitivity to sunlight.  Allergic/Immunologic: Negative for susceptible to infections.  Neurological:  Positive for dizziness and headaches.  Hematological:  Negative for swollen glands.  Psychiatric/Behavioral:  Positive for depressed mood and sleep disturbance. The patient is nervous/anxious.     PMFS History:  Patient Active Problem List   Diagnosis Date Noted   Rheumatoid factor positive 11/06/2016   Trochanteric bursitis of both hips 11/06/2016   History of fibromyalgia 11/06/2016   DDD (degenerative disc disease), cervical 11/06/2016   Carpal tunnel syndrome, left upper limb 11/06/2016   Carpal tunnel syndrome, right upper limb 11/06/2016   Trigger finger, left middle finger 11/06/2016   Major depressive disorder, recurrent episode, moderate (HCC) 04/27/2016   Pain in left hand 04/25/2016   Pain in right hand 04/25/2016   Primary osteoarthritis of both hands 04/25/2016   Primary osteoarthritis of both knees 04/25/2016   Primary osteoarthritis of both feet 04/25/2016   Fibromyalgia 02/17/2016   Other insomnia 02/17/2016   Fatigue 02/17/2016   B12 DEFICIENCY 02/01/2010   Vitamin D deficiency 02/01/2010   ANEMIA, IRON DEFICIENCY  12/30/2009   OTHER DYSPNEA AND RESPIRATORY ABNORMALITIES 12/30/2009   Nonspecific (abnormal) findings on radiological and other examination of body structure 12/30/2009   COMPUTERIZED TOMOGRAPHY, CHEST, ABNORMAL 12/30/2009    Past Medical History:  Diagnosis Date   Anemia    Anxiety    Depression     Fibromyalgia    Hypertension    Osteoarthritis    Pneumonia    PONV (postoperative nausea and vomiting)    Rheumatoid arthritis (HCC)    Vitamin D deficiency     Family History  Problem Relation Age of Onset   Diabetes Mother    Hypertension Mother    Hypertension Father    Hypertension Brother    Osteoarthritis Brother    Hypertension Brother    Osteoarthritis Brother    Endometriosis Daughter    Anesthesia problems Neg Hx    Hypotension Neg Hx    Pseudochol deficiency Neg Hx    Malignant hyperthermia Neg Hx    Past Surgical History:  Procedure Laterality Date   APPENDECTOMY  2010   [facility]   CARPAL TUNNEL RELEASE Left    CARPAL TUNNEL RELEASE Right 01/22/2023   CHOLECYSTECTOMY  1999   lap, [facility]   GASTRIC BYPASS  2003   [facility]   HERNIA REPAIR  08/2010, 02/2010    incisional, [facility], [facility]   INCISIONAL HERNIA REPAIR  10/12/2010   Procedure: HERNIA REPAIR INCISIONAL;  Surgeon: Oneil DELENA Budge;  Location: [facility];  Service: General;  Laterality: N/A;  Recurrent Incisional Hernia Repair with Mesh   KNEE SURGERY Right 01/2018   meniscal tear    KNEE SURGERY Right 12/2019   PANNICULECTOMY  2008   [facility]   SHOULDER SURGERY Left    TENNIS ELBOW RELEASE/NIRSCHEL PROCEDURE Bilateral    TOTAL KNEE ARTHROPLASTY Right 03/31/2022   Procedure: TOTAL KNEE ARTHROPLASTY;  Surgeon: Shari Sieving, MD;  Location: [facility];  Service: Orthopedics;  Laterality: Right;   Social History   Social History Narrative   Not on file    There is no immunization history on file for this patient.   Objective: Vital Signs: BP 114/80 (BP Location: Left Arm, Patient Position: Sitting, Cuff Size: Normal)   Pulse 86   Resp 14   Ht 5' 5 (1.651 m)   Wt 240 lb (108.9 kg)   BMI 39.94 kg/m    Physical Exam Vitals and nursing note reviewed.  Constitutional:      Appearance: She is well-developed.  HENT:     Head: Normocephalic and atraumatic.  Eyes:     Conjunctiva/sclera: Conjunctivae normal.   Cardiovascular:     Rate and Rhythm: Normal rate and regular rhythm.     Heart sounds: Normal heart sounds.  Pulmonary:     Effort: Pulmonary effort is normal.     Breath sounds: Normal breath sounds.  Abdominal:     General: Bowel sounds are normal.     Palpations: Abdomen is soft.  Musculoskeletal:     Cervical back: Normal range of motion.  Lymphadenopathy:     Cervical: No cervical adenopathy.  Skin:    General: Skin is warm and dry.     Capillary Refill: Capillary refill takes less than 2 seconds.  Neurological:     Mental Status: She is alert and oriented to person, place, and time.  Psychiatric:        Behavior: Behavior normal.      Musculoskeletal Exam: Generalized hyperalgesia and positive tender points on exam.  Painful limited range of  motion with the cervical spine.  Trapezius muscle tension tenderness bilaterally.  Painful limited mobility of both shoulders especially the right shoulder with abduction.  PIP DIP thickening consistent with osteoarthritis of both hands.  No synovitis of MCP joints.  Hip joints have good range of motion.  Right knee replacement has good range of motion with no warmth or effusion.  The left knee joint has some discomfort with no warmth or effusion.  Ankle joints have good range of motion with no synovitis.  CDAI Exam: CDAI Score: -- Patient Global: --; Provider Global: -- Swollen: --; Tender: -- Joint Exam 11/27/2023   No joint exam has been documented for this visit   There is currently no information documented on the homunculus. Go to the Rheumatology activity and complete the homunculus joint exam.  Investigation: No additional findings.  Imaging: No results found.  Recent Labs: Lab Results  Component Value Date   WBC 10.0 09/11/2023   HGB 11.1 (L) 09/11/2023   PLT 355 09/11/2023   NA 134 (L) 09/11/2023   K 4.3 09/11/2023   CL 96 (L) 09/11/2023   CO2 30 09/11/2023   GLUCOSE 99 09/11/2023   BUN 18 09/11/2023    CREATININE 0.79 09/11/2023   BILITOT 0.3 09/11/2023   ALKPHOS 87 03/22/2022   AST 12 09/11/2023   ALT 14 09/11/2023   PROT 6.3 09/11/2023   ALBUMIN 3.4 (L) 03/22/2022   CALCIUM 8.9 09/11/2023   GFRAA 67 09/02/2020   QFTBGOLDPLUS NEGATIVE 02/27/2023    Speciality Comments: Orencia  started 08/11/21  PLQ eye exam scheduled for 07/16/2023 per patient  Procedures:  Trigger Point Inj  Date/Time: 11/27/2023 2:11 PM  Performed by: Cheryl Waddell HERO, PA-C Authorized by: Cheryl Waddell HERO, PA-C   Consent Given by:  Patient Site marked: the procedure site was marked   Timeout: prior to procedure the correct patient, procedure, and site was verified   Indications:  Pain Total # of Trigger Points:  2 Location: neck   Needle Size:  27 G Approach:  Dorsal Medications #1:  0.5 mL lidocaine  1 %; 10 mg triamcinolone  acetonide 40 MG/ML Medications #2:  0.5 mL lidocaine  1 %; 10 mg triamcinolone  acetonide 40 MG/ML Patient tolerance:  Patient tolerated the procedure well with no immediate complications  Allergies: Patient has no known allergies.    Assessment / Plan:     Visit Diagnoses: Seronegative rheumatoid arthritis (HCC): She has no synovitis on examination today.  She had a flare involving both hands last week which has since subsided.  She remains on Orencia  125 mg sq injections once weekly, Arava  20 mg 1 tablet by mouth daily, Plaquenil  200 mg 1 tablet by mouth twice daily.  She is tolerating combination therapy.  She has been back on Orencia  for about 8 weeks and is hopeful that she will start having less frequent flares if she is able to remain on combination therapy without interruption going forward.  She does not want to make any medication changes at this time.  She remains under the care of pain management and has been taking Percocet 4 times a day and remains on Lyrica and Cymbalta  as prescribed.  She continues to have frequent flares of fibromyalgia which contribute to her pain levels. She  will remain on Orencia , Arava , and Plaquenil  as prescribed.  She was advised to notify us  if she develops any new or worsening symptoms.  She will continue to require close follow-up every 3 months.  High risk medication use -Orencia   125 mg sq injections once weekly, Arava  20 mg 1 tablet by mouth daily, and Plaquenil  200 mg 1 tablet by mouth twice daily. CBC and CMP updated on 09/11/23.  Order for CBC and CMP released today. TB gold negative 02/27/23. Lipid panel updated on 06/19/2023. Discussed the importance of holding Orencia  and Arava  if she develop signs or symptoms of an infection and to resume once the infection has completely cleared. No updated plaquenil  eye exam on file--plan to call to obtain records.  Plan: CBC with Differential/Platelet, Comprehensive metabolic panel with GFR  Primary osteoarthritis of both hands: She has PIP and DIP thickening consistent with osteoarthritis of both hands.  Patient continues to have occasional flares which she attributes to gaps in therapy with Orencia .  She has been back on Orencia  consistently for about 8 weeks and has no active inflammation.   Trochanteric bursitis of both hips: Intermittent discomfort.  Encouraged the patient to perform daily stretching exercises.  Primary osteoarthritis of left knee: Intermittent discomfort.  No warmth or effusion noted.  S/P total knee arthroplasty, right - Dr. Coletta 03/31/22: Doing well.  No effusion noted.   Primary osteoarthritis of both feet: Good range of motion of both ankle joints with no tenderness or synovitis.  Fibromyalgia: She has generalized hyperalgesia and positive tender points on exam.  Patient presents today experiencing a fibromyalgia flare.  She remains under the care of pain management and had an office visit today.  She has been taking Percocet 4 times a day and remains on Lyrica and Cymbalta  as prescribed.  She is no longer taking tramadol for breakthrough symptoms. She presents today  with recurrence of trapezius muscle tension and tenderness bilaterally.  She requested repeat trigger point injections today.  She tolerated the procedures well.  Procedure notes were completed above.  Aftercare was discussed.  DDD (degenerative disc disease), cervical: C-spine has limited ROM with lateral rotation.  Trapezius msucle tensoin and tenderness bilaterally.  Trigger point injections performed today.   Trapezius muscle spasm: Patient presents today with trapezius muscle tension and tenderness bilaterally.  Patient requested repeat trigger point injections today.  She tolerated the procedures well.  Procedure note was completed above.  Aftercare was discussed.  Other medical conditions are listed as follows:   Other insomnia  Other fatigue  Vitamin D deficiency  Major depressive disorder, recurrent episode, moderate (HCC)  Orders: Orders Placed This Encounter  Procedures   Trigger Point Inj   CBC with Differential/Platelet   Comprehensive metabolic panel with GFR   No orders of the defined types were placed in this encounter.    Follow-Up Instructions: Return in about 3 months (around 02/27/2024) for Rheumatoid arthritis.   Waddell CHRISTELLA Craze, PA-C  Note - This record has been created using Dragon software.  Chart creation errors have been sought, but may not always  have been located. Such creation errors do not reflect on  the standard of medical care.

## 2023-11-28 ENCOUNTER — Other Ambulatory Visit: Payer: Self-pay

## 2023-11-28 ENCOUNTER — Ambulatory Visit: Payer: Self-pay | Admitting: Physician Assistant

## 2023-11-28 ENCOUNTER — Other Ambulatory Visit: Payer: Self-pay | Admitting: Physician Assistant

## 2023-11-28 DIAGNOSIS — Z79899 Other long term (current) drug therapy: Secondary | ICD-10-CM

## 2023-11-28 DIAGNOSIS — M0600 Rheumatoid arthritis without rheumatoid factor, unspecified site: Secondary | ICD-10-CM

## 2023-11-28 LAB — COMPREHENSIVE METABOLIC PANEL WITH GFR
AG Ratio: 1.8 (calc) (ref 1.0–2.5)
ALT: 13 U/L (ref 6–29)
AST: 15 U/L (ref 10–35)
Albumin: 4.2 g/dL (ref 3.6–5.1)
Alkaline phosphatase (APISO): 115 U/L (ref 37–153)
BUN: 11 mg/dL (ref 7–25)
CO2: 27 mmol/L (ref 20–32)
Calcium: 9 mg/dL (ref 8.6–10.4)
Chloride: 99 mmol/L (ref 98–110)
Creat: 0.82 mg/dL (ref 0.50–1.03)
Globulin: 2.3 g/dL (ref 1.9–3.7)
Glucose, Bld: 83 mg/dL (ref 65–99)
Potassium: 3.9 mmol/L (ref 3.5–5.3)
Sodium: 134 mmol/L — ABNORMAL LOW (ref 135–146)
Total Bilirubin: 0.4 mg/dL (ref 0.2–1.2)
Total Protein: 6.5 g/dL (ref 6.1–8.1)
eGFR: 86 mL/min/1.73m2 (ref >=60)

## 2023-11-28 LAB — CBC WITH DIFFERENTIAL/PLATELET
Absolute Lymphocytes: 3432 {cells}/uL (ref 850–3900)
Absolute Monocytes: 902 {cells}/uL (ref 200–950)
Basophils Absolute: 46 {cells}/uL (ref 0–200)
Basophils Relative: 0.5 %
Eosinophils Absolute: 92 {cells}/uL (ref 15–500)
Eosinophils Relative: 1 %
HCT: 37.5 % (ref 35.0–45.0)
Hemoglobin: 11.4 g/dL — ABNORMAL LOW (ref 11.7–15.5)
MCH: 24.1 pg — ABNORMAL LOW (ref 27.0–33.0)
MCHC: 30.4 g/dL — ABNORMAL LOW (ref 32.0–36.0)
MCV: 79.3 fL — ABNORMAL LOW (ref 80.0–100.0)
MPV: 10.5 fL (ref 7.5–12.5)
Monocytes Relative: 9.8 %
Neutro Abs: 4729 {cells}/uL (ref 1500–7800)
Neutrophils Relative %: 51.4 %
Platelets: 312 Thousand/uL (ref 140–400)
RBC: 4.73 Million/uL (ref 3.80–5.10)
RDW: 14.6 % (ref 11.0–15.0)
Total Lymphocyte: 37.3 %
WBC: 9.2 Thousand/uL (ref 3.8–10.8)

## 2023-11-28 MED ORDER — ORENCIA CLICKJECT 125 MG/ML ~~LOC~~ SOAJ
125.0000 mg | SUBCUTANEOUS | 0 refills | Status: DC
  Filled 2023-11-28: qty 12, 84d supply, fill #0
  Filled 2023-11-29 – 2023-11-30 (×2): qty 4, 28d supply, fill #0
  Filled 2023-12-27 – 2024-01-02 (×2): qty 4, 28d supply, fill #1
  Filled 2024-01-23 – 2024-01-25 (×2): qty 4, 28d supply, fill #2

## 2023-11-28 NOTE — Telephone Encounter (Signed)
 Last Fill: 09/12/2023  Labs: 11/27/2023 Sodium is borderline low. Rest of CMP WNL Hemoglobin remains low but has improved.  Rest of CBC stable. We will continue to monito  TB Gold: 02/27/2023 TB Gold Negative    Next Visit: 02/27/2024  Last Visit: 11/27/2023  DX: Seronegative rheumatoid arthritis   Current Dose per office note 11/27/2023: Orencia  125 mg sq injections once weekly   Okay to refill Orencia ?

## 2023-11-28 NOTE — Progress Notes (Signed)
 Sodium is borderline low. Rest of CMP WNL Hemoglobin remains low but has improved.  Rest of CBC stable. We will continue to monitor.

## 2023-11-29 ENCOUNTER — Other Ambulatory Visit (HOSPITAL_COMMUNITY): Payer: Self-pay

## 2023-11-29 ENCOUNTER — Other Ambulatory Visit: Payer: Self-pay

## 2023-11-30 ENCOUNTER — Other Ambulatory Visit (HOSPITAL_COMMUNITY): Payer: Self-pay

## 2023-11-30 ENCOUNTER — Other Ambulatory Visit: Payer: Self-pay

## 2023-11-30 NOTE — Progress Notes (Signed)
 Specialty Pharmacy Refill Coordination Note  Amanda Hammond is a 53 y.o. female contacted today regarding refills of specialty medication(s) Abatacept  (Orencia  [employer]ect)   Patient requested Delivery   Delivery date: 12/05/23   Verified address: [address]   [place] [address]   Medication will be filled on 12/04/23.

## 2023-12-04 ENCOUNTER — Other Ambulatory Visit: Payer: Self-pay

## 2023-12-27 ENCOUNTER — Other Ambulatory Visit (HOSPITAL_COMMUNITY): Payer: Self-pay

## 2023-12-29 ENCOUNTER — Other Ambulatory Visit: Payer: Self-pay | Admitting: Physician Assistant

## 2023-12-29 DIAGNOSIS — M0600 Rheumatoid arthritis without rheumatoid factor, unspecified site: Secondary | ICD-10-CM

## 2023-12-31 ENCOUNTER — Other Ambulatory Visit: Payer: Self-pay

## 2023-12-31 NOTE — Telephone Encounter (Signed)
 Last Fill: 09/12/2023  Labs: 11/27/2023 Sodium is borderline low. Rest of CMP WNL Hemoglobin remains low but has improved.  Rest of CBC stable.  Next Visit: 02/27/2024  Last Visit: 11/27/2023  DX: Seronegative rheumatoid arthritis   Current Dose per office note 11/27/2023: Arava  20 mg 1 tablet by mouth daily   Okay to refill Arava  ?

## 2024-01-01 ENCOUNTER — Other Ambulatory Visit (HOSPITAL_COMMUNITY): Payer: Self-pay

## 2024-01-02 ENCOUNTER — Other Ambulatory Visit: Payer: Self-pay

## 2024-01-02 NOTE — Progress Notes (Signed)
 Specialty Pharmacy Refill Coordination Note  Amanda Hammond is a 53 y.o. female contacted today regarding refills of specialty medication(s) Abatacept  (Orencia  [employer]ect)   Patient requested Delivery   Delivery date: 01/03/24   Verified address: [address]   [place] [address]   Medication will be filled on 01/02/24.

## 2024-01-23 ENCOUNTER — Other Ambulatory Visit (HOSPITAL_COMMUNITY): Payer: Self-pay

## 2024-01-25 ENCOUNTER — Other Ambulatory Visit (HOSPITAL_COMMUNITY): Payer: Self-pay

## 2024-01-29 ENCOUNTER — Other Ambulatory Visit: Payer: Self-pay

## 2024-01-29 NOTE — Progress Notes (Signed)
 Specialty Pharmacy Refill Coordination Note  Amanda Hammond is a 53 y.o. female contacted today regarding refills of specialty medication(s) Abatacept  (Orencia  ClickJect)   Patient requested Delivery   Delivery date: 01/31/24   Verified address: [address]   [place] [address]   Medication will be filled on: 01/30/24

## 2024-01-30 ENCOUNTER — Other Ambulatory Visit (HOSPITAL_COMMUNITY): Payer: Self-pay

## 2024-02-04 ENCOUNTER — Encounter: Payer: Self-pay | Admitting: Radiology

## 2024-02-21 ENCOUNTER — Other Ambulatory Visit (HOSPITAL_COMMUNITY): Payer: Self-pay

## 2024-02-21 ENCOUNTER — Other Ambulatory Visit: Payer: Self-pay | Admitting: Physician Assistant

## 2024-02-21 ENCOUNTER — Other Ambulatory Visit: Payer: Self-pay

## 2024-02-21 DIAGNOSIS — Z79899 Other long term (current) drug therapy: Secondary | ICD-10-CM

## 2024-02-21 DIAGNOSIS — M0600 Rheumatoid arthritis without rheumatoid factor, unspecified site: Secondary | ICD-10-CM

## 2024-02-21 MED ORDER — ORENCIA CLICKJECT 125 MG/ML ~~LOC~~ SOAJ
125.0000 mg | SUBCUTANEOUS | 0 refills | Status: AC
  Filled 2024-02-21: qty 12, 84d supply, fill #0
  Filled 2024-02-26: qty 4, 28d supply, fill #0
  Filled 2024-03-24: qty 4, 28d supply, fill #1
  Filled 2024-04-21: qty 4, 28d supply, fill #2

## 2024-02-21 NOTE — Telephone Encounter (Signed)
 Last Fill: 11/28/2023  Labs: 11/27/2023 Sodium is borderline low. Rest of CMP WNL Hemoglobin remains low but has improved.  Rest of CBC stable.  TB Gold: 02/27/2023 Neg    Next Visit: 02/27/2024  Last Visit: 11/27/2023  IK:Dzmnwzhjupcz rheumatoid arthritis   Current Dose per office note 11/27/2023: Orencia  125 mg sq injections once weekly   Okay to refill Orencia ?

## 2024-02-26 ENCOUNTER — Other Ambulatory Visit: Payer: Self-pay

## 2024-02-26 NOTE — Progress Notes (Signed)
 Specialty Pharmacy Refill Coordination Note  Amanda Hammond is a 53 y.o. female contacted today regarding refills of specialty medication(s) Abatacept  (Orencia  ClickJect)   Patient requested Delivery   Delivery date: 03/04/24   Verified address: [address]   [place] [address]   Medication will be filled on: 03/03/24

## 2024-02-27 ENCOUNTER — Ambulatory Visit: Admitting: Physician Assistant

## 2024-03-03 ENCOUNTER — Other Ambulatory Visit: Payer: Self-pay

## 2024-03-15 ENCOUNTER — Other Ambulatory Visit: Payer: Self-pay | Admitting: Physician Assistant

## 2024-03-15 DIAGNOSIS — M0600 Rheumatoid arthritis without rheumatoid factor, unspecified site: Secondary | ICD-10-CM

## 2024-03-15 DIAGNOSIS — Z79899 Other long term (current) drug therapy: Secondary | ICD-10-CM

## 2024-03-24 ENCOUNTER — Other Ambulatory Visit: Payer: Self-pay

## 2024-03-24 ENCOUNTER — Other Ambulatory Visit: Payer: Self-pay | Admitting: Physician Assistant

## 2024-03-24 ENCOUNTER — Telehealth: Payer: Self-pay | Admitting: *Deleted

## 2024-03-24 DIAGNOSIS — M0600 Rheumatoid arthritis without rheumatoid factor, unspecified site: Secondary | ICD-10-CM

## 2024-03-24 NOTE — Telephone Encounter (Signed)
 Last Fill: 12/31/2023  Labs: 11/27/2023 Sodium is borderline low. Rest of CMP WNL Hemoglobin remains low but has improved.  Rest of CBC stable. We will continue to monitor.   Next Visit: 05/01/2024  Last Visit: 11/27/2023  DX: Seronegative rheumatoid arthritis (HCC)   Current Dose per office note 11/27/2023: Arava  20 mg 1 tablet by mouth daily   Attempted to contact the patient and left a message to advise she is due to update labs. CBC and CMP already in order review.   Okay to refill Arava  ?

## 2024-03-24 NOTE — Telephone Encounter (Signed)
 Received a call from [facility], Orencia  will no longer be covered as of 04/03/2024. Alternatives are Xeljanz, Rinvoq, adalimumab-adaz.

## 2024-03-26 ENCOUNTER — Other Ambulatory Visit: Payer: Self-pay

## 2024-03-26 NOTE — Progress Notes (Signed)
 Specialty Pharmacy Refill Coordination Note  Amanda Hammond is a 53 y.o. female contacted today regarding refills of specialty medication(s) Abatacept  (Orencia  [employer])   Patient requested Delivery   Delivery date: 04/01/24   Verified address: [address]   [place] [address]   Medication will be filled on: 03/31/24

## 2024-03-27 NOTE — Telephone Encounter (Signed)
 We will need to schedule a sooner office visit to discuss treatment options in detail and obtain consent.

## 2024-03-31 ENCOUNTER — Other Ambulatory Visit: Payer: Self-pay

## 2024-03-31 NOTE — Telephone Encounter (Signed)
 LMOM for patient to call and schedule sooner appointment to discuss treatment options.

## 2024-04-01 ENCOUNTER — Other Ambulatory Visit (HOSPITAL_COMMUNITY): Payer: Self-pay

## 2024-04-01 NOTE — Telephone Encounter (Signed)
 Spoke with patient and scheduled patient for 04/15/2024 at 10:00 am to discuss treatment options.

## 2024-04-06 NOTE — Progress Notes (Unsigned)
 "  Office Visit Note  Patient: ESSANCE GATTI             Date of Birth: 22-May-1970           MRN: [id number]             PCP: Blanca Reynolds ORN, PA-C Referring: Blanca Reynolds ORN, PA-C Visit Date: 04/15/2024 Occupation: Data Unavailable  Subjective:  No chief complaint on file.   History of Present Illness: MURLENE REVELL is a 54 y.o. female ***     Activities of Daily Living:  Patient reports morning stiffness for *** {minute/hour:19697}.   Patient {ACTIONS;DENIES/REPORTS:21021675::Denies} nocturnal pain.  Difficulty dressing/grooming: {ACTIONS;DENIES/REPORTS:21021675::Denies} Difficulty climbing stairs: {ACTIONS;DENIES/REPORTS:21021675::Denies} Difficulty getting out of chair: {ACTIONS;DENIES/REPORTS:21021675::Denies} Difficulty using hands for taps, buttons, cutlery, and/or writing: {ACTIONS;DENIES/REPORTS:21021675::Denies}  No Rheumatology ROS completed.   PMFS History:  Patient Active Problem List   Diagnosis Date Noted   Rheumatoid factor positive 11/06/2016   Trochanteric bursitis of both hips 11/06/2016   History of fibromyalgia 11/06/2016   DDD (degenerative disc disease), cervical 11/06/2016   Carpal tunnel syndrome, left upper limb 11/06/2016   Carpal tunnel syndrome, right upper limb 11/06/2016   Trigger finger, left middle finger 11/06/2016   Major depressive disorder, recurrent episode, moderate (HCC) 04/27/2016   Pain in left hand 04/25/2016   Pain in right hand 04/25/2016   Primary osteoarthritis of both hands 04/25/2016   Primary osteoarthritis of both knees 04/25/2016   Primary osteoarthritis of both feet 04/25/2016   Fibromyalgia 02/17/2016   Other insomnia 02/17/2016   Fatigue 02/17/2016   B12 DEFICIENCY 02/01/2010   Vitamin D deficiency 02/01/2010   ANEMIA, IRON DEFICIENCY 12/30/2009   OTHER DYSPNEA AND RESPIRATORY ABNORMALITIES 12/30/2009   Nonspecific (abnormal) findings on radiological and other examination of body structure  12/30/2009   COMPUTERIZED TOMOGRAPHY, CHEST, ABNORMAL 12/30/2009    Past Medical History:  Diagnosis Date   Anemia    Anxiety    Depression    Fibromyalgia    Hypertension    Osteoarthritis    Pneumonia    PONV (postoperative nausea and vomiting)    Rheumatoid arthritis (HCC)    Vitamin D deficiency     Family History  Problem Relation Age of Onset   Diabetes Mother    Hypertension Mother    Hypertension Father    Hypertension Brother    Osteoarthritis Brother    Hypertension Brother    Osteoarthritis Brother    Endometriosis Daughter    Anesthesia problems Neg Hx    Hypotension Neg Hx    Pseudochol deficiency Neg Hx    Malignant hyperthermia Neg Hx    Past Surgical History:  Procedure Laterality Date   APPENDECTOMY  2010   [facility]   CARPAL TUNNEL RELEASE Left    CARPAL TUNNEL RELEASE Right 01/22/2023   CHOLECYSTECTOMY  1999   lap, [facility]   GASTRIC BYPASS  2003   [facility]   HERNIA REPAIR  08/2010, 02/2010    incisional, [facility], [facility]   INCISIONAL HERNIA REPAIR  10/12/2010   Procedure: HERNIA REPAIR INCISIONAL;  Surgeon: Oneil DELENA Budge;  Location: [facility];  Service: General;  Laterality: N/A;  Recurrent Incisional Hernia Repair with Mesh   KNEE SURGERY Right 01/2018   meniscal tear    KNEE SURGERY Right 12/2019   PANNICULECTOMY  2008   [facility]   SHOULDER SURGERY Left    TENNIS ELBOW RELEASE/NIRSCHEL PROCEDURE Bilateral    TOTAL KNEE ARTHROPLASTY Right 03/31/2022   Procedure: TOTAL KNEE ARTHROPLASTY;  Surgeon: Shari Sieving, MD;  Location: [facility];  Service: Orthopedics;  Laterality: Right;   Social History[1] Social History   Social History Narrative   Not on file      There is no immunization history on file for this patient.   Objective: Vital Signs: There were no vitals taken for this visit.   Physical Exam   Musculoskeletal Exam: ***  CDAI Exam: CDAI Score: -- Patient Global: --; Provider Global: -- Swollen: --; Tender: -- Joint Exam 04/15/2024   No  joint exam has been documented for this visit   There is currently no information documented on the homunculus. Go to the Rheumatology activity and complete the homunculus joint exam.  Investigation: No additional findings.  Imaging: No results found.  Recent Labs: Lab Results  Component Value Date   WBC 9.2 11/27/2023   HGB 11.4 (L) 11/27/2023   PLT 312 11/27/2023   NA 134 (L) 11/27/2023   K 3.9 11/27/2023   CL 99 11/27/2023   CO2 27 11/27/2023   GLUCOSE 83 11/27/2023   BUN 11 11/27/2023   CREATININE 0.82 11/27/2023   BILITOT 0.4 11/27/2023   ALKPHOS 87 03/22/2022   AST 15 11/27/2023   ALT 13 11/27/2023   PROT 6.5 11/27/2023   ALBUMIN 3.4 (L) 03/22/2022   CALCIUM 9.0 11/27/2023   GFRAA 67 09/02/2020   QFTBGOLDPLUS NEGATIVE 02/27/2023    Speciality Comments: Orencia  started 08/11/21  PLQ eye exam scheduled for 02/2024 at [facility] in [place] per patient  Procedures:  No procedures performed Allergies: Patient has no known allergies.   Assessment / Plan:     Visit Diagnoses: No diagnosis found.  Orders: No orders of the defined types were placed in this encounter.  No orders of the defined types were placed in this encounter.   Face-to-face time spent with patient was *** minutes. Greater than 50% of time was spent in counseling and coordination of care.  Follow-Up Instructions: No follow-ups on file.   Maya Nash, MD  Note - This record has been created using Animal nutritionist.  Chart creation errors have been sought, but may not always  have been located. Such creation errors do not reflect on  the standard of medical care.    [1]  Social History Tobacco Use   Smoking status: Never    Passive exposure: Never   Smokeless tobacco: Never  Vaping Use   Vaping status: Never Used  Substance Use Topics   Alcohol use: No   Drug use: Never   "

## 2024-04-10 ENCOUNTER — Encounter (INDEPENDENT_AMBULATORY_CARE_PROVIDER_SITE_OTHER): Payer: Self-pay | Admitting: *Deleted

## 2024-04-14 NOTE — Progress Notes (Unsigned)
 "  Office Visit Note  Patient: Amanda Hammond             Date of Birth: 1970-07-27           MRN: [id number]             PCP: Blanca Reynolds ORN, PA-C Referring: Blanca Reynolds ORN, [place] Visit Date: 04/16/2024 Occupation: Data Unavailable  Subjective:  Pain in left leg  History of Present Illness: Amanda Hammond is a 54 y.o. female seronegative rheumatoid arthritis, osteoarthritis and degenerative disc disease.  She returns today after her last visit in August 2025.  She states she has had good days and bad days with rheumatoid arthritis.  She gives history of morning stiffness, nocturnal pain, discomfort during routine activities.  She notices discomfort and swelling in her hands especially over the DIP joints.  She continues to be on Orencia  125 mg subcu weekly, leflunomide  20 mg p.o. daily.  She states she ran out of Plaquenil  about a month ago and has not filled it.  She continues to have some generalized pain and discomfort for fibromyalgia.  Recently she has been having pain and discomfort in her left calf area.  She was evaluated by her PCP and had MRI which showed some degenerative changes.  She has an appointment coming up with the neurosurgeon.  She continues to be on Percocet, Cymbalta  and Lyrica by pain management.    Activities of Daily Living:  Patient reports morning stiffness for all day. Patient Reports nocturnal pain.  Difficulty dressing/grooming: Reports Difficulty climbing stairs: Reports Difficulty getting out of chair: Reports Difficulty using hands for taps, buttons, cutlery, and/or writing: Reports  Review of Systems  Constitutional:  Positive for fatigue.  HENT:  Positive for mouth dryness. Negative for mouth sores.   Eyes:  Positive for dryness.  Respiratory:  Positive for shortness of breath.   Cardiovascular:  Negative for chest pain and palpitations.  Gastrointestinal:  Positive for constipation and diarrhea. Negative for blood in stool.  Endocrine:  Positive for increased urination.  Genitourinary:  Positive for involuntary urination.  Musculoskeletal:  Positive for joint pain, gait problem, joint pain, joint swelling, myalgias, muscle weakness, morning stiffness, muscle tenderness and myalgias.  Skin:  Negative for color change, rash, hair loss and sensitivity to sunlight.  Allergic/Immunologic: Positive for susceptible to infections.  Neurological:  Positive for headaches. Negative for dizziness.  Hematological:  Negative for swollen glands.  Psychiatric/Behavioral:  Positive for depressed mood and sleep disturbance. The patient is nervous/anxious.     PMFS History:  Patient Active Problem List   Diagnosis Date Noted   Rheumatoid factor positive 11/06/2016   Trochanteric bursitis of both hips 11/06/2016   History of fibromyalgia 11/06/2016   DDD (degenerative disc disease), cervical 11/06/2016   Carpal tunnel syndrome, left upper limb 11/06/2016   Carpal tunnel syndrome, right upper limb 11/06/2016   Trigger finger, left middle finger 11/06/2016   Major depressive disorder, recurrent episode, moderate (HCC) 04/27/2016   Pain in left hand 04/25/2016   Pain in right hand 04/25/2016   Primary osteoarthritis of both hands 04/25/2016   Primary osteoarthritis of both knees 04/25/2016   Primary osteoarthritis of both feet 04/25/2016   Fibromyalgia 02/17/2016   Other insomnia 02/17/2016   Fatigue 02/17/2016   B12 DEFICIENCY 02/01/2010   Vitamin D deficiency 02/01/2010   ANEMIA, IRON DEFICIENCY 12/30/2009   OTHER DYSPNEA AND RESPIRATORY ABNORMALITIES 12/30/2009   Nonspecific (abnormal) findings on radiological and other examination of body  structure 12/30/2009   COMPUTERIZED TOMOGRAPHY, CHEST, ABNORMAL 12/30/2009    Past Medical History:  Diagnosis Date   Anemia    Anxiety    Depression    Fibromyalgia    Hypertension    Osteoarthritis    Pneumonia    PONV (postoperative nausea and vomiting)    Rheumatoid arthritis (HCC)     Vitamin D deficiency     Family History  Problem Relation Age of Onset   Diabetes Mother    Hypertension Mother    Hypertension Father    Hypertension Brother    Osteoarthritis Brother    Hypertension Brother    Osteoarthritis Brother    Endometriosis Daughter    Anesthesia problems Neg Hx    Hypotension Neg Hx    Pseudochol deficiency Neg Hx    Malignant hyperthermia Neg Hx    Past Surgical History:  Procedure Laterality Date   APPENDECTOMY  2010   [facility]   CARPAL TUNNEL RELEASE Left    CARPAL TUNNEL RELEASE Right 01/22/2023   CHOLECYSTECTOMY  1999   lap, [facility]   GASTRIC BYPASS  2003   [facility]   HERNIA REPAIR  08/2010, 02/2010    incisional, [facility], [facility]   INCISIONAL HERNIA REPAIR  10/12/2010   Procedure: HERNIA REPAIR INCISIONAL;  Surgeon: Oneil DELENA Budge;  Location: [facility];  Service: General;  Laterality: N/A;  Recurrent Incisional Hernia Repair with Mesh   KNEE SURGERY Right 01/2018   meniscal tear    KNEE SURGERY Right 12/2019   PANNICULECTOMY  2008   [facility]   SHOULDER SURGERY Left    TENNIS ELBOW RELEASE/NIRSCHEL PROCEDURE Bilateral    TOTAL KNEE ARTHROPLASTY Right 03/31/2022   Procedure: TOTAL KNEE ARTHROPLASTY;  Surgeon: Shari Sieving, MD;  Location: [facility];  Service: Orthopedics;  Laterality: Right;   Social History[1] Social History   Social History Narrative   Not on file      There is no immunization history on file for this patient.   Objective: Vital Signs: BP 124/83   Pulse (!) 112   Temp 98 F (36.7 C)   Resp 17   Ht 5' 5 (1.651 m)   Wt 238 lb (108 kg)   BMI 39.61 kg/m    Physical Exam Vitals and nursing note reviewed.  Constitutional:      Appearance: She is well-developed.  HENT:     Head: Normocephalic and atraumatic.  Eyes:     Conjunctiva/sclera: Conjunctivae normal.  Cardiovascular:     Rate and Rhythm: Normal rate and regular rhythm.     Heart sounds: Normal heart sounds.  Pulmonary:     Effort: Pulmonary effort is normal.      Breath sounds: Normal breath sounds.  Abdominal:     General: Bowel sounds are normal.     Palpations: Abdomen is soft.  Musculoskeletal:     Cervical back: Normal range of motion.  Lymphadenopathy:     Cervical: No cervical adenopathy.  Skin:    General: Skin is warm and dry.     Capillary Refill: Capillary refill takes less than 2 seconds.  Neurological:     Mental Status: She is alert and oriented to person, place, and time.  Psychiatric:        Behavior: Behavior normal.      Musculoskeletal Exam: She had limited painful range of motion of the cervical spine.  Thoracic kyphosis was noted.  She had limited painful range of motion of the lumbar spine.  She had limited  painful range of motion of her shoulder joints with abduction about 120 degrees bilaterally.  Elbow joints and wrist joints in good range of motion.  She synovial thickening over MCP joints.  Synovitis was noted in some of the joints as described below.  She had discomfort range of motion of her hip joints and knee joints.  Tenderness over left ankle joint.  No warmth swelling or effusion was noted.  There was no tenderness across MTPs.  CDAI Exam: CDAI Score: 24  Patient Global: 70 / 100; Provider Global: 50 / 100 Swollen: 2 ; Tender: 11  Joint Exam 04/16/2024      Right  Left  Glenohumeral   Tender   Tender  Wrist   Tender   Tender  MCP 2   Tender   Tender  PIP 3 (finger)  Swollen Tender     PIP 4 (finger)  Swollen Tender     Knee   Tender   Tender  Ankle      Tender     Investigation: No additional findings.  Imaging: No results found.  Recent Labs: Lab Results  Component Value Date   WBC 9.2 11/27/2023   HGB 11.4 (L) 11/27/2023   PLT 312 11/27/2023   NA 134 (L) 11/27/2023   K 3.9 11/27/2023   CL 99 11/27/2023   CO2 27 11/27/2023   GLUCOSE 83 11/27/2023   BUN 11 11/27/2023   CREATININE 0.82 11/27/2023   BILITOT 0.4 11/27/2023   ALKPHOS 87 03/22/2022   AST 15 11/27/2023   ALT 13 11/27/2023    PROT 6.5 11/27/2023   ALBUMIN 3.4 (L) 03/22/2022   CALCIUM 9.0 11/27/2023   GFRAA 67 09/02/2020   QFTBGOLDPLUS NEGATIVE 02/27/2023   April 07, 2024 MRI of the lumbar spine L5-S1 left foraminal disc extrusion with marked left neuroforaminal narrowing and compression of the exiting left L5 nerve root.  Moderate right L5-S1 neuroforaminal narrowing.  Mild spinal stenosis L3-L4 and L4-L5.  Grade 1 anterolisthesis L5-S1 by 2 mm with question bilateral L5 5 pars defect. Read by Dr. Corean Henry  Speciality Comments: Orencia  started 08/11/21  PLQ eye exam scheduled for 02/2024 at [facility] in [place] per patient  Procedures:  Trigger Point Inj  Date/Time: 04/16/2024 1:23 PM  Performed by: Dolphus Reiter, MD Authorized by: Dolphus Reiter, MD   Consent Given by:  Patient Site marked: the procedure site was marked   Timeout: prior to procedure the correct patient, procedure, and site was verified   Indications:  Muscle spasm and pain Total # of Trigger Points:  2 Location: neck   Needle Size:  27 G Approach:  Dorsal Medications #1:  0.5 mL lidocaine  1 %; 20 mg triamcinolone  acetonide 40 MG/ML Medications #2:  0.5 mL lidocaine  1 %; 20 mg triamcinolone  acetonide 40 MG/ML Patient tolerance:  Patient tolerated the procedure well with no immediate complications Comments: Risk of infection, tendon injury, nerve injury, hypopigmentation and dermal atrophy were discussed.  Allergies: Patient has no known allergies.   Assessment / Plan:     Visit Diagnoses: Seronegative rheumatoid arthritis (HCC)-patient reports having increased pain and discomfort in multiple joints.  She gives history of frequent swelling of her joints.  She states she ran out of Plaquenil  and could not get an eye examination.  Her last Orencia  dose was a week ago.  She states that she would not have any more Orencia  till its been approved.  She remains on Arava  20 mg p.o. daily.  Synovitis  and synovial  thickening was noted in some of the joints.  I advised her that we would not be able to refill Plaquenil  until she gets the eye examination.  Recently it prior authorization on Orencia  as it was declined by her insurance company.  Her disease has been stable on Orencia  for many years.  1 Orencia  sample was given to the patient as she will be running out of her Orencia .  High risk medication use - Orencia  125 mg sq injections once weekly, Arava  20 mg 1 tablet by mouth daily, and (Plaquenil  200 mg 1 tablet by mouth twice daily ran out about a month ago). - Plan: CBC with Differential/Platelet, Comprehensive metabolic panel with GFR, QuantiFERON-TB Gold Plus.  She has not had labs since August 2025.  She was slightly anemic and CMP was normal.  TB Gold was negative on February 27, 2023.  Need to Labs every 3 months was emphasized.  Will get TB Gold today.  Information reimmunization was placed in the AVS.  She was advised to hold Orencia  and leflunomide  if she develops an infection and resume after the infection resolves.  Annual skin examination to screen for skin cancer was advised.  She is on Orencia .  Strict use of sunscreen and sun protection was discussed.  Primary osteoarthritis of both hands-she has rheumatoid arthritis and osteoarthritis overlap with synovitis and synovial thickening in some of her joints.  PIP and DIP thickening was noted.  Carpal tunnel syndrome, right upper limb-she is intermittent symptoms.  Trochanteric bursitis of both hips-she is ongoing discomfort in the trochanteric region.  Primary osteoarthritis of left knee-she good range of motion with discomfort without any warmth swelling or effusion.  S/P total knee arthroplasty, right-doing well.  Primary osteoarthritis of both feet-no tenderness on palpation.  Fibromyalgia-continues to have generalized pain and discomfort from fibromyalgia.  She remains on Cymbalta , Lyrica and Percocet by pain management.  Trapezius muscle  spasm-she complains of bilateral trapezius spasm.  She requested bilateral trapezius injections.  After informed consent was obtained bilateral trapezius region was injected with lidocaine  and Kenalog  as described above.  Patient tolerated the procedure well.  Postprocedure instructions were given.  DDD (degenerative disc disease), cervical-she continues to have discomfort in limited range of motion.  Lumbar spondylosis-she gives history of increased lower back pain with left-sided radiculopathy.  April 07, 2024 MRI of the lumbar spine L5-S1 left foraminal disc extrusion with marked left neuroforaminal narrowing and compression of the exiting left L5 nerve root.  Moderate right L5-S1 neuroforaminal narrowing.  Mild spinal stenosis L3-L4 and L4-L5.  Grade 1 anterolisthesis L5-S1 by 2 mm with question bilateral L5 5 pars defect. Read by Dr. Corean Henry Patient states she was referred to a neurosurgeon.  Other insomnia-good sleep hygiene was discussed.  Other fatigue-related to fibromyalgia and insomnia.  Vitamin D deficiency  Major depressive disorder, recurrent episode, moderate (HCC)  Orders: Orders Placed This Encounter  Procedures   Trigger Point Inj   CBC with Differential/Platelet   Comprehensive metabolic panel with GFR   QuantiFERON-TB Gold Plus   No orders of the defined types were placed in this encounter.    Follow-Up Instructions: Return in about 3 months (around 07/15/2024) for Rheumatoid arthritis.   Maya Nash, MD  Note - This record has been created using Animal nutritionist.  Chart creation errors have been sought, but may not always  have been located. Such creation errors do not reflect on  the standard of medical care.     [  1]  Social History Tobacco Use   Smoking status: Never    Passive exposure: Never   Smokeless tobacco: Never  Vaping Use   Vaping status: Never Used  Substance Use Topics   Alcohol use: No   Drug use: Never   "

## 2024-04-15 ENCOUNTER — Ambulatory Visit: Admitting: Rheumatology

## 2024-04-15 ENCOUNTER — Other Ambulatory Visit (HOSPITAL_COMMUNITY): Payer: Self-pay

## 2024-04-15 DIAGNOSIS — F331 Major depressive disorder, recurrent, moderate: Secondary | ICD-10-CM

## 2024-04-15 DIAGNOSIS — M797 Fibromyalgia: Secondary | ICD-10-CM

## 2024-04-15 DIAGNOSIS — E559 Vitamin D deficiency, unspecified: Secondary | ICD-10-CM

## 2024-04-15 DIAGNOSIS — M19041 Primary osteoarthritis, right hand: Secondary | ICD-10-CM

## 2024-04-15 DIAGNOSIS — G4709 Other insomnia: Secondary | ICD-10-CM

## 2024-04-15 DIAGNOSIS — Z79899 Other long term (current) drug therapy: Secondary | ICD-10-CM

## 2024-04-15 DIAGNOSIS — M0600 Rheumatoid arthritis without rheumatoid factor, unspecified site: Secondary | ICD-10-CM

## 2024-04-15 DIAGNOSIS — R5383 Other fatigue: Secondary | ICD-10-CM

## 2024-04-15 DIAGNOSIS — M7061 Trochanteric bursitis, right hip: Secondary | ICD-10-CM

## 2024-04-15 DIAGNOSIS — M19071 Primary osteoarthritis, right ankle and foot: Secondary | ICD-10-CM

## 2024-04-15 DIAGNOSIS — M1712 Unilateral primary osteoarthritis, left knee: Secondary | ICD-10-CM

## 2024-04-15 DIAGNOSIS — Z96651 Presence of right artificial knee joint: Secondary | ICD-10-CM

## 2024-04-15 DIAGNOSIS — M62838 Other muscle spasm: Secondary | ICD-10-CM

## 2024-04-15 DIAGNOSIS — M5030 Other cervical disc degeneration, unspecified cervical region: Secondary | ICD-10-CM

## 2024-04-16 ENCOUNTER — Telehealth: Payer: Self-pay

## 2024-04-16 ENCOUNTER — Encounter: Payer: Self-pay | Admitting: Rheumatology

## 2024-04-16 ENCOUNTER — Ambulatory Visit: Attending: Rheumatology | Admitting: Rheumatology

## 2024-04-16 VITALS — BP 124/83 | HR 112 | Temp 98.0°F | Resp 17 | Ht 65.0 in | Wt 238.0 lb

## 2024-04-16 DIAGNOSIS — M62838 Other muscle spasm: Secondary | ICD-10-CM | POA: Diagnosis not present

## 2024-04-16 DIAGNOSIS — M7062 Trochanteric bursitis, left hip: Secondary | ICD-10-CM

## 2024-04-16 DIAGNOSIS — M19071 Primary osteoarthritis, right ankle and foot: Secondary | ICD-10-CM

## 2024-04-16 DIAGNOSIS — M19042 Primary osteoarthritis, left hand: Secondary | ICD-10-CM

## 2024-04-16 DIAGNOSIS — G4709 Other insomnia: Secondary | ICD-10-CM

## 2024-04-16 DIAGNOSIS — M1712 Unilateral primary osteoarthritis, left knee: Secondary | ICD-10-CM

## 2024-04-16 DIAGNOSIS — M47816 Spondylosis without myelopathy or radiculopathy, lumbar region: Secondary | ICD-10-CM | POA: Diagnosis not present

## 2024-04-16 DIAGNOSIS — Z96651 Presence of right artificial knee joint: Secondary | ICD-10-CM | POA: Diagnosis not present

## 2024-04-16 DIAGNOSIS — M797 Fibromyalgia: Secondary | ICD-10-CM

## 2024-04-16 DIAGNOSIS — M0600 Rheumatoid arthritis without rheumatoid factor, unspecified site: Secondary | ICD-10-CM

## 2024-04-16 DIAGNOSIS — Z79899 Other long term (current) drug therapy: Secondary | ICD-10-CM | POA: Diagnosis not present

## 2024-04-16 DIAGNOSIS — R5383 Other fatigue: Secondary | ICD-10-CM

## 2024-04-16 DIAGNOSIS — M7061 Trochanteric bursitis, right hip: Secondary | ICD-10-CM | POA: Diagnosis not present

## 2024-04-16 DIAGNOSIS — M19041 Primary osteoarthritis, right hand: Secondary | ICD-10-CM

## 2024-04-16 DIAGNOSIS — F331 Major depressive disorder, recurrent, moderate: Secondary | ICD-10-CM

## 2024-04-16 DIAGNOSIS — E559 Vitamin D deficiency, unspecified: Secondary | ICD-10-CM

## 2024-04-16 DIAGNOSIS — M5030 Other cervical disc degeneration, unspecified cervical region: Secondary | ICD-10-CM | POA: Diagnosis not present

## 2024-04-16 DIAGNOSIS — G5601 Carpal tunnel syndrome, right upper limb: Secondary | ICD-10-CM

## 2024-04-16 DIAGNOSIS — M19072 Primary osteoarthritis, left ankle and foot: Secondary | ICD-10-CM

## 2024-04-16 MED ORDER — TRIAMCINOLONE ACETONIDE 40 MG/ML IJ SUSP
20.0000 mg | INTRAMUSCULAR | Status: AC | PRN
  Administered 2024-04-16: 20 mg via INTRAMUSCULAR

## 2024-04-16 MED ORDER — LIDOCAINE HCL 1 % IJ SOLN
0.5000 mL | INTRAMUSCULAR | Status: AC | PRN
  Administered 2024-04-16: .5 mL

## 2024-04-16 NOTE — Telephone Encounter (Signed)
 Pt received letter stating that her Orencia  was no longer going to be covered by insurance. Will attempt to submit a PA to determine what course of action will need to happen.  Submitted a Prior Authorization request to [employer] for ORENCIA  SQ via CoverMyMeds. Will update once we receive a response.  Key: [id number]

## 2024-04-16 NOTE — Patient Instructions (Signed)
 Standing Labs We placed an order today for your standing lab work.   Please have your standing labs drawn in April and every 3 months  Please have your labs drawn 2 weeks prior to your appointment so that the provider can discuss your lab results at your appointment, if possible.  Please note that you may see your imaging and lab results in MyChart before we have reviewed them. We will contact you once all results are reviewed. Please allow our office up to 72 hours to thoroughly review all of the results before contacting the office for clarification of your results.  WALK-IN LAB HOURS  Monday through Thursday from 8:00 am - 4:30 pm and Friday from 8:00 am-12:00 pm.  Patients with office visits requiring labs will be seen before walk-in labs.  You may encounter longer than normal wait times. Please allow additional time. Wait times may be shorter on  Monday and Thursday afternoons.  We do not book appointments for walk-in labs. We appreciate your patience and understanding with our staff.   Labs are drawn by [facility]. Please bring your co-pay at the time of your lab draw.  You may receive a bill from [facility] for your lab work.  Please note if you are on Hydroxychloroquine  and and an order has been placed for a Hydroxychloroquine  level,  you will need to have it drawn 4 hours or more after your last dose.  If you wish to have your labs drawn at another location, please call the office 24 hours in advance so we can fax the orders.  The office is located at [address], [address], [place], [place] [address]   If you have any questions regarding directions or hours of operation,  please call [phone].   As a reminder, please drink plenty of water  prior to coming for your lab work. Thanks!  Vaccines You are taking a medication(s) that can suppress your immune system.  The following immunizations are recommended: Flu annually Covid-19  RSV Td/Tdap (tetanus, diphtheria, pertussis)  every 10 years Pneumonia (Prevnar 15 then Pneumovax 23 at least 1 year apart.  Alternatively, can take Prevnar 20 without needing additional dose) Shingrix: 2 doses from 4 weeks to 6 months apart  Please check with your PCP to make sure you are up to date.   If you have signs or symptoms of an infection or start antibiotics: First, call your PCP for workup of your infection. Hold your medication through the infection, until you complete your antibiotics, and until symptoms resolve if you take the following: Injectable medication (Actemra, Benlysta, Cimzia, Cosentyx, Enbrel, Humira, Kevzara, Orencia , Remicade, Simponi, Stelara, Taltz, Tremfya) Methotrexate Leflunomide  (Arava ) Mycophenolate (Cellcept) Xeljanz, Olumiant, or Rinvoq   Please get an annual skin examination to screen for skin cancer while you are on Orencia .  Please use sunscreen and sun protection.

## 2024-04-16 NOTE — Progress Notes (Signed)
 Medication Samples have been provided to the patient.  Drug name: Orencia        Strength: 125 mg         Qty: 1 LOT: [id number]  Exp.Date: Jan. 2027  Dosing instructions: Inject 125 mg into skin once weekly.

## 2024-04-17 ENCOUNTER — Other Ambulatory Visit (HOSPITAL_COMMUNITY): Payer: Self-pay

## 2024-04-17 ENCOUNTER — Ambulatory Visit: Payer: Self-pay | Admitting: Rheumatology

## 2024-04-17 NOTE — Telephone Encounter (Signed)
 Received notification from [facility] regarding a prior authorization for [employer]. Authorization has been APPROVED from 04/03/2024 to 04/02/2025. Approval letter sent to scan center.  Authorization # [id number]   Test claim reveals that pt would have a copay of $2,065.92, however her grant still appears to have left over funding and brings the copay down to $616.19. Called pt and discussed affordability options at length, after which she states that she would like to take time to think about it. Provided her with my direct phone number and urged her to reach back out to me if she has any additional questions/concerns or if she comes to a final decision on how she would like to proceed. She verbalized understanding to all.

## 2024-04-17 NOTE — Progress Notes (Signed)
 MCV is low.  Patient should take multivitamin with iron.  CMP is normal.

## 2024-04-19 LAB — COMPREHENSIVE METABOLIC PANEL WITH GFR
AG Ratio: 1.8 (calc) (ref 1.0–2.5)
ALT: 20 U/L (ref 6–29)
AST: 18 U/L (ref 10–35)
Albumin: 4 g/dL (ref 3.6–5.1)
Alkaline phosphatase (APISO): 122 U/L (ref 37–153)
BUN: 9 mg/dL (ref 7–25)
CO2: 27 mmol/L (ref 20–32)
Calcium: 9.2 mg/dL (ref 8.6–10.4)
Chloride: 105 mmol/L (ref 98–110)
Creat: 0.93 mg/dL (ref 0.50–1.03)
Globulin: 2.2 g/dL (ref 1.9–3.7)
Glucose, Bld: 94 mg/dL (ref 65–99)
Potassium: 3.9 mmol/L (ref 3.5–5.3)
Sodium: 139 mmol/L (ref 135–146)
Total Bilirubin: 0.4 mg/dL (ref 0.2–1.2)
Total Protein: 6.2 g/dL (ref 6.1–8.1)
eGFR: 73 mL/min/1.73m2

## 2024-04-19 LAB — QUANTIFERON-TB GOLD PLUS
Mitogen-NIL: >10 [IU]/mL
NIL: 0.04 [IU]/mL
QuantiFERON-TB Gold Plus: NEGATIVE
TB1-NIL: <0 [IU]/mL
TB2-NIL: <0 [IU]/mL

## 2024-04-19 LAB — CBC WITH DIFFERENTIAL/PLATELET
Absolute Lymphocytes: 2767 {cells}/uL (ref 850–3900)
Absolute Monocytes: 523 {cells}/uL (ref 200–950)
Basophils Absolute: 47 {cells}/uL (ref 0–200)
Basophils Relative: 0.7 %
Eosinophils Absolute: 67 {cells}/uL (ref 15–500)
Eosinophils Relative: 1 %
HCT: 38.4 % (ref 35.9–46.0)
Hemoglobin: 12 g/dL (ref 11.7–15.5)
MCH: 24 pg — ABNORMAL LOW (ref 27.0–33.0)
MCHC: 31.3 g/dL — ABNORMAL LOW (ref 31.6–35.4)
MCV: 76.8 fL — ABNORMAL LOW (ref 81.4–101.7)
MPV: 10.1 fL (ref 7.5–12.5)
Monocytes Relative: 7.8 %
Neutro Abs: 3296 {cells}/uL (ref 1500–7800)
Neutrophils Relative %: 49.2 %
Platelets: 370 Thousand/uL (ref 140–400)
RBC: 5 Million/uL (ref 3.80–5.10)
RDW: 17 % — ABNORMAL HIGH (ref 11.0–15.0)
Total Lymphocyte: 41.3 %
WBC: 6.7 Thousand/uL (ref 3.8–10.8)

## 2024-04-21 ENCOUNTER — Other Ambulatory Visit: Payer: Self-pay | Admitting: Pharmacy Technician

## 2024-04-21 ENCOUNTER — Other Ambulatory Visit: Payer: Self-pay

## 2024-04-22 ENCOUNTER — Other Ambulatory Visit: Payer: Self-pay

## 2024-04-23 ENCOUNTER — Other Ambulatory Visit: Payer: Self-pay | Admitting: Physician Assistant

## 2024-04-23 ENCOUNTER — Other Ambulatory Visit (HOSPITAL_COMMUNITY): Payer: Self-pay

## 2024-04-23 DIAGNOSIS — M0600 Rheumatoid arthritis without rheumatoid factor, unspecified site: Secondary | ICD-10-CM

## 2024-04-23 NOTE — Telephone Encounter (Signed)
 Last Fill: 03/24/2024  Labs: 04/16/2024 MCV is low. Patient should take multivitamin with iron. CMP is normal.   Next Visit: 07/16/2024  Last Visit: 04/16/2024  DX: Seronegative rheumatoid arthritis (HCC)   Current Dose per office note 04/16/2024: Arava  20 mg 1 tablet by mouth daily   Okay to refill Arava  ?

## 2024-04-25 ENCOUNTER — Other Ambulatory Visit: Payer: Self-pay

## 2024-04-29 NOTE — Progress Notes (Signed)
 Pausing enrollment while pt is considering how she would like to proceed.

## 2024-05-01 ENCOUNTER — Ambulatory Visit: Admitting: Physician Assistant

## 2024-07-16 ENCOUNTER — Ambulatory Visit: Admitting: Physician Assistant
